# Patient Record
Sex: Female | Born: 1965
Health system: Southern US, Community
[De-identification: ages and names within clinical notes are randomized; demographics above are authoritative.]

## PROBLEM LIST (undated history)

## (undated) DIAGNOSIS — I83813 Varicose veins of bilateral lower extremities with pain: Secondary | ICD-10-CM

## (undated) DIAGNOSIS — L509 Urticaria, unspecified: Secondary | ICD-10-CM

## (undated) DIAGNOSIS — R0981 Nasal congestion: Secondary | ICD-10-CM

## (undated) DIAGNOSIS — T783XXA Angioneurotic edema, initial encounter: Secondary | ICD-10-CM

## (undated) DIAGNOSIS — E039 Hypothyroidism, unspecified: Secondary | ICD-10-CM

## (undated) DIAGNOSIS — K219 Gastro-esophageal reflux disease without esophagitis: Secondary | ICD-10-CM

## (undated) DIAGNOSIS — Z972 Presence of dental prosthetic device (complete) (partial): Secondary | ICD-10-CM

## (undated) DIAGNOSIS — K08109 Complete loss of teeth, unspecified cause, unspecified class: Secondary | ICD-10-CM

## (undated) DIAGNOSIS — Z8719 Personal history of other diseases of the digestive system: Secondary | ICD-10-CM

## (undated) DIAGNOSIS — I493 Ventricular premature depolarization: Secondary | ICD-10-CM

## (undated) DIAGNOSIS — N393 Stress incontinence (female) (male): Secondary | ICD-10-CM

## (undated) DIAGNOSIS — K859 Acute pancreatitis without necrosis or infection, unspecified: Secondary | ICD-10-CM

## (undated) DIAGNOSIS — J449 Chronic obstructive pulmonary disease, unspecified: Secondary | ICD-10-CM

## (undated) DIAGNOSIS — Z973 Presence of spectacles and contact lenses: Secondary | ICD-10-CM

## (undated) DIAGNOSIS — K8020 Calculus of gallbladder without cholecystitis without obstruction: Secondary | ICD-10-CM

## (undated) HISTORY — DX: Varicose veins of bilateral lower extremities with pain: I83.813

## (undated) HISTORY — PX: BREAST LUMPECTOMY: SHX2

## (undated) HISTORY — PX: BREAST EXCISIONAL BIOPSY: SUR124

## (undated) HISTORY — DX: Angioneurotic edema, initial encounter: T78.3XXA

## (undated) HISTORY — DX: Urticaria, unspecified: L50.9

## (undated) HISTORY — PX: TUBAL LIGATION: SHX77

## (undated) HISTORY — PX: BREAST BIOPSY: SHX20

## (undated) HISTORY — DX: Ventricular premature depolarization: I49.3

---

## 1997-11-27 ENCOUNTER — Other Ambulatory Visit: Admission: RE | Admit: 1997-11-27 | Discharge: 1997-11-27 | Payer: Self-pay | Admitting: *Deleted

## 1998-01-15 ENCOUNTER — Ambulatory Visit (HOSPITAL_BASED_OUTPATIENT_CLINIC_OR_DEPARTMENT_OTHER): Admission: RE | Admit: 1998-01-15 | Discharge: 1998-01-15 | Payer: Self-pay | Admitting: Surgery

## 1998-10-27 ENCOUNTER — Ambulatory Visit (HOSPITAL_COMMUNITY): Admission: RE | Admit: 1998-10-27 | Discharge: 1998-10-27 | Payer: Self-pay | Admitting: Internal Medicine

## 1998-10-28 ENCOUNTER — Encounter: Payer: Self-pay | Admitting: Internal Medicine

## 1999-01-01 ENCOUNTER — Other Ambulatory Visit: Admission: RE | Admit: 1999-01-01 | Discharge: 1999-01-01 | Payer: Self-pay | Admitting: Obstetrics and Gynecology

## 1999-02-10 ENCOUNTER — Ambulatory Visit (HOSPITAL_COMMUNITY): Admission: RE | Admit: 1999-02-10 | Discharge: 1999-02-10 | Payer: Self-pay | Admitting: *Deleted

## 1999-03-29 ENCOUNTER — Other Ambulatory Visit: Admission: RE | Admit: 1999-03-29 | Discharge: 1999-03-29 | Payer: Self-pay | Admitting: Urology

## 1999-05-01 ENCOUNTER — Emergency Department (HOSPITAL_COMMUNITY): Admission: EM | Admit: 1999-05-01 | Discharge: 1999-05-01 | Payer: Self-pay | Admitting: Emergency Medicine

## 1999-05-02 ENCOUNTER — Emergency Department (HOSPITAL_COMMUNITY): Admission: EM | Admit: 1999-05-02 | Discharge: 1999-05-02 | Payer: Self-pay | Admitting: *Deleted

## 1999-06-07 ENCOUNTER — Emergency Department (HOSPITAL_COMMUNITY): Admission: EM | Admit: 1999-06-07 | Discharge: 1999-06-07 | Payer: Self-pay

## 1999-06-29 ENCOUNTER — Ambulatory Visit (HOSPITAL_COMMUNITY): Admission: RE | Admit: 1999-06-29 | Discharge: 1999-06-29 | Payer: Self-pay | Admitting: *Deleted

## 1999-07-05 ENCOUNTER — Ambulatory Visit (HOSPITAL_COMMUNITY): Admission: RE | Admit: 1999-07-05 | Discharge: 1999-07-05 | Payer: Self-pay | Admitting: *Deleted

## 1999-08-14 ENCOUNTER — Emergency Department (HOSPITAL_COMMUNITY): Admission: EM | Admit: 1999-08-14 | Discharge: 1999-08-14 | Payer: Self-pay | Admitting: Emergency Medicine

## 1999-09-15 ENCOUNTER — Encounter: Payer: Self-pay | Admitting: Obstetrics and Gynecology

## 1999-09-15 ENCOUNTER — Encounter: Admission: RE | Admit: 1999-09-15 | Discharge: 1999-09-15 | Payer: Self-pay | Admitting: Obstetrics and Gynecology

## 2000-01-27 ENCOUNTER — Other Ambulatory Visit: Admission: RE | Admit: 2000-01-27 | Discharge: 2000-01-27 | Payer: Self-pay | Admitting: *Deleted

## 2000-05-13 ENCOUNTER — Ambulatory Visit (HOSPITAL_COMMUNITY): Admission: RE | Admit: 2000-05-13 | Discharge: 2000-05-13 | Payer: Self-pay

## 2000-09-22 ENCOUNTER — Encounter: Admission: RE | Admit: 2000-09-22 | Discharge: 2000-09-22 | Payer: Self-pay | Admitting: Obstetrics and Gynecology

## 2000-09-22 ENCOUNTER — Encounter: Payer: Self-pay | Admitting: Obstetrics and Gynecology

## 2001-04-12 ENCOUNTER — Other Ambulatory Visit: Admission: RE | Admit: 2001-04-12 | Discharge: 2001-04-12 | Payer: Self-pay | Admitting: Obstetrics and Gynecology

## 2001-11-13 ENCOUNTER — Encounter (INDEPENDENT_AMBULATORY_CARE_PROVIDER_SITE_OTHER): Payer: Self-pay | Admitting: Specialist

## 2001-11-13 ENCOUNTER — Observation Stay (HOSPITAL_COMMUNITY): Admission: RE | Admit: 2001-11-13 | Discharge: 2001-11-14 | Payer: Self-pay | Admitting: Obstetrics and Gynecology

## 2001-11-13 HISTORY — PX: VAGINAL HYSTERECTOMY: SHX2639

## 2003-04-22 ENCOUNTER — Encounter: Payer: Self-pay | Admitting: Internal Medicine

## 2003-04-22 ENCOUNTER — Encounter: Admission: RE | Admit: 2003-04-22 | Discharge: 2003-04-22 | Payer: Self-pay | Admitting: Internal Medicine

## 2003-04-28 ENCOUNTER — Encounter: Admission: RE | Admit: 2003-04-28 | Discharge: 2003-04-28 | Payer: Self-pay | Admitting: Internal Medicine

## 2003-04-28 ENCOUNTER — Encounter: Payer: Self-pay | Admitting: Internal Medicine

## 2003-05-27 ENCOUNTER — Encounter: Payer: Self-pay | Admitting: Internal Medicine

## 2003-05-27 ENCOUNTER — Encounter: Admission: RE | Admit: 2003-05-27 | Discharge: 2003-05-27 | Payer: Self-pay | Admitting: Internal Medicine

## 2004-07-26 ENCOUNTER — Encounter: Admission: RE | Admit: 2004-07-26 | Discharge: 2004-07-26 | Payer: Self-pay | Admitting: Internal Medicine

## 2006-11-28 ENCOUNTER — Encounter: Admission: RE | Admit: 2006-11-28 | Discharge: 2006-11-28 | Payer: Self-pay | Admitting: Internal Medicine

## 2006-12-05 ENCOUNTER — Encounter: Admission: RE | Admit: 2006-12-05 | Discharge: 2006-12-05 | Payer: Self-pay | Admitting: Internal Medicine

## 2006-12-08 ENCOUNTER — Encounter: Admission: RE | Admit: 2006-12-08 | Discharge: 2006-12-08 | Payer: Self-pay | Admitting: Internal Medicine

## 2006-12-08 ENCOUNTER — Encounter (INDEPENDENT_AMBULATORY_CARE_PROVIDER_SITE_OTHER): Payer: Self-pay | Admitting: Specialist

## 2007-11-29 ENCOUNTER — Encounter: Admission: RE | Admit: 2007-11-29 | Discharge: 2007-11-29 | Payer: Self-pay | Admitting: Internal Medicine

## 2008-05-02 ENCOUNTER — Ambulatory Visit (HOSPITAL_COMMUNITY): Admission: RE | Admit: 2008-05-02 | Discharge: 2008-05-02 | Payer: Self-pay | Admitting: Urology

## 2008-12-17 ENCOUNTER — Encounter: Admission: RE | Admit: 2008-12-17 | Discharge: 2008-12-17 | Payer: Self-pay | Admitting: Internal Medicine

## 2010-08-06 ENCOUNTER — Encounter
Admission: RE | Admit: 2010-08-06 | Discharge: 2010-08-06 | Payer: Self-pay | Source: Home / Self Care | Attending: Family Medicine | Admitting: Family Medicine

## 2010-08-30 ENCOUNTER — Encounter: Payer: Self-pay | Admitting: Internal Medicine

## 2010-12-24 NOTE — Op Note (Signed)
[facility]  Patient:    Dana Martin, Dana Martin Visit Number: [id number] MRN: [id number]          Service Type: DSU Location: [id number] Attending Physician:  Jenean Lindau Dictated by:   Laqueta Linden, M.D. Proc. Date: 11/13/01 Admit Date:  11/13/2001 Discharge Date: 11/14/2001                             Operative Report  PREOPERATIVE DIAGNOSIS:       Leiomyoma uteri, menorrhagia, rule out                               adenomyosis.  POSTOPERATIVE DIAGNOSIS:      Leiomyoma uteri, menorrhagia, rule out                               adenomyosis.  OPERATION:                    Transvaginal hysterectomy.  SURGEON:                      Laqueta Linden, M.D.  ASSISTANT:                    Andres Ege, M.D.  ANESTHESIA:                   General LMA.  ESTIMATED BLOOD LOSS:         Less than 50 cc.  URINE OUTPUT:                 65 cc of clear urine and in and out                               catheterization at the conclusion of the                               procedure.  COUNTS:                       Correct x2.  COMPLICATIONS:                None.  INDICATIONS:                  The patient is a 45 year old gravida 2, para 2, married white female with worsening menorrhagia.  She reports that her menses have been lasting eight to nine days which are profuse for three of those days, changing an overnight pad every two hours during the day and saturating these pads.  She wears two pads at bed time and soaks through to her bed clothes.  Evaluation has included a pelvic ultrasound revealed a 5 cm fundal fibroid with normal-appearing ovaries.  Sonohistogram does not reveal a submucosal component of the fibroid that might be amenable to conservative resection.  There is a small synechiae with no evidence of polyps.  She has tried the progesterone only pill with no response in her bleeding pattern. She also reports worsening pelvic pain  with her menses.  She is not a candidate for combination pills as she is a smoker.  Alternative management has  been discussed including uterine artery embolization, endometrial ablation by various methods versus vaginal hysterectomy.  She elected to proceed to hysterectomy as she is complaining of worsening pelvic pressure suprapubically from the myoma.  She has seen the informed consent film and voiced her understanding of all risks, benefits, alternatives, and complications, and agrees to proceed.  Full consent has been given.  She has received Ancef 1 g IV antibiotic prophylaxis preoperatively.  DESCRIPTION OF PROCEDURE:     The patient was taken to the operating room and after proper identification and consent were ascertained, she was placed on the operating table in the supine position.  After the induction of general LMA, she was placed in the candy cane stirrups, and the perineum and vagina were prepped and draped in the routine sterile fashion.  The bladder was emptied with a red rubber catheter.  A weighted speculum was placed in the posterior vagina.  The cervix was grasped with a single tooth tenaculum with a minimal amount of descent noted.  The portio was then injected with a 1:100,000 solution of epinephrine circumferentially.  The cervix was then circumscribed with the scalpel.  The vaginal mucosa was then bluntly advanced off of the cervix.  The posterior mucosa was then tinted down and the cul-de-sac entered sharply using curved Mayo scissors.  The uterosacral ligaments were clamped, cut, and Heaney ligated with 0 Vicryl suture and tagged.  Cardinal ligaments with included vessels were similarly clamped, cut, and suture ligated.  At this point, improved descent of the cervical uterine complex was noted. The anterior peritoneal reflection was identified and entered sharply without obvious injury or entry into the bladder.  A retractor was placed anteriorly to retract the  bladder out of the operative field.  Uterine vessels were then clamped bilaterally with pedicles incorporating both anterior and posterior peritoneum with pedicles cut and suture ligated.  At this point, an attempt was made to flip the uterus posteriorly.  This was hindered by the large fundal myoma.  As the uterine vasculature had been ligated, the cervix was then cored out of the uterus and transected which decreased the overall bulk of the uterine fundus.  At this point, the fundus with contained fibroid was delivered posteriorly. The proximal adnexal pedicles were then clamped and cut with removal of the remainder of the specimen which was sent in its entirety to pathology.  At this point, the adnexal pedicles were triply ligated with two free ties and a stitch of 0 Vicryl.  Both tubes and ovaries appeared completely normal.  The pedicles were noted to be hemostatic and were released in the peritoneal cavity.  At this point, the posterior vaginal cuff was reefed to the peritoneum, taking care to avoid the rectum which was in somewhat close proximity to this closure.  Care was taken to only take small superficial areas of peritoneum, so as not to enter the rectum.  This closure improved hemostasis.  At this point, the parietal peritoneum was closed in a pursestring fashion using 2-0 Vicryl suture.  All pedicles were exteriorized.  Hemostasis was noted to be excellent.  Counts were correct prior to closing the peritoneum. The uterosacral tags were then tied in the midline.  Upper vaginal support was noted to be excellent.  The vaginal cuff was then closed from side-to-side using interrupted figure-of-eight sutures of 0 Vicryl.  Hemostasis was noted to be excellent.  The bladder was then emptied of 65 cc of clear urine which had accumulated during the less  than one hour procedure.  This was clear urine.  The cuff was hemostatic.  No packing was left in place.  The patient was  returned to the supine position.  She was extubated and stable on transfer to the recovery room.  Estimated blood loss 50 cc or less.  Urine  output 65 cc.  Counts correct x2.  Complications none. Dictated by:   Laqueta Linden, M.D. Attending Physician:  Jenean Lindau DD:  11/13/01 TD:  11/13/01 Job: [id number]

## 2011-04-19 ENCOUNTER — Emergency Department (HOSPITAL_COMMUNITY): Payer: Self-pay

## 2011-04-19 ENCOUNTER — Emergency Department (HOSPITAL_COMMUNITY)
Admission: EM | Admit: 2011-04-19 | Discharge: 2011-04-19 | Disposition: A | Discharge status: Home / Self Care | Payer: Self-pay | Attending: Emergency Medicine | Admitting: Emergency Medicine

## 2011-04-19 DIAGNOSIS — R1013 Epigastric pain: Secondary | ICD-10-CM | POA: Insufficient documentation

## 2011-04-19 DIAGNOSIS — K219 Gastro-esophageal reflux disease without esophagitis: Secondary | ICD-10-CM | POA: Insufficient documentation

## 2011-04-19 DIAGNOSIS — R1011 Right upper quadrant pain: Secondary | ICD-10-CM | POA: Insufficient documentation

## 2011-04-19 DIAGNOSIS — K859 Acute pancreatitis without necrosis or infection, unspecified: Secondary | ICD-10-CM | POA: Insufficient documentation

## 2011-04-19 DIAGNOSIS — E0500 Thyrotoxicosis with diffuse goiter without thyrotoxic crisis or storm: Secondary | ICD-10-CM | POA: Insufficient documentation

## 2011-04-19 LAB — DIFFERENTIAL
Basophils Absolute: 0.1 K/uL (ref 0.0–0.1)
Basophils Relative: 0 % (ref 0–1)
Eosinophils Absolute: 0.2 K/uL (ref 0.0–0.7)
Eosinophils Relative: 2 % (ref 0–5)
Lymphocytes Relative: 19 % (ref 12–46)
Lymphs Abs: 2.3 10*3/uL (ref 0.7–4.0)
Monocytes Absolute: 1 10*3/uL (ref 0.1–1.0)
Monocytes Relative: 8 % (ref 3–12)
Neutro Abs: 8.7 10*3/uL — ABNORMAL HIGH (ref 1.7–7.7)
Neutrophils Relative %: 71 % (ref 43–77)

## 2011-04-19 LAB — URINALYSIS, ROUTINE W REFLEX MICROSCOPIC
Glucose, UA: NEGATIVE mg/dL
Ketones, ur: 40 mg/dL — AB
Leukocytes, UA: NEGATIVE
Nitrite: NEGATIVE
Urobilinogen, UA: 1 mg/dL (ref 0.0–1.0)
pH: 6.5 (ref 5.0–8.0)

## 2011-04-19 LAB — CBC
HCT: 35.2 % — ABNORMAL LOW (ref 36.0–46.0)
Hemoglobin: 11.6 g/dL — ABNORMAL LOW (ref 12.0–15.0)
MCH: 27.8 pg (ref 26.0–34.0)
MCHC: 33 g/dL (ref 30.0–36.0)
MCV: 84.2 fL (ref 78.0–100.0)
Platelets: 162 10*3/uL (ref 150–400)
RBC: 4.18 MIL/uL (ref 3.87–5.11)
RDW: 14.7 % (ref 11.5–15.5)
WBC: 12.3 10*3/uL — ABNORMAL HIGH (ref 4.0–10.5)

## 2011-04-19 LAB — COMPREHENSIVE METABOLIC PANEL WITH GFR
BUN: 5 mg/dL — ABNORMAL LOW (ref 6–23)
Chloride: 99 meq/L (ref 96–112)
Sodium: 136 meq/L (ref 135–145)
Total Bilirubin: 0.4 mg/dL (ref 0.3–1.2)
Total Protein: 7.6 g/dL (ref 6.0–8.3)

## 2011-04-19 LAB — COMPREHENSIVE METABOLIC PANEL
ALT: 11 U/L (ref 0–35)
AST: 14 U/L (ref 0–37)
Albumin: 3.3 g/dL — ABNORMAL LOW (ref 3.5–5.2)
Alkaline Phosphatase: 73 U/L (ref 39–117)
CO2: 28 mEq/L (ref 19–32)
Calcium: 9.3 mg/dL (ref 8.4–10.5)
Creatinine, Ser: <0.47 mg/dL — ABNORMAL LOW (ref 0.50–1.10)
Glucose, Bld: 71 mg/dL (ref 70–99)
Potassium: 3.7 mEq/L (ref 3.5–5.1)

## 2011-04-19 LAB — URINE MICROSCOPIC-ADD ON

## 2011-04-19 LAB — POCT PREGNANCY, URINE: Preg Test, Ur: NEGATIVE

## 2011-04-19 LAB — LIPASE, BLOOD: Lipase: 84 U/L — ABNORMAL HIGH (ref 11–59)

## 2012-03-15 ENCOUNTER — Emergency Department (HOSPITAL_COMMUNITY)
Admission: EM | Admit: 2012-03-15 | Discharge: 2012-03-15 | Disposition: A | Discharge status: Home / Self Care | Payer: 59 | Attending: Emergency Medicine | Admitting: Emergency Medicine

## 2012-03-15 ENCOUNTER — Encounter (HOSPITAL_COMMUNITY): Payer: Self-pay | Admitting: Emergency Medicine

## 2012-03-15 DIAGNOSIS — L259 Unspecified contact dermatitis, unspecified cause: Secondary | ICD-10-CM

## 2012-03-15 DIAGNOSIS — M542 Cervicalgia: Secondary | ICD-10-CM | POA: Insufficient documentation

## 2012-03-15 DIAGNOSIS — E0500 Thyrotoxicosis with diffuse goiter without thyrotoxic crisis or storm: Secondary | ICD-10-CM | POA: Insufficient documentation

## 2012-03-15 DIAGNOSIS — Z87891 Personal history of nicotine dependence: Secondary | ICD-10-CM | POA: Insufficient documentation

## 2012-03-15 DIAGNOSIS — Z79899 Other long term (current) drug therapy: Secondary | ICD-10-CM | POA: Insufficient documentation

## 2012-03-15 DIAGNOSIS — S161XXA Strain of muscle, fascia and tendon at neck level, initial encounter: Secondary | ICD-10-CM

## 2012-03-15 MED ORDER — CYCLOBENZAPRINE HCL 5 MG PO TABS: 5.0000 mg | ORAL_TABLET | Freq: Three times a day (TID) | ORAL | Status: AC | PRN

## 2012-03-15 MED ORDER — HYDROCODONE-ACETAMINOPHEN 5-325 MG PO TABS: ORAL_TABLET | ORAL | Status: AC

## 2012-03-15 MED ORDER — PREDNISONE 10 MG PO TABS: ORAL_TABLET | ORAL | Status: DC

## 2012-03-15 NOTE — ED Notes (Signed)
Neck felt like "pinched nerve" started Friday night. Went to Chiropractor Monday, Tuesday, Wednesday, had xray done. Rash started Tuesday on mid chest- blistery looking- denies being exposed to poison ivy. Rash doesn't itch- doesn't hurt.

## 2012-03-15 NOTE — ED Provider Notes (Signed)
History     CSN: [id number]  Arrival date & time 03/15/12  4098   First MD Initiated Contact with Patient 03/15/12 1004      Chief Complaint  Patient presents with  . Neck Pain  . rash on neck     HPI Pt was seen at 1010.  Per pt, c/o gradual onset and persistence of constant L>R sided neck "pain" for the past 1 week.  Pt states the pain began after she was "pulling a heavy couch." Pt was eval by her Chiropractor this past week for same, told she had a "pinched nerve."  States she came to the ED for a pain meds rx because she has been unable to get an appt with her PMD for it.  Pt also c/o localized "red rash" to her anterior chest that began 2 days ago "after the dog jumped up on me after being outside."  Pt denies any other areas of rash, no fevers, no CP/SOB.  Denies headache, no visual changes, no focal motor deficits, no tingling/numbness in extremities.     Past Medical History  Diagnosis Date  . Graves disease     Past Surgical History  Procedure Date  . Abdominal hysterectomy   . Breast lumpectomy     benign -- fibroid    History  Substance Use Topics  . Smoking status: Former Games developer  . Smokeless tobacco: Not on file  . Alcohol Use:      occasionally    Review of Systems ROS: Statement: All systems negative except as marked or noted in the HPI; Constitutional: Negative for fever and chills. ; ; Eyes: Negative for eye pain, redness and discharge. ; ; ENMT: Negative for ear pain, hoarseness, nasal congestion, sinus pressure and sore throat. ; ; Cardiovascular: Negative for chest pain, palpitations, diaphoresis, dyspnea and peripheral edema. ; ; Respiratory: Negative for cough, wheezing and stridor. ; ; Gastrointestinal: Negative for nausea, vomiting, diarrhea, abdominal pain, blood in stool, hematemesis, jaundice and rectal bleeding.; ; Genitourinary: Negative for dysuria, flank pain and hematuria. ; ; Musculoskeletal: +neck pain. Negative for back pain. Negative for  swelling and trauma.; ; Skin: +rash. Negative for pruritus, abrasions, blisters, bruising and skin lesion.; ; Neuro: Negative for headache, lightheadedness and neck stiffness. Negative for weakness, altered level of consciousness , altered mental status, extremity weakness, paresthesias, involuntary movement, seizure and syncope.     Allergies  Review of patient's allergies indicates no known allergies.  Home Medications   Current Outpatient Rx  Name Route Sig Dispense Refill  . CETIRIZINE HCL 10 MG PO TABS Oral Take 10 mg by mouth daily.    Marland Kitchen LANSOPRAZOLE 30 MG PO CPDR Oral Take 30 mg by mouth daily.    Marland Kitchen LEVOTHYROXINE SODIUM 175 MCG PO TABS Oral Take 175 mcg by mouth daily.      BP 150/103  Pulse 96  Temp 97 F (36.1 C)  Resp 18  SpO2 100%  Physical Exam 1015: Physical examination:  Nursing notes reviewed; Vital signs and O2 SAT reviewed;  Constitutional: Well developed, Well nourished, Well hydrated, In no acute distress; Head:  Normocephalic, atraumatic; Eyes: EOMI, PERRL, No scleral icterus; ENMT: Mouth and pharynx normal, Mucous membranes moist; Neck: Supple, Full range of motion, No lymphadenopathy; Cardiovascular: Regular rate and rhythm, No murmur, rub, or gallop; Respiratory: Breath sounds clear & equal bilaterally, No rales, rhonchi, wheezes.  Speaking full sentences with ease, Normal respiratory effort/excursion; Chest: Nontender, Movement normal; Spine:  No midline CS, TS, LS  tenderness.  +TTP left hypertonic trapezius muscle; Extremities: Pulses normal, No tenderness, No edema, No calf edema or asymmetry.; Neuro: AA&Ox3, Major CN grossly intact.  Speech clear. Grips equal.  Strength 5/5 bilat UE's.  DTR 2/4 equal bilat UE's. Gait steady. No gross focal motor or sensory deficits in extremities.; Skin: Color normal, Warm, Dry, +erythemetous maculopapular rash to upper chest wall, no drainage, no crusting, no vesicles.    ED Course  Procedures    MDM  MDM Reviewed:  nursing note, vitals and previous chart Reviewed previous: MRI     1020:  Previous MRI CS from 2001 with with mild disc bulge.  No gross focal motor or sensory weakness on neuro exam (esp bilat UE's), visual changes, or headache to suggest arterial injury from manipulation or acute herniated disk.  No indication for repeat imaging at this time, will tx symptomatically.  Rash began after pet came in from outside and jumped up on her, will tx for contact dermatitis.  Dx d/w pt.  Questions answered.  Verb understanding, agreeable to d/c home with outpt f/u.      No midlevel was involved in the care of this pt. Laylamarie Meuser Allison Quarry, DO 03/17/12 1524

## 2013-05-08 ENCOUNTER — Emergency Department (HOSPITAL_COMMUNITY): Payer: 59

## 2013-05-08 ENCOUNTER — Inpatient Hospital Stay (HOSPITAL_COMMUNITY)
Admission: EM | Admit: 2013-05-08 | Discharge: 2013-05-10 | DRG: 439 | Disposition: A | Discharge status: Home / Self Care | Payer: 59 | Attending: Internal Medicine | Admitting: Internal Medicine

## 2013-05-08 ENCOUNTER — Encounter (HOSPITAL_COMMUNITY): Payer: Self-pay | Admitting: Emergency Medicine

## 2013-05-08 DIAGNOSIS — E871 Hypo-osmolality and hyponatremia: Secondary | ICD-10-CM | POA: Diagnosis present

## 2013-05-08 DIAGNOSIS — K219 Gastro-esophageal reflux disease without esophagitis: Secondary | ICD-10-CM | POA: Diagnosis present

## 2013-05-08 DIAGNOSIS — E039 Hypothyroidism, unspecified: Secondary | ICD-10-CM | POA: Diagnosis present

## 2013-05-08 DIAGNOSIS — F17200 Nicotine dependence, unspecified, uncomplicated: Secondary | ICD-10-CM | POA: Diagnosis present

## 2013-05-08 DIAGNOSIS — D72829 Elevated white blood cell count, unspecified: Secondary | ICD-10-CM

## 2013-05-08 DIAGNOSIS — K859 Acute pancreatitis without necrosis or infection, unspecified: Principal | ICD-10-CM | POA: Diagnosis present

## 2013-05-08 DIAGNOSIS — D696 Thrombocytopenia, unspecified: Secondary | ICD-10-CM | POA: Diagnosis present

## 2013-05-08 HISTORY — DX: Acute pancreatitis without necrosis or infection, unspecified: K85.90

## 2013-05-08 LAB — CBC WITH DIFFERENTIAL/PLATELET
MCHC: 33.1 g/dL (ref 30.0–36.0)
MCV: 82.9 fL (ref 78.0–100.0)
RDW: 15.2 % (ref 11.5–15.5)

## 2013-05-08 LAB — POCT I-STAT, CHEM 8
BUN: 5 mg/dL — ABNORMAL LOW (ref 6–23)
Creatinine, Ser: 0.7 mg/dL (ref 0.50–1.10)
Glucose, Bld: 100 mg/dL — ABNORMAL HIGH (ref 70–99)
Sodium: 139 mEq/L (ref 135–145)
TCO2: 28 mmol/L (ref 0–100)

## 2013-05-08 LAB — URINALYSIS, ROUTINE W REFLEX MICROSCOPIC
Bilirubin Urine: NEGATIVE
Protein, ur: NEGATIVE mg/dL
Specific Gravity, Urine: 1.026 (ref 1.005–1.030)
Urobilinogen, UA: 1 mg/dL (ref 0.0–1.0)
pH: 6 (ref 5.0–8.0)

## 2013-05-08 LAB — URINE MICROSCOPIC-ADD ON

## 2013-05-08 LAB — HEPATIC FUNCTION PANEL
ALT: 10 U/L (ref 0–35)
Albumin: 3.9 g/dL (ref 3.5–5.2)
Total Protein: 8 g/dL (ref 6.0–8.3)

## 2013-05-08 LAB — LIPASE, BLOOD: Lipase: 24 U/L (ref 11–59)

## 2013-05-08 MED ORDER — POLYETHYLENE GLYCOL 3350 17 G PO PACK
17.0000 g | PACK | Freq: Every day | ORAL | Status: DC | PRN
  Filled 2013-05-08: qty 1

## 2013-05-08 MED ORDER — ONDANSETRON HCL 4 MG/2ML IJ SOLN: 4.0000 mg | Freq: Four times a day (QID) | INTRAMUSCULAR | Status: DC | PRN

## 2013-05-08 MED ORDER — ACETAMINOPHEN 650 MG RE SUPP: 650.0000 mg | Freq: Four times a day (QID) | RECTAL | Status: DC | PRN

## 2013-05-08 MED ORDER — PANTOPRAZOLE SODIUM 40 MG PO TBEC
40.0000 mg | DELAYED_RELEASE_TABLET | Freq: Every day | ORAL | Status: DC
  Administered 2013-05-09 – 2013-05-10 (×2): 40 mg via ORAL
  Filled 2013-05-08 (×2): qty 1

## 2013-05-08 MED ORDER — SODIUM CHLORIDE 0.9 % IV BOLUS (SEPSIS)
1000.0000 mL | Freq: Once | INTRAVENOUS | Status: AC
  Administered 2013-05-08: 1000 mL via INTRAVENOUS

## 2013-05-08 MED ORDER — HYDROMORPHONE HCL PF 1 MG/ML IJ SOLN: 1.0000 mg | INTRAMUSCULAR | Status: DC | PRN

## 2013-05-08 MED ORDER — DOCUSATE SODIUM 100 MG PO CAPS
100.0000 mg | ORAL_CAPSULE | Freq: Two times a day (BID) | ORAL | Status: DC
  Administered 2013-05-09 – 2013-05-10 (×3): 100 mg via ORAL
  Filled 2013-05-08 (×5): qty 1

## 2013-05-08 MED ORDER — ONDANSETRON HCL 4 MG/2ML IJ SOLN
4.0000 mg | Freq: Once | INTRAMUSCULAR | Status: DC
  Filled 2013-05-08: qty 2

## 2013-05-08 MED ORDER — SORBITOL 70 % SOLN
30.0000 mL | Freq: Every day | Status: DC | PRN
  Filled 2013-05-08: qty 30

## 2013-05-08 MED ORDER — SODIUM CHLORIDE 0.9 % IV SOLN
INTRAVENOUS | Status: DC
  Administered 2013-05-08: 22:00:00 via INTRAVENOUS
  Administered 2013-05-10: 1000 mL via INTRAVENOUS

## 2013-05-08 MED ORDER — ONDANSETRON HCL 4 MG/2ML IJ SOLN: 4.0000 mg | Freq: Three times a day (TID) | INTRAMUSCULAR | Status: DC | PRN

## 2013-05-08 MED ORDER — ONDANSETRON HCL 4 MG PO TABS: 4.0000 mg | ORAL_TABLET | Freq: Four times a day (QID) | ORAL | Status: DC | PRN

## 2013-05-08 MED ORDER — ACETAMINOPHEN 325 MG PO TABS: 650.0000 mg | ORAL_TABLET | Freq: Four times a day (QID) | ORAL | Status: DC | PRN

## 2013-05-08 MED ORDER — MORPHINE SULFATE 2 MG/ML IJ SOLN: 2.0000 mg | INTRAMUSCULAR | Status: DC | PRN

## 2013-05-08 MED ORDER — LEVOTHYROXINE SODIUM 100 MCG IV SOLR
80.0000 ug | Freq: Every day | INTRAVENOUS | Status: DC
  Administered 2013-05-09 – 2013-05-10 (×2): 80 ug via INTRAVENOUS
  Filled 2013-05-08 (×2): qty 5

## 2013-05-08 MED ORDER — HEPARIN SODIUM (PORCINE) 5000 UNIT/ML IJ SOLN
5000.0000 [IU] | Freq: Three times a day (TID) | INTRAMUSCULAR | Status: DC
  Administered 2013-05-09: 5000 [IU] via SUBCUTANEOUS
  Filled 2013-05-08 (×5): qty 1

## 2013-05-08 MED ORDER — LORATADINE 10 MG PO TABS
10.0000 mg | ORAL_TABLET | Freq: Every day | ORAL | Status: DC
  Administered 2013-05-09 – 2013-05-10 (×2): 10 mg via ORAL
  Filled 2013-05-08 (×2): qty 1

## 2013-05-08 NOTE — ED Provider Notes (Signed)
  This was a shared visit with a mid-level provided (NP or PA).  Throughout the patient's course I was available for consultation/collaboration.  I saw the ECG (if appropriate), relevant labs and studies - I agree with the interpretation.  On my exam the patient was in no distress.  She tolerated the ultrasound procedure without decompensation, but with concern for ongoing epigastric discomfort, required CT scan. With concern for pancreatitis, she was admitted for further E/M.      Gerhard Munch, MD 05/09/13 0000

## 2013-05-08 NOTE — ED Notes (Signed)
C/o severe abdominal pain to abdomen radiating to back, states has had this pain before and was diagnosed with pancreatitis, nausea, no vomiting, pain worse with eating, pain started last pm after dinner

## 2013-05-08 NOTE — ED Provider Notes (Signed)
CSN: [id number]     Arrival date & time 05/08/13  1421 History   First MD Initiated Contact with Patient 05/08/13 1540     Chief Complaint  Patient presents with  . Abdominal Pain   (Consider location/radiation/quality/duration/timing/severity/associated sxs/prior Treatment) HPI  47 year old female with history of Graves' disease and history of pancreatitis presents complaining of abdominal pain. Patient reports gradual onset of intermittent sharp achy pain to the right upper quadrant radiates to back, which started last night after she had dinner. States she ate a shepherd's pie last nite. The pain has been watching waning, improved with taking Advil and worsening with eating. She had some crackers today. The patient states she had similar pain last year when she was diagnosed with pancreatitis that it's not related to diabetes or alcohol use. Her pain is right now 5/10 after taking Advil. She denies fever, chills, headache, chest pain, shortness of breath, productive cough, hemoptysis, dysuria, hematuria, hematochezia. Her last bowel movement was yesterday. She endorsed nausea without vomiting or diarrhea. She denies any recent alcohol use. She denies any medication changes. When offer pain medication patient declined.  Past Medical History  Diagnosis Date  . Graves disease   . Pancreatitis    Past Surgical History  Procedure Laterality Date  . Abdominal hysterectomy    . Breast lumpectomy      benign -- fibroid   History reviewed. No pertinent family history. History  Substance Use Topics  . Smoking status: Current Every Day Smoker  . Smokeless tobacco: Not on file  . Alcohol Use: Yes     Comment: rarely   OB History   Grav Para Term Preterm Abortions TAB SAB Ect Mult Living                 Review of Systems  All other systems reviewed and are negative.    Allergies  Review of patient's allergies indicates no known allergies.  Home Medications   Current Outpatient Rx   Name  Route  Sig  Dispense  Refill  . cetirizine (ZYRTEC) 10 MG tablet   Oral   Take 10 mg by mouth daily.         . lansoprazole (PREVACID) 30 MG capsule   Oral   Take 30 mg by mouth daily.         Marland Kitchen levothyroxine (SYNTHROID, LEVOTHROID) 175 MCG tablet   Oral   Take 175 mcg by mouth daily.         . predniSONE (DELTASONE) 10 MG tablet      Take 5 tablets PO x2 days, then take 4 tabs PO x3 days, then 3 tabs PO x3 days, then 2 tabs PO x3 days, then 1 tab PO x3 days   40 tablet   0    BP 140/100  Pulse 106  Temp(Src) 98.1 F (36.7 C)  Resp 16  SpO2 97% Physical Exam  Nursing note and vitals reviewed. Constitutional: She is oriented to person, place, and time. She appears well-developed and well-nourished. No distress.  Awake, alert, nontoxic appearance  HENT:  Head: Atraumatic.  Eyes: Conjunctivae are normal. Right eye exhibits no discharge. Left eye exhibits no discharge.  Neck: Neck supple.  Cardiovascular: Normal rate and regular rhythm.   Pulmonary/Chest: Effort normal. No respiratory distress. She exhibits no tenderness.  Abdominal: Soft. There is tenderness (Epigastric and right upper quadrant tenderness on palpation without guarding or rebound tenderness. Negative Murphy's sign. No McBurney's point. No hernia noted.). There is no rebound.  Genitourinary:  No CVA tenderness.  Musculoskeletal: She exhibits no tenderness.  ROM appears intact, no obvious focal weakness  Neurological: She is alert and oriented to person, place, and time.  Skin: No rash noted.  Psychiatric: She has a normal mood and affect.    ED Course  Procedures (including critical care time)   Date: 05/08/2013  Rate: 83  Rhythm: normal sinus rhythm  QRS Axis: right, borderline  Intervals: normal  ST/T Wave abnormalities: normal  Conduction Disutrbances:none  Narrative Interpretation:   Old EKG Reviewed: unchanged    4:06 PM Patient with right upper quadrant abdominal pain  concerning for biliary disease. She also has epigastric tenderness and does admits to using NSAIDs. Patient does not think taking NSAIDs is worsening of symptoms. No chest pain shortness of breath concern for cardiopulmonary disease. I did offer for pain medication the patient declined. I also offered for antinausea medication the patient declined as well. Will obtain abdominal ultrasound for further evaluation along with lab work. We'll continue to watch patient.  5:33 PM Abdominal ultrasounds without any evidence of biliary disease. Patient does have a leukocytosis with white count of 14. Will obtain abdominal and pelvis CT scan for further evaluation.  7:40 PM CT scan shows evidence of acute pancreatitis although patient has normal lipase.  Patient also has blood in the urine, however she states this is normal for her. She is not currently on her menstruation. She does not know why she has blood in the urine. Due to the finding of elevated lipase and evidence of acute pancreatitis, will consult for  Labs Review Labs Reviewed  CBC WITH DIFFERENTIAL - Abnormal; Notable for the following:    WBC 14.0 (*)    All other components within normal limits  URINALYSIS, ROUTINE W REFLEX MICROSCOPIC - Abnormal; Notable for the following:    Color, Urine AMBER (*)    APPearance CLOUDY (*)    Hgb urine dipstick LARGE (*)    All other components within normal limits  POCT I-STAT, CHEM 8 - Abnormal; Notable for the following:    BUN 5 (*)    Glucose, Bld 100 (*)    All other components within normal limits  HEPATIC FUNCTION PANEL  LIPASE, BLOOD  URINE MICROSCOPIC-ADD ON   Imaging Review Ct Abdomen Pelvis Wo Contrast  05/08/2013   CLINICAL DATA:  Severe abdominal pain.  EXAM: CT ABDOMEN AND PELVIS WITHOUT CONTRAST  TECHNIQUE: Multidetector CT imaging of the abdomen and pelvis was performed following the standard protocol without intravenous contrast.  COMPARISON:  05/02/2008.  FINDINGS: The lung bases  are clear. No pleural effusion or pericardial effusion. A benign appearing smoothly marginated and well circumscribed right breast nodule with calcification is noted. No change since prior CT.  The liver is unremarkable without contrast. There is a stable simple appearing right hepatic lobe cyst near the gallbladder. No biliary dilatation. The gallbladder is normal. No common bile duct dilatation. The pancreas demonstrates inflammatory changes. This is mainly in the pancreatic head and body regions. Findings consistent with acute pancreatitis. The spleen is normal in size. No focal lesions. The adrenal glands are normal. A upper pole right renal calculus is noted. No obstructing ureteral calculi or bladder calculi. A left-sided parapelvic cyst is noted.  The stomach, duodenum, small bowel and colon are unremarkable except for advanced diverticulosis involving the upper sigmoid colon and distal descending colon. No findings for acute diverticulitis. The appendix is normal. No mesenteric or retroperitoneal mass or adenopathy. The aorta  is normal.  The uterus is surgically absent. The ovaries are still present and appear normal. There are small cyst associated with the left ovary. The bladder is normal. No pelvic mass, adenopathy or free pelvic fluid collections. No inguinal mass or adenopathy.  The bony structures are unremarkable.  IMPRESSION: CT findings suggest acute pancreatitis. Recommend correlation with amylase and lipase levels.  Upper pole right renal calculus but no obstructing ureteral calculi.  Stable right hepatic lobe cyst.  Small cysts associated with the left ovary.  Diverticulosis of the upper sigmoid colon but no findings for acute diverticulitis.   Electronically Signed   By: Loralie Champagne M.D.   On: 05/08/2013 18:42   US Abdomen Complete  05/08/2013   CLINICAL DATA:  Abdominal pain  EXAM: ULTRASOUND ABDOMEN COMPLETE  COMPARISON:  April 19, 2011  FINDINGS: Gallbladder  No gallstones or wall  thickening. There is no pericholecystic fluid. Negative sonographic Murphy's sign.  Common bile duct  Diameter: 4 mm. There is no intrahepatic, common hepatic, or common bile duct dilatation.  Liver  There is a cyst in the anterior segment of the right lobe of the liver measuring 3.0 x 2.5 x 2.6 cm. This cyst was noted previously and is not appear appreciably changed. No new liver lesions are identified. Liver echogenicity is within normal limits.  IVC  No abnormality visualized.  Pancreas  No pancreatic lesions identified.  Spleen  Size and appearance within normal limits.  Right Kidney  Length: 11.9 cm. Echogenicity within normal limits. No mass, pelvicaliectasis, or perinephric fluid visualized.  Left Kidney  Length: 10.4 cm. Echogenicity within normal limits. There is a 1.3 x 1.3 x 1.7 cm cyst in the lower pole region. No other renal mass. The no pelvicaliectasis or perinephric fluid.  Abdominal aorta  No aneurysm visualized.  IMPRESSION: Stable hepatic and left renal cysts. Study otherwise unremarkable and stable.   Electronically Signed   By: Bretta Bang   On: 05/08/2013 16:46    MDM   1. Pancreatitis    BP 140/100  Pulse 106  Temp(Src) 98.1 F (36.7 C)  Resp 16  SpO2 97%  I have reviewed nursing notes and vital signs. I personally reviewed the imaging tests through PACS system  I reviewed available ER/hospitalization records thought the EMR     Fayrene Helper, [place] 05/08/13 2004

## 2013-05-08 NOTE — H&P (Signed)
[facility] History and Physical  [name] JXB:[id number] DOB: Aug 27, 1965 DOA: 05/08/2013  Referring physician: Emergency Department PCP: Lajuana Carry, MD  Specialists:   Chief Complaint: Abdominal pain  HPI: Dana Martin is a 47 y.o. female  Who presents to the ED with complaints of abd pain with radiation to the back. Sx started about 1-2 days ago. Sx worse with eating and drinking and became progressively worse. In the Ed, the patient was found to have a normal lipase but a CT abd did demonstrate evidence of acute pancreatitis. Did not tolerate clear PO in the ED. The hospitalist service was consulted for admission.  Review of Systems:  Per above, remiander of 10pt ros reviewed and are neg  Past Medical History  Diagnosis Date  . Graves disease   . Pancreatitis    Past Surgical History  Procedure Laterality Date  . Abdominal hysterectomy    . Breast lumpectomy      benign -- fibroid   Social History:  reports that she has been smoking.  She does not have any smokeless tobacco history on file. She reports that  drinks alcohol. She reports that she does not use illicit drugs.  where does patient live--home, ALF, SNF? and with whom if at home?  Can patient participate in ADLs?  Allergies  Allergen Reactions  . Other Nausea And Vomiting    Any narcotics    History reviewed. No pertinent family history.  (be sure to complete)  Prior to Admission medications   Medication Sig Start Date End Date Taking? Authorizing Provider  cetirizine (ZYRTEC) 10 MG tablet Take 10 mg by mouth daily.   Yes Historical Provider, MD  ibuprofen (ADVIL,MOTRIN) 200 MG tablet Take 200-800 mg by mouth every 6 (six) hours as needed for pain.   Yes Historical Provider, MD  lansoprazole (PREVACID) 30 MG capsule Take 30 mg by mouth daily.   Yes Historical Provider, MD  levothyroxine (SYNTHROID, LEVOTHROID) 175 MCG tablet Take 175 mcg by mouth daily.   Yes Historical Provider, MD    Physical Exam: Filed Vitals:   05/08/13 1448  BP: 140/100  Pulse: 106  Temp: 98.1 F (36.7 C)  Resp: 16  SpO2: 97%     General:  Awake, in nad  Eyes: PERRL B  ENT: membranes moist, dentition fair  Neck: trachea midline, neck supple  Cardiovascular: regular, s1, s2  Respiratory: normal resp effort, no wheezing  Abdomen: soft, nondistended  Skin: normal skin turgor, no abnormal skin lesion seen  Musculoskeletal: perfused, no clubbing  Psychiatric: mood/affect normal // no auditory/visual hallucinations  Neurologic: cn2-12 grossly intact, strength/sensation intact  Labs on Admission:  Basic Metabolic Panel:  Recent Labs Lab 05/08/13 1704  NA 139  K 3.7  CL 100  GLUCOSE 100*  BUN 5*  CREATININE 0.70   Liver Function Tests:  Recent Labs Lab 05/08/13 1600  AST 13  ALT 10  ALKPHOS 84  BILITOT 0.3  PROT 8.0  ALBUMIN 3.9    Recent Labs Lab 05/08/13 1600  LIPASE 24   No results found for this basename: AMMONIA,  in the last 168 hours CBC:  Recent Labs Lab 05/08/13 1600 05/08/13 1704  WBC 14.0*  --   HGB 13.5 14.6  HCT 40.8 43.0  MCV 82.9  --   PLT 157  --    Cardiac Enzymes: No results found for this basename: CKTOTAL, CKMB, CKMBINDEX, TROPONINI,  in the last 168 hours  BNP (last 3 results) No results found  for this basename: PROBNP,  in the last 8760 hours CBG: No results found for this basename: GLUCAP,  in the last 168 hours  Radiological Exams on Admission: Ct Abdomen Pelvis Wo Contrast  05/08/2013   CLINICAL DATA:  Severe abdominal pain.  EXAM: CT ABDOMEN AND PELVIS WITHOUT CONTRAST  TECHNIQUE: Multidetector CT imaging of the abdomen and pelvis was performed following the standard protocol without intravenous contrast.  COMPARISON:  05/02/2008.  FINDINGS: The lung bases are clear. No pleural effusion or pericardial effusion. A benign appearing smoothly marginated and well circumscribed right breast nodule with calcification is  noted. No change since prior CT.  The liver is unremarkable without contrast. There is a stable simple appearing right hepatic lobe cyst near the gallbladder. No biliary dilatation. The gallbladder is normal. No common bile duct dilatation. The pancreas demonstrates inflammatory changes. This is mainly in the pancreatic head and body regions. Findings consistent with acute pancreatitis. The spleen is normal in size. No focal lesions. The adrenal glands are normal. A upper pole right renal calculus is noted. No obstructing ureteral calculi or bladder calculi. A left-sided parapelvic cyst is noted.  The stomach, duodenum, small bowel and colon are unremarkable except for advanced diverticulosis involving the upper sigmoid colon and distal descending colon. No findings for acute diverticulitis. The appendix is normal. No mesenteric or retroperitoneal mass or adenopathy. The aorta is normal.  The uterus is surgically absent. The ovaries are still present and appear normal. There are small cyst associated with the left ovary. The bladder is normal. No pelvic mass, adenopathy or free pelvic fluid collections. No inguinal mass or adenopathy.  The bony structures are unremarkable.  IMPRESSION: CT findings suggest acute pancreatitis. Recommend correlation with amylase and lipase levels.  Upper pole right renal calculus but no obstructing ureteral calculi.  Stable right hepatic lobe cyst.  Small cysts associated with the left ovary.  Diverticulosis of the upper sigmoid colon but no findings for acute diverticulitis.   Electronically Signed   By: Loralie Champagne M.D.   On: 05/08/2013 18:42   US Abdomen Complete  05/08/2013   CLINICAL DATA:  Abdominal pain  EXAM: ULTRASOUND ABDOMEN COMPLETE  COMPARISON:  April 19, 2011  FINDINGS: Gallbladder  No gallstones or wall thickening. There is no pericholecystic fluid. Negative sonographic Murphy's sign.  Common bile duct  Diameter: 4 mm. There is no intrahepatic, common hepatic,  or common bile duct dilatation.  Liver  There is a cyst in the anterior segment of the right lobe of the liver measuring 3.0 x 2.5 x 2.6 cm. This cyst was noted previously and is not appear appreciably changed. No new liver lesions are identified. Liver echogenicity is within normal limits.  IVC  No abnormality visualized.  Pancreas  No pancreatic lesions identified.  Spleen  Size and appearance within normal limits.  Right Kidney  Length: 11.9 cm. Echogenicity within normal limits. No mass, pelvicaliectasis, or perinephric fluid visualized.  Left Kidney  Length: 10.4 cm. Echogenicity within normal limits. There is a 1.3 x 1.3 x 1.7 cm cyst in the lower pole region. No other renal mass. The no pelvicaliectasis or perinephric fluid.  Abdominal aorta  No aneurysm visualized.  IMPRESSION: Stable hepatic and left renal cysts. Study otherwise unremarkable and stable.   Electronically Signed   By: Bretta Bang   On: 05/08/2013 16:46    Assessment/Plan Active Problems:   * No active hospital problems. *   1. Pancreatitis 1. Radiologic evidence of pancreatitis with  normal lipase 2. Will admit to med bed 3. Keep NPO with IVF 4. Analgesics as needed 5. Will follow lipase trends 6. No hx of recent ETOH 7. Will check fasting lipids in the AM 2. DVT prophylaxis  Code Status: Full (must indicate code status--if unknown or must be presumed, indicate so) Family Communication: Pt in room (indicate person spoken with, if applicable, with phone number if by telephone) Disposition Plan: Pending (indicate anticipated LOS)  Time spent:  CHIU, STEPHEN K [facility] Pager [phone]  If 7PM-7AM, please contact night-coverage [email or web address] Password [email or web address] 05/08/2013, 8:03 PM

## 2013-05-09 DIAGNOSIS — D72829 Elevated white blood cell count, unspecified: Secondary | ICD-10-CM

## 2013-05-09 DIAGNOSIS — D696 Thrombocytopenia, unspecified: Secondary | ICD-10-CM

## 2013-05-09 DIAGNOSIS — E871 Hypo-osmolality and hyponatremia: Secondary | ICD-10-CM | POA: Diagnosis present

## 2013-05-09 LAB — BASIC METABOLIC PANEL
BUN: 4 mg/dL — ABNORMAL LOW (ref 6–23)
CO2: 25 mEq/L (ref 19–32)
Chloride: 99 mEq/L (ref 96–112)
GFR calc non Af Amer: >90 mL/min (ref >90)
Glucose, Bld: 101 mg/dL — ABNORMAL HIGH (ref 70–99)
Potassium: 3.5 mEq/L (ref 3.5–5.1)
Sodium: 133 mEq/L — ABNORMAL LOW (ref 135–145)

## 2013-05-09 LAB — CBC
HCT: 38.4 % (ref 36.0–46.0)
Hemoglobin: 12.8 g/dL (ref 12.0–15.0)
MCH: 27.6 pg (ref 26.0–34.0)
MCHC: 33.3 g/dL (ref 30.0–36.0)
RBC: 4.64 MIL/uL (ref 3.87–5.11)
WBC: 11.7 10*3/uL — ABNORMAL HIGH (ref 4.0–10.5)

## 2013-05-09 LAB — LIPASE, BLOOD: Lipase: 24 U/L (ref 11–59)

## 2013-05-09 LAB — LIPID PANEL
Cholesterol: 208 mg/dL — ABNORMAL HIGH (ref 0–200)
HDL: 60 mg/dL (ref >39)
Total CHOL/HDL Ratio: 3.5 RATIO
Triglycerides: 71 mg/dL (ref <150)
VLDL: 14 mg/dL (ref 0–40)

## 2013-05-09 MED ORDER — IBUPROFEN 800 MG PO TABS
800.0000 mg | ORAL_TABLET | Freq: Three times a day (TID) | ORAL | Status: DC | PRN
  Administered 2013-05-09 (×2): 800 mg via ORAL
  Filled 2013-05-09 (×2): qty 1

## 2013-05-09 MED ORDER — IBUPROFEN 800 MG PO TABS
800.0000 mg | ORAL_TABLET | Freq: Once | ORAL | Status: AC
  Administered 2013-05-09: 800 mg via ORAL
  Filled 2013-05-09: qty 1

## 2013-05-09 NOTE — Care Management Note (Signed)
   CARE MANAGEMENT NOTE 05/09/2013  Patient:  Dana, Martin   Account Number:  [id number]  Date Initiated:  05/09/2013  Documentation initiated by:  Gurnoor Sloop  Subjective/Objective Assessment:   47 yo female admitted with pancreatitis.  PCP: Lajuana Carry, MD     Action/Plan:   Home when stable   Anticipated DC Date:     Anticipated DC Plan:  HOME/SELF CARE      DC Planning Services  CM consult      Choice offered to / List presented to:  NA   DME arranged  NA      DME agency  NA     HH arranged  NA      HH agency  NA   Status of service:  In process, will continue to follow Medicare Important Message given?   (If response is "NO", the following Medicare IM given date fields will be blank) Date Medicare IM given:   Date Additional Medicare IM given:    Discharge Disposition:    Per UR Regulation:  Reviewed for med. necessity/level of care/duration of stay  If discussed at Long Length of Stay Meetings, dates discussed:    Comments:  05/09/13 1631 Abigail Marsiglia,RN,MSN [phone] Chart reviewed for utilization of services. No needs identified. PTA pt independent.

## 2013-05-09 NOTE — Progress Notes (Signed)
[facility] PROGRESS NOTE  Dana Martin ZOX:[id number] DOB: September 25, 1965 DOA: 05/08/2013 PCP: Dana Carry, MD  Brief narrative: 47 y.o. female with past medical history history of hypothyroidism, GERD who presented to [facility] ED 05/08/2013 with complaints of mid abdominal pain radiating to the back started about few days prior to this admission. Pain was worse with eating and somewhat relieved with ibuprofen. In ED, vital signs are stable. Lipase level was within normal limits CT abdomen demonstrated possible acute pancreatitis. Abdominal ultrasound did not show acute intra-abdominal findings.  Assessment/Plan:  Principal Problem:   Acute pancreatitis - Patient with CT abdomen findings consistent with acute pancreatitis - Lipase level within normal limits - Pain rated 5/10 this morning so we will advance diet to clear liquid - Continue IV fluids Active Problems:   Leukocytosis, unspecified - Likely reactive, no fevers   Hyponatremia - Secondary to dehydration - Continue IV fluids   Thrombocytopenia, unspecified - Likely secondary to subcutaneous heparin - Discontinue heparin and start SCDs for DVT prophylaxis  Code Status: Full code Family Communication: Family at the bedside Disposition Plan: Home when stable  Dana Passey, MD  [facility] Pager [phone]  If 7PM-7AM, please contact night-coverage [email or web address] Password [email or web address] 05/09/2013, 7:12 AM   LOS: 1 day   Consultants:  None  Procedures:  None  Antibiotics:  None  HPI/Subjective: Dana Martin pain, 5/10 this morning.  Objective: Filed Vitals:   05/08/13 2011 05/08/13 2120 05/08/13 2214 05/09/13 0610  BP: 146/88 140/92 138/75 140/85  Pulse:  84  85  Temp: 98.3 F (36.8 C) 98 F (36.7 C)  98 F (36.7 C)  TempSrc: Oral Oral  Oral  Resp: 18 20  20   Height: 5\' 5"  (1.651 m)     Weight: 96.5 kg (212 lb 11.9 oz)     SpO2: 95% 96%  95%    Intake/Output Summary (Last 24 hours) at 05/09/13 8295 Last  data filed at 05/09/13 0703  Gross per 24 hour  Intake    870 ml  Output    800 ml  Net     70 ml    Exam:   General:  Pt is alert, follows commands appropriately, not in acute distress  Cardiovascular: Regular rate and rhythm, S1/S2, no murmurs, no rubs, no gallops  Respiratory: Clear to auscultation bilaterally, no wheezing, no crackles, no rhonchi  Abdomen: Soft, tender to deep palpation in mid abdomen, non distended, bowel sounds present, no guarding  Extremities: No edema, pulses DP and PT palpable bilaterally  Neuro: Grossly nonfocal  Data Reviewed: Basic Metabolic Panel:  Recent Labs Lab 05/08/13 1704 05/09/13 0405  NA 139 133*  K 3.7 3.5  CL 100 99  CO2  --  25  GLUCOSE 100* 101*  BUN 5* 4*  CREATININE 0.70 0.47*  CALCIUM  --  9.0   Liver Function Tests:  Recent Labs Lab 05/08/13 1600  AST 13  ALT 10  ALKPHOS 84  BILITOT 0.3  PROT 8.0  ALBUMIN 3.9    Recent Labs Lab 05/08/13 1600 05/09/13 0405  LIPASE 24 24   No results found for this basename: AMMONIA,  in the last 168 hours CBC:  Recent Labs Lab 05/08/13 1600 05/08/13 1704 05/09/13 0405  WBC 14.0*  --  11.7*  HGB 13.5 14.6 12.8  HCT 40.8 43.0 38.4  MCV 82.9  --  82.8  PLT 157  --  133*   Cardiac Enzymes: No results found for this basename: CKTOTAL, CKMB, CKMBINDEX, TROPONINI,  in the last 168 hours BNP: No components found with this basename: POCBNP,  CBG: No results found for this basename: GLUCAP,  in the last 168 hours  No results found for this or any previous visit (from the past 240 hour(s)).   Studies: Ct Abdomen Pelvis Wo Contrast 05/08/2013     IMPRESSION: CT findings suggest acute pancreatitis. Recommend correlation with amylase and lipase levels.  Upper pole right renal calculus but no obstructing ureteral calculi.  Stable right hepatic lobe cyst.  Small cysts associated with the left ovary.  Diverticulosis of the upper sigmoid colon but no findings for acute  diverticulitis.    US Abdomen Complete 05/08/2013 IMPRESSION: Stable hepatic and left renal cysts. Study otherwise unremarkable and stable.      Scheduled Meds: . docusate sodium  100 mg Oral BID  . levothyroxine  80 mcg Intravenous Daily  . loratadine  10 mg Oral Daily  . ondansetron (ZOFRAN)   4 mg Intravenous Once  . pantoprazole  40 mg Oral Daily   Continuous Infusions: . sodium chloride 100 mL/hr at 05/08/13 2221

## 2013-05-10 MED ORDER — ONDANSETRON HCL 4 MG PO TABS: 4.0000 mg | ORAL_TABLET | Freq: Four times a day (QID) | ORAL | Status: DC | PRN

## 2013-05-10 MED ORDER — IBUPROFEN 800 MG PO TABS: 800.0000 mg | ORAL_TABLET | Freq: Three times a day (TID) | ORAL | Status: DC | PRN

## 2013-05-10 MED ORDER — LANSOPRAZOLE 30 MG PO CPDR: 30.0000 mg | DELAYED_RELEASE_CAPSULE | Freq: Every day | ORAL | Status: DC

## 2013-05-10 NOTE — Progress Notes (Addendum)
Patient was stable at time of discharge. Reviewed discharge education with patient. She verbalized understanding. She left with discharge packet and prescriptions.

## 2013-05-10 NOTE — Discharge Summary (Signed)
Physician Discharge Summary  Dana Martin:[id number] DOB: January 20, 1966 DOA: 05/08/2013  PCP: Lajuana Carry, MD  Admit date: 05/08/2013 Discharge date: 05/10/2013  Recommendations for Outpatient Follow-up:  1. You were hospitalized for acute pancreatitis. Your lipase level is within normal limits.  2. Please advance diet slowly; now eating regular diet and recommendation is for small regular meals  3. Please use Advil with caution as it can cause stomach mucosa erosion and bleeding.   Discharge Diagnoses:  Principal Problem:   Pancreatitis Active Problems:   Leukocytosis, unspecified   Hyponatremia   Thrombocytopenia, unspecified    Discharge Condition: Medically stable for discharge home today  Diet recommendation: Diet regular, small regular meals  History of present illness:  47 y.o. female with past medical history history of hypothyroidism, GERD who presented to [facility] ED 05/08/2013 with complaints of mid abdominal pain radiating to the back started about few days prior to this admission. Pain was worse with eating and somewhat relieved with ibuprofen.  In ED, vital signs are stable. Lipase level was within normal limits CT abdomen demonstrated possible acute pancreatitis. Abdominal ultrasound did not show acute intra-abdominal findings.   Assessment/Plan:   Principal Problem:  Acute pancreatitis  - Patient with CT abdomen findings consistent with acute pancreatitis  - Lipase level within normal limits  - Pain rated 2-3/10 this morning so we will advance diet to regular  Active Problems:  Leukocytosis, unspecified  - Likely reactive, no fevers  Hyponatremia  - Secondary to dehydration  - sodium ranges 133- 139 Thrombocytopenia, unspecified  - Likely secondary to subcutaneous heparin  - Discontinued heparin and started SCDs for DVT prophylaxis in hospital  Code Status: Full code  Family Communication: Family at the bedside   Manson Passey, MD  [facility]   Pager [phone]   Consultants:  None Procedures:  None Antibiotics:  None   Signed:  Manson Passey, MD  [facility] 05/10/2013, 10:46 AM  Pager #: [phone]    Discharge Exam: Filed Vitals:   05/10/13 0600  BP: 129/83  Pulse: 88  Temp: 98.2 F (36.8 C)  Resp: 20   Filed Vitals:   05/09/13 1400 05/09/13 2155 05/09/13 2248 05/10/13 0600  BP: 133/75 142/93 135/70 129/83  Pulse: 80 89  88  Temp: 98.3 F (36.8 C) 98.5 F (36.9 C)  98.2 F (36.8 C)  TempSrc: Oral Oral  Oral  Resp: 20 20  20   Height:      Weight:      SpO2: 97% 98%  95%    General: Pt is alert, follows commands appropriately, not in acute distress Cardiovascular: Regular rate and rhythm, S1/S2 +, no murmurs, no rubs, no gallops Respiratory: Clear to auscultation bilaterally, no wheezing, no crackles, no rhonchi Abdominal: Soft, non tender, non distended, bowel sounds +, no guarding Extremities: no edema, no cyanosis, pulses palpable bilaterally DP and PT Neuro: Grossly nonfocal  Discharge Instructions  Discharge Orders   Future Orders Complete By Expires   Call MD for:  difficulty breathing, headache or visual disturbances  As directed    Call MD for:  persistant dizziness or light-headedness  As directed    Call MD for:  persistant nausea and vomiting  As directed    Call MD for:  severe uncontrolled pain  As directed    Diet - low sodium heart healthy  As directed    Discharge instructions  As directed    Comments:     1. You were hospitalized for acute  pancreatitis. Your lipase level is within normal limits. 2. Please advance diet slowly; now eating regular diet and recommendation is for small regular meals 3. Please use Advil with caution as it can cause stomach mucosa erosion and bleeding.   Increase activity slowly  As directed        Medication List         cetirizine 10 MG tablet  Commonly known as:  ZYRTEC  Take 10 mg by mouth daily.     ibuprofen 800 MG tablet   Commonly known as:  ADVIL,MOTRIN  Take 1 tablet (800 mg total) by mouth every 8 (eight) hours as needed for pain.     lansoprazole 30 MG capsule  Commonly known as:  PREVACID  Take 1 capsule (30 mg total) by mouth daily.     levothyroxine 175 MCG tablet  Commonly known as:  SYNTHROID, LEVOTHROID  Take 175 mcg by mouth daily.     ondansetron 4 MG tablet  Commonly known as:  ZOFRAN  Take 1 tablet (4 mg total) by mouth every 6 (six) hours as needed for nausea.           Follow-up Information   Follow up with [facility]    . Call in 2 weeks. [phone] sch appt with Dr. Elisabeth Pigeon)    Contact information:   [address] [place] [address]        The results of significant diagnostics from this hospitalization (including imaging, microbiology, ancillary and laboratory) are listed below for reference.    Significant Diagnostic Studies: Ct Abdomen Pelvis Wo Contrast  05/08/2013   CLINICAL DATA:  Severe abdominal pain.  EXAM: CT ABDOMEN AND PELVIS WITHOUT CONTRAST  TECHNIQUE: Multidetector CT imaging of the abdomen and pelvis was performed following the standard protocol without intravenous contrast.  COMPARISON:  05/02/2008.  FINDINGS: The lung bases are clear. No pleural effusion or pericardial effusion. A benign appearing smoothly marginated and well circumscribed right breast nodule with calcification is noted. No change since prior CT.  The liver is unremarkable without contrast. There is a stable simple appearing right hepatic lobe cyst near the gallbladder. No biliary dilatation. The gallbladder is normal. No common bile duct dilatation. The pancreas demonstrates inflammatory changes. This is mainly in the pancreatic head and body regions. Findings consistent with acute pancreatitis. The spleen is normal in size. No focal lesions. The adrenal glands are normal. A upper pole right renal calculus is noted. No obstructing ureteral calculi or  bladder calculi. A left-sided parapelvic cyst is noted.  The stomach, duodenum, small bowel and colon are unremarkable except for advanced diverticulosis involving the upper sigmoid colon and distal descending colon. No findings for acute diverticulitis. The appendix is normal. No mesenteric or retroperitoneal mass or adenopathy. The aorta is normal.  The uterus is surgically absent. The ovaries are still present and appear normal. There are small cyst associated with the left ovary. The bladder is normal. No pelvic mass, adenopathy or free pelvic fluid collections. No inguinal mass or adenopathy.  The bony structures are unremarkable.  IMPRESSION: CT findings suggest acute pancreatitis. Recommend correlation with amylase and lipase levels.  Upper pole right renal calculus but no obstructing ureteral calculi.  Stable right hepatic lobe cyst.  Small cysts associated with the left ovary.  Diverticulosis of the upper sigmoid colon but no findings for acute diverticulitis.   Electronically Signed   By: Loralie Champagne M.D.   On: 05/08/2013 18:42  US Abdomen Complete  05/08/2013   CLINICAL DATA:  Abdominal pain  EXAM: ULTRASOUND ABDOMEN COMPLETE  COMPARISON:  April 19, 2011  FINDINGS: Gallbladder  No gallstones or wall thickening. There is no pericholecystic fluid. Negative sonographic Murphy's sign.  Common bile duct  Diameter: 4 mm. There is no intrahepatic, common hepatic, or common bile duct dilatation.  Liver  There is a cyst in the anterior segment of the right lobe of the liver measuring 3.0 x 2.5 x 2.6 cm. This cyst was noted previously and is not appear appreciably changed. No new liver lesions are identified. Liver echogenicity is within normal limits.  IVC  No abnormality visualized.  Pancreas  No pancreatic lesions identified.  Spleen  Size and appearance within normal limits.  Right Kidney  Length: 11.9 cm. Echogenicity within normal limits. No mass, pelvicaliectasis, or perinephric fluid visualized.   Left Kidney  Length: 10.4 cm. Echogenicity within normal limits. There is a 1.3 x 1.3 x 1.7 cm cyst in the lower pole region. No other renal mass. The no pelvicaliectasis or perinephric fluid.  Abdominal aorta  No aneurysm visualized.  IMPRESSION: Stable hepatic and left renal cysts. Study otherwise unremarkable and stable.   Electronically Signed   By: Bretta Bang   On: 05/08/2013 16:46    Microbiology: No results found for this or any previous visit (from the past 240 hour(s)).   Labs: Basic Metabolic Panel:  Recent Labs Lab 05/08/13 1704 05/09/13 0405  NA 139 133*  K 3.7 3.5  CL 100 99  CO2  --  25  GLUCOSE 100* 101*  BUN 5* 4*  CREATININE 0.70 0.47*  CALCIUM  --  9.0   Liver Function Tests:  Recent Labs Lab 05/08/13 1600  AST 13  ALT 10  ALKPHOS 84  BILITOT 0.3  PROT 8.0  ALBUMIN 3.9    Recent Labs Lab 05/08/13 1600 05/09/13 0405  LIPASE 24 24   No results found for this basename: AMMONIA,  in the last 168 hours CBC:  Recent Labs Lab 05/08/13 1600 05/08/13 1704 05/09/13 0405  WBC 14.0*  --  11.7*  HGB 13.5 14.6 12.8  HCT 40.8 43.0 38.4  MCV 82.9  --  82.8  PLT 157  --  133*   Cardiac Enzymes: No results found for this basename: CKTOTAL, CKMB, CKMBINDEX, TROPONINI,  in the last 168 hours BNP: BNP (last 3 results) No results found for this basename: PROBNP,  in the last 8760 hours CBG: No results found for this basename: GLUCAP,  in the last 168 hours  Time coordinating discharge: Over 30 minutes

## 2013-05-29 ENCOUNTER — Ambulatory Visit: Payer: 59 | Attending: Internal Medicine | Admitting: Internal Medicine

## 2013-05-29 VITALS — BP 129/89 | HR 86 | Temp 98.4°F | Resp 16 | Ht 66.0 in | Wt 210.0 lb

## 2013-05-29 DIAGNOSIS — K859 Acute pancreatitis without necrosis or infection, unspecified: Secondary | ICD-10-CM

## 2013-05-29 DIAGNOSIS — Z0000 Encounter for general adult medical examination without abnormal findings: Secondary | ICD-10-CM

## 2013-05-29 DIAGNOSIS — E871 Hypo-osmolality and hyponatremia: Secondary | ICD-10-CM

## 2013-05-29 LAB — TSH: TSH: 2.733 u[IU]/mL (ref 0.350–4.500)

## 2013-05-29 MED ORDER — LEVOTHYROXINE SODIUM 175 MCG PO TABS: 175.0000 ug | ORAL_TABLET | Freq: Every day | ORAL | Status: DC

## 2013-05-29 MED ORDER — ONDANSETRON HCL 4 MG PO TABS: 4.0000 mg | ORAL_TABLET | Freq: Four times a day (QID) | ORAL | Status: DC | PRN

## 2013-05-29 MED ORDER — CETIRIZINE HCL 10 MG PO TABS: 10.0000 mg | ORAL_TABLET | Freq: Every day | ORAL | Status: DC

## 2013-05-29 MED ORDER — IBUPROFEN 800 MG PO TABS: 800.0000 mg | ORAL_TABLET | Freq: Three times a day (TID) | ORAL | Status: DC | PRN

## 2013-05-29 MED ORDER — LANSOPRAZOLE 30 MG PO CPDR: 30.0000 mg | DELAYED_RELEASE_CAPSULE | Freq: Every day | ORAL | Status: DC

## 2013-05-29 NOTE — Progress Notes (Signed)
CC: follow up  HPI: 47 year old female with history of pancreatitis, hypothyroidism who presented to clinic for follow up. Patient has no current complaints. No abdominal pain, nausea or vomiting. No diarrhea or constipation. No blood in the stool.  Allergies  Allergen Reactions  . Other Nausea And Vomiting    Any narcotics   Past Medical History  Diagnosis Date  . Graves disease   . Pancreatitis    No current outpatient prescriptions on file prior to visit.   No current facility-administered medications on file prior to visit.   Family medical history significant for HTN, HLD  History   Social History  . Marital Status: Married    Spouse Name: N/A    Number of Children: N/A  . Years of Education: N/A   Occupational History  . Not on file.   Social History Main Topics  . Smoking status: Current Every Day Smoker  . Smokeless tobacco: Not on file  . Alcohol Use: Yes     Comment: rarely  . Drug Use: No  . Sexual Activity: Not on file   Other Topics Concern  . Not on file   Social History Narrative  . No narrative on file    Review of Systems  Constitutional: Negative for fever, chills, diaphoresis, activity change, appetite change and fatigue.  HENT: Negative for ear pain, nosebleeds, congestion, facial swelling, rhinorrhea, neck pain, neck stiffness and ear discharge.   Eyes: Negative for pain, discharge, redness, itching and visual disturbance.  Respiratory: Negative for cough, choking, chest tightness, shortness of breath, wheezing and stridor.   Cardiovascular: Negative for chest pain, palpitations and leg swelling.  Gastrointestinal: Negative for abdominal distention.  Genitourinary: Negative for dysuria, urgency, frequency, hematuria, flank pain, decreased urine volume, difficulty urinating and dyspareunia.  Musculoskeletal: Negative for back pain, joint swelling, arthralgias and gait problem.  Neurological: Negative for dizziness, tremors, seizures,  syncope, facial asymmetry, speech difficulty, weakness, light-headedness, numbness and headaches.  Hematological: Negative for adenopathy. Does not bruise/bleed easily.  Psychiatric/Behavioral: Negative for hallucinations, behavioral problems, confusion, dysphoric mood, decreased concentration and agitation.    Objective:  There were no vitals filed for this visit.  Physical Exam  Constitutional: Appears well-developed and well-nourished. No distress.  HENT: Normocephalic. External right and left ear normal. Oropharynx is clear and moist.  Eyes: Conjunctivae and EOM are normal. PERRLA, no scleral icterus.  Neck: Normal ROM. Neck supple. No JVD. No tracheal deviation. No thyromegaly.  CVS: RRR, S1/S2 +, no murmurs, no gallops, no carotid bruit.  Pulmonary: Effort and breath sounds normal, no stridor, rhonchi, wheezes, rales.  Abdominal: Soft. BS +,  no distension, tenderness, rebound or guarding.  Musculoskeletal: Normal range of motion. No edema and no tenderness.  Lymphadenopathy: No lymphadenopathy noted, cervical, inguinal. Neuro: Alert. Normal reflexes, muscle tone coordination. No cranial nerve deficit. Skin: Skin is warm and dry. No rash noted. Not diaphoretic. No erythema. No pallor.  Psychiatric: Normal mood and affect. Behavior, judgment, thought content normal.   Lab Results  Component Value Date   WBC 11.7* 05/09/2013   HGB 12.8 05/09/2013   HCT 38.4 05/09/2013   MCV 82.8 05/09/2013   PLT 133* 05/09/2013   Lab Results  Component Value Date   CREATININE 0.47* 05/09/2013   BUN 4* 05/09/2013   NA 133* 05/09/2013   K 3.5 05/09/2013   CL 99 05/09/2013   CO2 25 05/09/2013    No results found for this basename: HGBA1C   Lipid Panel  Component Value Date/Time   CHOL 208* 05/09/2013 0405   TRIG 71 05/09/2013 0405   HDL 60 05/09/2013 0405   CHOLHDL 3.5 05/09/2013 0405   VLDL 14 05/09/2013 0405   LDLCALC 134* 05/09/2013 0405       Assessment and plan:   Patient Active  Problem List   Diagnosis Date Noted  . Pancreatitis 05/08/2013    Priority: High - stable, lipase WNL  . Preventative health care 05/29/2013    Priority: Medium - declined flu vaccine - has had hysterectomy so she does not have PAP - obtain A1c, TSH - recent lipid level acceptable numbers  . Hyponatremia - stable 05/09/2013    Priority: Medium

## 2013-05-29 NOTE — Progress Notes (Signed)
Pt is here today following up after her hospital visit with pancreatitis. Pt reports that she is doing well and has no C.C.

## 2013-05-29 NOTE — Patient Instructions (Signed)

## 2013-08-29 ENCOUNTER — Ambulatory Visit: Payer: 59 | Admitting: Internal Medicine

## 2013-09-13 ENCOUNTER — Ambulatory Visit: Payer: 59

## 2013-12-05 ENCOUNTER — Encounter: Payer: Self-pay | Admitting: Family Medicine

## 2013-12-05 ENCOUNTER — Ambulatory Visit: Payer: 59 | Attending: Family Medicine | Admitting: Family Medicine

## 2013-12-05 VITALS — BP 113/84 | HR 100 | Temp 98.7°F | Resp 16 | Wt 206.4 lb

## 2013-12-05 DIAGNOSIS — K859 Acute pancreatitis without necrosis or infection, unspecified: Secondary | ICD-10-CM

## 2013-12-05 DIAGNOSIS — R109 Unspecified abdominal pain: Secondary | ICD-10-CM | POA: Insufficient documentation

## 2013-12-05 DIAGNOSIS — R1013 Epigastric pain: Secondary | ICD-10-CM

## 2013-12-05 LAB — CBC WITH DIFFERENTIAL/PLATELET
Basophils Absolute: 0 10*3/uL (ref 0.0–0.1)
Basophils Relative: 0 % (ref 0–1)
EOS ABS: 0.1 10*3/uL (ref 0.0–0.7)
Eosinophils Relative: 1 % (ref 0–5)
HCT: 38.7 % (ref 36.0–46.0)
HEMOGLOBIN: 13 g/dL (ref 12.0–15.0)
LYMPHS ABS: 1.9 10*3/uL (ref 0.7–4.0)
LYMPHS PCT: 14 % (ref 12–46)
MCH: 26.5 pg (ref 26.0–34.0)
MCHC: 33.6 g/dL (ref 30.0–36.0)
MCV: 79 fL (ref 78.0–100.0)
Monocytes Absolute: 1 10*3/uL (ref 0.1–1.0)
Monocytes Relative: 7 % (ref 3–12)
NEUTROS ABS: 10.7 10*3/uL — AB (ref 1.7–7.7)
NEUTROS PCT: 78 % — AB (ref 43–77)
Platelets: 203 10*3/uL (ref 150–400)
RBC: 4.9 MIL/uL (ref 3.87–5.11)
RDW: 15.4 % (ref 11.5–15.5)
WBC: 13.7 10*3/uL — AB (ref 4.0–10.5)

## 2013-12-05 LAB — LIPASE: Lipase: 88 U/L — ABNORMAL HIGH (ref 0–75)

## 2013-12-05 MED ORDER — OXYCODONE-ACETAMINOPHEN 5-325 MG PO TABS: 1.0000 | ORAL_TABLET | Freq: Three times a day (TID) | ORAL | Status: DC | PRN

## 2013-12-05 MED ORDER — CETIRIZINE HCL 10 MG PO TABS
10.0000 mg | ORAL_TABLET | Freq: Every day | ORAL | Status: DC
Start: 2013-12-05 — End: 2014-05-08

## 2013-12-05 NOTE — Progress Notes (Signed)
   Subjective:    Patient ID: Dana Martin, female    DOB: Oct 03, 1965, 48 y.o.   MRN: [id number]  HPI  Abdomen Pain - Pancreattis Patient has a history of pancreatitis this past October Was also hospitalized for the same thing a year ago in August She started having similar symptoms four days ago She is having abd pain that started Monday She has not eaten since her symptoms started She tried to eat some crackers yesterday and she started having pain instantly She does get some relief with ibuprofen When she eats anything by mouth it makes the pain worse She is not quite sure what is causing her discomfort Some nausea  No vomiting.  Last urinated this AM Pain scale 5   Review of Systems     Objective:   Physical Exam Alert mild distress conversational No CVA tenderness  Epigastric and right upper quadrant moderate pain.  No guarding rebound Bowel sounds are normal MM moist Extremities:  No cyanosis, edema, or deformity noted with good range of motion of all major joints.          Assessment & Plan:

## 2013-12-05 NOTE — Patient Instructions (Signed)
No solid foods for the next 24 hours Drink plenty of fluids to keep your body hydrated If the pain starts to subside you can introduce solid foods Any vomiting or the pain gets worse, go right to the ED        Acute Pancreatitis Acute pancreatitis is a disease in which the pancreas becomes suddenly inflamed. The pancreas is a large gland located behind your stomach. The pancreas produces enzymes that help digest food. The pancreas also releases the hormones glucagon and insulin that help regulate blood sugar. Damage to the pancreas occurs when the digestive enzymes from the pancreas are activated and begin attacking the pancreas before being released into the intestine. Most acute attacks last a couple of days and can cause serious complications. Some people become dehydrated and develop low blood pressure. In severe cases, bleeding into the pancreas can lead to shock and can be life-threatening. The lungs, heart, and kidneys may fail. CAUSES  Pancreatitis can happen to anyone. In some cases, the cause is unknown. Most cases are caused by:  Alcohol abuse.  Gallstones. Other less common causes are:  Certain medicines.  Exposure to certain chemicals.  Infection.  Damage caused by an accident (trauma).  Abdominal surgery. SYMPTOMS   Pain in the upper abdomen that may radiate to the back.  Tenderness and swelling of the abdomen.  Nausea and vomiting. DIAGNOSIS  Your caregiver will perform a physical exam. Blood and stool tests may be done to confirm the diagnosis. Imaging tests may also be done, such as X-rays, CT scans, or an ultrasound of the abdomen. TREATMENT  Treatment usually requires a stay in the hospital. Treatment may include:  Pain medicine.  Fluid replacement through an intravenous line (IV).  Placing a tube in the stomach to remove stomach contents and control vomiting.  Not eating for 3 or 4 days. This gives your pancreas a rest, because enzymes are not being  produced that can cause further damage.  Antibiotic medicines if your condition is caused by an infection.  Surgery of the pancreas or gallbladder. HOME CARE INSTRUCTIONS   Follow the diet advised by your caregiver. This may involve avoiding alcohol and decreasing the amount of fat in your diet.  Eat smaller, more frequent meals. This reduces the amount of digestive juices the pancreas produces.  Drink enough fluids to keep your urine clear or pale yellow.  Only take over-the-counter or prescription medicines as directed by your caregiver.  Avoid drinking alcohol if it caused your condition.  Do not smoke.  Get plenty of rest.  Check your blood sugar at home as directed by your caregiver.  Keep all follow-up appointments as directed by your caregiver. SEEK MEDICAL CARE IF:   You do not recover as quickly as expected.  You develop new or worsening symptoms.  You have persistent pain, weakness, or nausea.  You recover and then have another episode of pain. SEEK IMMEDIATE MEDICAL CARE IF:   You are unable to eat or keep fluids down.  Your pain becomes severe.  You have a fever or persistent symptoms for more than 2 to 3 days.  You have a fever and your symptoms suddenly get worse.  Your skin or the white part of your eyes turn yellow (jaundice).  You develop vomiting.  You feel dizzy, or you faint.  Your blood sugar is high (over 300 mg/dL). MAKE SURE YOU:   Understand these instructions.  Will watch your condition.  Will get help right away if  you are not doing well or get worse. Document Released: 07/25/2005 Document Revised: 01/24/2012 Document Reviewed: 11/03/2011 [facility] Patient Information 2014 [place], [place].

## 2013-12-05 NOTE — Assessment & Plan Note (Signed)
Very likely recurrent.  Will check labs.  Since not vomtiing and not dehydrated will try outpatient trial of clear liquid and analgesics.  Cautioned to go to ER if any worsening.  She agrees

## 2013-12-06 ENCOUNTER — Inpatient Hospital Stay (HOSPITAL_COMMUNITY)
Admission: EM | Admit: 2013-12-06 | Discharge: 2013-12-10 | DRG: 440 | Disposition: A | Discharge status: Home / Self Care | Payer: 59 | Attending: Family Medicine | Admitting: Family Medicine

## 2013-12-06 ENCOUNTER — Encounter (HOSPITAL_COMMUNITY): Payer: Self-pay | Admitting: Emergency Medicine

## 2013-12-06 ENCOUNTER — Emergency Department (HOSPITAL_COMMUNITY): Payer: 59

## 2013-12-06 ENCOUNTER — Telehealth: Payer: Self-pay

## 2013-12-06 DIAGNOSIS — K859 Acute pancreatitis without necrosis or infection, unspecified: Secondary | ICD-10-CM

## 2013-12-06 DIAGNOSIS — E871 Hypo-osmolality and hyponatremia: Secondary | ICD-10-CM

## 2013-12-06 DIAGNOSIS — E039 Hypothyroidism, unspecified: Secondary | ICD-10-CM | POA: Diagnosis present

## 2013-12-06 DIAGNOSIS — E860 Dehydration: Secondary | ICD-10-CM | POA: Diagnosis present

## 2013-12-06 DIAGNOSIS — Z87891 Personal history of nicotine dependence: Secondary | ICD-10-CM

## 2013-12-06 DIAGNOSIS — Z79899 Other long term (current) drug therapy: Secondary | ICD-10-CM

## 2013-12-06 DIAGNOSIS — Z0000 Encounter for general adult medical examination without abnormal findings: Secondary | ICD-10-CM

## 2013-12-06 DIAGNOSIS — J309 Allergic rhinitis, unspecified: Secondary | ICD-10-CM | POA: Diagnosis present

## 2013-12-06 HISTORY — DX: Acute pancreatitis without necrosis or infection, unspecified: K85.90

## 2013-12-06 LAB — CBC WITH DIFFERENTIAL/PLATELET
BASOS ABS: 0 10*3/uL (ref 0.0–0.1)
Basophils Relative: 0 % (ref 0–1)
EOS ABS: 0 10*3/uL (ref 0.0–0.7)
Eosinophils Relative: 0 % (ref 0–5)
HCT: 39.8 % (ref 36.0–46.0)
HEMOGLOBIN: 13.4 g/dL (ref 12.0–15.0)
Lymphocytes Relative: 10 % — ABNORMAL LOW (ref 12–46)
Lymphs Abs: 1.4 10*3/uL (ref 0.7–4.0)
MCH: 27.5 pg (ref 26.0–34.0)
MCHC: 33.7 g/dL (ref 30.0–36.0)
MCV: 81.7 fL (ref 78.0–100.0)
MONOS PCT: 6 % (ref 3–12)
Monocytes Absolute: 0.8 10*3/uL (ref 0.1–1.0)
NEUTROS PCT: 84 % — AB (ref 43–77)
Neutro Abs: 11.9 10*3/uL — ABNORMAL HIGH (ref 1.7–7.7)
Platelets: 196 10*3/uL (ref 150–400)
RBC: 4.87 MIL/uL (ref 3.87–5.11)
RDW: 14.8 % (ref 11.5–15.5)
WBC: 14.1 10*3/uL — AB (ref 4.0–10.5)

## 2013-12-06 LAB — COMPREHENSIVE METABOLIC PANEL
ALT: 9 U/L (ref 0–35)
AST: 12 U/L (ref 0–37)
Albumin: 3.4 g/dL — ABNORMAL LOW (ref 3.5–5.2)
Alkaline Phosphatase: 82 U/L (ref 39–117)
BUN: 12 mg/dL (ref 6–23)
CALCIUM: 9.4 mg/dL (ref 8.4–10.5)
CO2: 22 meq/L (ref 19–32)
Chloride: 96 mEq/L (ref 96–112)
Creatinine, Ser: 0.56 mg/dL (ref 0.50–1.10)
GFR calc Af Amer: >90 mL/min (ref >90)
GFR calc non Af Amer: >90 mL/min (ref >90)
Glucose, Bld: 83 mg/dL (ref 70–99)
Potassium: 4.1 mEq/L (ref 3.7–5.3)
SODIUM: 134 meq/L — AB (ref 137–147)
TOTAL PROTEIN: 8.3 g/dL (ref 6.0–8.3)
Total Bilirubin: 0.4 mg/dL (ref 0.3–1.2)

## 2013-12-06 LAB — URINALYSIS, ROUTINE W REFLEX MICROSCOPIC
GLUCOSE, UA: NEGATIVE mg/dL
Ketones, ur: 40 mg/dL — AB
Nitrite: NEGATIVE
Protein, ur: 30 mg/dL — AB
SPECIFIC GRAVITY, URINE: 1.028 (ref 1.005–1.030)
Urobilinogen, UA: 1 mg/dL (ref 0.0–1.0)
pH: 6 (ref 5.0–8.0)

## 2013-12-06 LAB — AMYLASE: Amylase: 43 U/L (ref 0–105)

## 2013-12-06 LAB — MAGNESIUM: Magnesium: 1.8 mg/dL (ref 1.5–2.5)

## 2013-12-06 LAB — CBC
HCT: 36.8 % (ref 36.0–46.0)
Hemoglobin: 12.2 g/dL (ref 12.0–15.0)
MCH: 26.9 pg (ref 26.0–34.0)
MCHC: 33.2 g/dL (ref 30.0–36.0)
MCV: 81.1 fL (ref 78.0–100.0)
PLATELETS: 196 10*3/uL (ref 150–400)
RBC: 4.54 MIL/uL (ref 3.87–5.11)
RDW: 14.6 % (ref 11.5–15.5)
WBC: 14.8 10*3/uL — ABNORMAL HIGH (ref 4.0–10.5)

## 2013-12-06 LAB — URINE MICROSCOPIC-ADD ON

## 2013-12-06 LAB — PREGNANCY, URINE: Preg Test, Ur: NEGATIVE

## 2013-12-06 LAB — LIPASE, BLOOD: Lipase: 68 U/L — ABNORMAL HIGH (ref 11–59)

## 2013-12-06 LAB — CREATININE, SERUM
Creatinine, Ser: 0.46 mg/dL — ABNORMAL LOW (ref 0.50–1.10)
GFR calc Af Amer: >90 mL/min (ref >90)
GFR calc non Af Amer: >90 mL/min (ref >90)

## 2013-12-06 LAB — PHOSPHORUS: PHOSPHORUS: 2.8 mg/dL (ref 2.3–4.6)

## 2013-12-06 MED ORDER — HEPARIN SODIUM (PORCINE) 5000 UNIT/ML IJ SOLN
5000.0000 [IU] | Freq: Three times a day (TID) | INTRAMUSCULAR | Status: DC
  Filled 2013-12-06 (×14): qty 1

## 2013-12-06 MED ORDER — ACETAMINOPHEN 325 MG PO TABS
650.0000 mg | ORAL_TABLET | Freq: Four times a day (QID) | ORAL | Status: DC | PRN
  Administered 2013-12-07 – 2013-12-08 (×2): 650 mg via ORAL
  Filled 2013-12-06 (×2): qty 2

## 2013-12-06 MED ORDER — SODIUM CHLORIDE 0.9 % IV BOLUS (SEPSIS)
1000.0000 mL | Freq: Once | INTRAVENOUS | Status: AC
  Administered 2013-12-06: 1000 mL via INTRAVENOUS

## 2013-12-06 MED ORDER — ACETAMINOPHEN 650 MG RE SUPP
650.0000 mg | Freq: Four times a day (QID) | RECTAL | Status: DC | PRN
Start: 2013-12-06 — End: 2013-12-08

## 2013-12-06 MED ORDER — ONDANSETRON HCL 4 MG PO TABS: 4.0000 mg | ORAL_TABLET | Freq: Four times a day (QID) | ORAL | Status: DC | PRN

## 2013-12-06 MED ORDER — KETOROLAC TROMETHAMINE 15 MG/ML IJ SOLN: 15.0000 mg | Freq: Four times a day (QID) | INTRAMUSCULAR | Status: DC | PRN

## 2013-12-06 MED ORDER — ONDANSETRON HCL 4 MG/2ML IJ SOLN
4.0000 mg | Freq: Four times a day (QID) | INTRAMUSCULAR | Status: DC | PRN
  Administered 2013-12-08: 4 mg via INTRAVENOUS
  Filled 2013-12-06: qty 2

## 2013-12-06 MED ORDER — IOHEXOL 300 MG/ML  SOLN: 50.0000 mL | Freq: Once | INTRAMUSCULAR | Status: DC | PRN

## 2013-12-06 MED ORDER — SODIUM CHLORIDE 0.9 % IV BOLUS (SEPSIS)
1000.0000 mL | Freq: Once | INTRAVENOUS | Status: AC
Start: 2013-12-06 — End: 2013-12-06
  Administered 2013-12-06: 1000 mL via INTRAVENOUS

## 2013-12-06 MED ORDER — PANTOPRAZOLE SODIUM 40 MG PO TBEC
40.0000 mg | DELAYED_RELEASE_TABLET | Freq: Every day | ORAL | Status: DC
  Administered 2013-12-07 – 2013-12-10 (×4): 40 mg via ORAL
  Filled 2013-12-06 (×4): qty 1

## 2013-12-06 MED ORDER — IBUPROFEN 800 MG PO TABS
800.0000 mg | ORAL_TABLET | Freq: Once | ORAL | Status: AC
  Administered 2013-12-06: 800 mg via ORAL
  Filled 2013-12-06: qty 1

## 2013-12-06 MED ORDER — LEVOTHYROXINE SODIUM 175 MCG PO TABS
175.0000 ug | ORAL_TABLET | Freq: Every day | ORAL | Status: DC
  Administered 2013-12-07 – 2013-12-10 (×4): 175 ug via ORAL
  Filled 2013-12-06 (×5): qty 1

## 2013-12-06 MED ORDER — DEXTROSE-NACL 5-0.9 % IV SOLN
INTRAVENOUS | Status: DC
  Administered 2013-12-06 – 2013-12-09 (×4): via INTRAVENOUS

## 2013-12-06 MED ORDER — IBUPROFEN 800 MG PO TABS
800.0000 mg | ORAL_TABLET | Freq: Three times a day (TID) | ORAL | Status: DC | PRN
  Administered 2013-12-06 – 2013-12-10 (×11): 800 mg via ORAL
  Filled 2013-12-06 (×12): qty 1

## 2013-12-06 NOTE — ED Notes (Addendum)
Pt reports hx of pancreatitis and reports this pain is similar. Pt reports decrease in appetite since Monday.  Pt reports pain 6/10 but denies pain medication at present time.

## 2013-12-06 NOTE — ED Provider Notes (Signed)
CSN: [id number]     Arrival date & time 12/06/13  0940 History   First MD Initiated Contact with Patient 12/06/13 [phone]     Chief Complaint  Patient presents with  . Abdominal Pain     (Consider location/radiation/quality/duration/timing/severity/associated sxs/prior Treatment) The history is provided by the patient. No language interpreter was used.  Dana Martin is a 48 y/o female with past medical history of Graves' disease, pancreatitis presenting to the ED with believes that she is having a pancreatitis flareup. Stated that she's had a flareup of 4-last episode was in October 2014-reported that the pain is very similar to her flareup. Stated that on Sunday she started not to feel well, reported Monday was when the pain progressively got worse. Stated that the discomfort is localized to the upper abdomen, epigastric region described as a constant burning, muscle tightening sensation that radiates towards her back. Reported that she was seen and assessed by her PCP yesterday, [facility], who recommended patient to come to the ED yesterday but patient was unable to. Reported that she's been having ongoing nausea with one episode of emesis yesterday after taking Percocet. Stated that she's been using Advil with minimal leaf. Reported that she has not had a bowel movement since Monday-stated that she's been having decreased appetite. Denied melena, hematochezia, fever, chills, chest pain, shortness of breath, difficulty breathing, urinary issues. PCP Dr. Hyman Hopes  Past Medical History  Diagnosis Date  . Graves disease   . Pancreatitis    Past Surgical History  Procedure Laterality Date  . Abdominal hysterectomy    . Breast lumpectomy      benign -- fibroid   No family history on file. History  Substance Use Topics  . Smoking status: Former Smoker    Quit date: 05/08/2013  . Smokeless tobacco: Not on file  . Alcohol Use: Yes     Comment:  rarely   OB History   Grav Para Term Preterm Abortions TAB SAB Ect Mult Living                 Review of Systems  Constitutional: Negative for fever and chills.  Respiratory: Negative for chest tightness and shortness of breath.   Cardiovascular: Negative for chest pain.  Gastrointestinal: Positive for nausea, vomiting, abdominal pain and constipation. Negative for diarrhea, blood in stool and anal bleeding.  Genitourinary: Negative for dysuria and decreased urine volume.  Musculoskeletal: Positive for back pain. Negative for neck pain.  Neurological: Negative for dizziness, weakness and numbness.  All other systems reviewed and are negative.     Allergies  Omnipaque and Other  Home Medications   Prior to Admission medications   Medication Sig Start Date End Date Taking? Authorizing Provider  cetirizine (ZYRTEC) 10 MG tablet Take 1 tablet (10 mg total) by mouth daily. 12/05/13  Yes Carney Living, MD  diphenoxylate-atropine (LOMOTIL) 2.5-0.025 MG per tablet Take 1 tablet by mouth 4 (four) times daily as needed for diarrhea or loose stools.   Yes Historical Provider, MD  ibuprofen (ADVIL,MOTRIN) 200 MG tablet Take 400 mg by mouth every 6 (six) hours as needed.   Yes Historical Provider, MD  lansoprazole (PREVACID) 30 MG capsule Take 1 capsule (30 mg total) by mouth daily. 05/29/13  Yes Alison Murray, MD  levothyroxine (SYNTHROID, LEVOTHROID) 175 MCG tablet Take 1 tablet (175 mcg total) by mouth daily. 05/29/13  Yes Alison Murray, MD  oxyCODONE-acetaminophen (ROXICET) 5-325 MG per tablet  Take 1 tablet by mouth every 8 (eight) hours as needed for severe pain. 12/05/13  Yes Carney LivingMarshall L Chambliss, MD   BP 129/81  Pulse 95  Temp(Src) 97.9 F (36.6 C) (Oral)  Resp 16  SpO2 97% Physical Exam  Nursing note and vitals reviewed. Constitutional: She is oriented to person, place, and time. She appears well-developed and well-nourished. No distress.  HENT:  Head: Normocephalic and  atraumatic.  Mouth/Throat: Oropharynx is clear and moist. No oropharyngeal exudate.  Eyes: Conjunctivae and EOM are normal. Pupils are equal, round, and reactive to light. Right eye exhibits no discharge. Left eye exhibits no discharge.  Neck: Normal range of motion. Neck supple. No tracheal deviation present.  Cardiovascular: Normal rate, regular rhythm and normal heart sounds.  Exam reveals no friction rub.   No murmur heard. Pulses:      Radial pulses are 2+ on the right side, and 2+ on the left side.       Dorsalis pedis pulses are 2+ on the right side, and 2+ on the left side.  Pulmonary/Chest: Effort normal and breath sounds normal. No respiratory distress. She has no wheezes. She has no rales.  Abdominal: Soft. Bowel sounds are normal. She exhibits no distension. There is tenderness in the epigastric area. There is no rebound and no guarding.  Obese Discomfort upon palpation to the epigastric region and right upper quadrant Negative guarding  Musculoskeletal: Normal range of motion.  Lymphadenopathy:    She has no cervical adenopathy.  Neurological: She is alert and oriented to person, place, and time. No cranial nerve deficit. She exhibits normal muscle tone. Coordination normal.  Skin: Skin is warm and dry. No rash noted. She is not diaphoretic. No erythema.  Psychiatric: She has a normal mood and affect. Her behavior is normal. Thought content normal.    ED Course  Procedures (including critical care time)  12:26 PM This provider was made aware by CT tech that when she brought the contrast PO to the patient reported that she breaks out into hives and that she cannot take the contrast. CT abdomen and pelvis without PO or IV contrast ordered. This provider spoke with CT tech who verbally understood and agreed.   3:49 PM This provider spoke with Dr. Raymond Gurney. [place] - discussed case,history, presentation, labs, imaging in great detail. Patient to be admitted as observation MedSurg for acute  pancreatitis.   Results for orders placed during the hospital encounter of 12/06/13  CBC WITH DIFFERENTIAL      Result Value Ref Range   WBC 14.1 (*) 4.0 - 10.5 K/uL   RBC 4.87  3.87 - 5.11 MIL/uL   Hemoglobin 13.4  12.0 - 15.0 g/dL   HCT 16.139.8  09.636.0 - 04.546.0 %   MCV 81.7  78.0 - 100.0 fL   MCH 27.5  26.0 - 34.0 pg   MCHC 33.7  30.0 - 36.0 g/dL   RDW 40.914.8  81.111.5 - 91.415.5 %   Platelets 196  150 - 400 K/uL   Neutrophils Relative % 84 (*) 43 - 77 %   Lymphocytes Relative 10 (*) 12 - 46 %   Monocytes Relative 6  3 - 12 %   Eosinophils Relative 0  0 - 5 %   Basophils Relative 0  0 - 1 %   Neutro Abs 11.9 (*) 1.7 - 7.7 K/uL   Lymphs Abs 1.4  0.7 - 4.0 K/uL   Monocytes Absolute 0.8  0.1 - 1.0 K/uL   Eosinophils  Absolute 0.0  0.0 - 0.7 K/uL   Basophils Absolute 0.0  0.0 - 0.1 K/uL   WBC Morphology VACUOLATED NEUTROPHILS     Smear Review LARGE PLATELETS PRESENT    COMPREHENSIVE METABOLIC PANEL      Result Value Ref Range   Sodium 134 (*) 137 - 147 mEq/L   Potassium 4.1  3.7 - 5.3 mEq/L   Chloride 96  96 - 112 mEq/L   CO2 22  19 - 32 mEq/L   Glucose, Bld 83  70 - 99 mg/dL   BUN 12  6 - 23 mg/dL   Creatinine, Ser 7.82  0.50 - 1.10 mg/dL   Calcium 9.4  8.4 - 95.6 mg/dL   Total Protein 8.3  6.0 - 8.3 g/dL   Albumin 3.4 (*) 3.5 - 5.2 g/dL   AST 12  0 - 37 U/L   ALT 9  0 - 35 U/L   Alkaline Phosphatase 82  39 - 117 U/L   Total Bilirubin 0.4  0.3 - 1.2 mg/dL   GFR calc non Af Amer >90  >90 mL/min   GFR calc Af Amer >90  >90 mL/min  LIPASE, BLOOD      Result Value Ref Range   Lipase 68 (*) 11 - 59 U/L  URINALYSIS, ROUTINE W REFLEX MICROSCOPIC      Result Value Ref Range   Color, Urine AMBER (*) YELLOW   APPearance CLOUDY (*) CLEAR   Specific Gravity, Urine 1.028  1.005 - 1.030   pH 6.0  5.0 - 8.0   Glucose, UA NEGATIVE  NEGATIVE mg/dL   Hgb urine dipstick LARGE (*) NEGATIVE   Bilirubin Urine MODERATE (*) NEGATIVE   Ketones, ur 40 (*) NEGATIVE mg/dL   Protein, ur 30 (*) NEGATIVE  mg/dL   Urobilinogen, UA 1.0  0.0 - 1.0 mg/dL   Nitrite NEGATIVE  NEGATIVE   Leukocytes, UA TRACE (*) NEGATIVE  URINE MICROSCOPIC-ADD ON      Result Value Ref Range   Squamous Epithelial / LPF FEW (*) RARE   WBC, UA 3-6  <3 WBC/hpf   RBC / HPF 11-20  <3 RBC/hpf   Bacteria, UA FEW (*) RARE   Casts GRANULAR CAST (*) NEGATIVE   Urine-Other MUCOUS PRESENT    AMYLASE      Result Value Ref Range   Amylase 43  0 - 105 U/L  PREGNANCY, URINE      Result Value Ref Range   Preg Test, Ur NEGATIVE  NEGATIVE    Labs Review Labs Reviewed  CBC WITH DIFFERENTIAL - Abnormal; Notable for the following:    WBC 14.1 (*)    Neutrophils Relative % 84 (*)    Lymphocytes Relative 10 (*)    Neutro Abs 11.9 (*)    All other components within normal limits  COMPREHENSIVE METABOLIC PANEL - Abnormal; Notable for the following:    Sodium 134 (*)    Albumin 3.4 (*)    All other components within normal limits  LIPASE, BLOOD - Abnormal; Notable for the following:    Lipase 68 (*)    All other components within normal limits  URINALYSIS, ROUTINE W REFLEX MICROSCOPIC - Abnormal; Notable for the following:    Color, Urine AMBER (*)    APPearance CLOUDY (*)    Hgb urine dipstick LARGE (*)    Bilirubin Urine MODERATE (*)    Ketones, ur 40 (*)    Protein, ur 30 (*)    Leukocytes, UA TRACE (*)  All other components within normal limits  URINE MICROSCOPIC-ADD ON - Abnormal; Notable for the following:    Squamous Epithelial / LPF FEW (*)    Bacteria, UA FEW (*)    Casts GRANULAR CAST (*)    All other components within normal limits  AMYLASE  PREGNANCY, URINE    Imaging Review Ct Abdomen Pelvis Wo Contrast  12/06/2013   CLINICAL DATA:  ABDOMINAL PAIN  EXAM: CT ABDOMEN AND PELVIS WITHOUT CONTRAST  TECHNIQUE: Multidetector CT imaging of the abdomen and pelvis was performed following the standard protocol without intravenous contrast.  COMPARISON:  US ABDOMEN COMPLETE dated 12/06/2013; CT ABD/PELV WO CM  dated 05/08/2013  FINDINGS: Stable benign liver cyst.  Nonobstructing calculus right kidney  Diverticulosis within the colon Stable small right breast nodule likely a benign fibroadenoma.  A stable benign cyst adjacent to the gallbladder fossa is appreciated within the liver which is otherwise unremarkable. The spleen, and adrenals are unremarkable.  A stable 3 mm calculus upper pole right kidney, and a stable left peripelvic cyst. The kidneys otherwise unremarkable.  Diverticulosis appreciated within the descending and sigmoid portions of the colon. A moderate amount of stool is appreciated within the ascending colon. There is no evidence of bowel obstruction. The the bowel is otherwise negative.  There is loss of the normal undulation of the pancreatic border and diffuse edematous enlargement. Inflammatory changes are appreciated within the peripancreatic fat. Trace areas of peripancreatic fluid appreciated. No associated loculated fluid collections.  There is no evidence of abdominal aortic aneurysm. Mural atherosclerotic calcifications are appreciated.  No abdominal wall or inguinal hernia.  In the right adnexal region, a 5 x 4.2 cm low attenuating mass with Hounsfield units of 16 is appreciated. This may represent an ovarian cyst. Further evaluation with pelvic ultrasound is recommended.  There are no aggressive appearing osseous lesions. Mild multilevel spondylosis is appreciated.  IMPRESSION: Findings consistent with pancreatitis. There is no evidence of a pancreatic pseudocyst.  Stable renal and hepatic cysts  Nonobstructing medullary calculus right kidney  Diverticulosis within the colon  Indeterminate right adnexal mass differential considerations include an ovarian cyst. Further evaluation with pelvic ultrasound recommended. This finding has developed in the interim when compared to the previous study.  Likely benign fibroadenoma right breast   Electronically Signed   By: Salome Holmes M.D.   On:  12/06/2013 13:21   US Abdomen Complete  12/06/2013   CLINICAL DATA:  abdominal pain, ruq  EXAM: ULTRASOUND ABDOMEN COMPLETE  COMPARISON:  None.  FINDINGS: Gallbladder:  Sludge within the gallbladder with very small punctate echogenic foci possibly representing very small calculi. No wall thickening visualized, measuring is 2 mm. No sonographic Murphy sign noted.  Common bile duct:  Diameter: 4 mm  Liver:  A stable benign liver cyst within the gallbladder fossa region. Within normal limits in parenchymal echogenicity.  IVC:  No abnormality visualized.  Pancreas:  Visualized portion unremarkable.  Spleen:  Size and appearance within normal limits.  Right Kidney:  Length: 12.4 cm. Echogenicity within normal limits. No mass or hydronephrosis visualized.  Left Kidney:  Length: 10.7 cm. Echogenicity within normal limits. Benign 1.7 cm lower pole cyst. Otherwise no further evidence of mass or hydronephrosis visualized.  Abdominal aorta:  No aneurysm visualized.  Other findings:  None.  IMPRESSION: Sludge within the gallbladder with possibly small gallstones. No evidence of cholecystitis.  Benign cysts within the liver and left kidney   Electronically Signed   By: Elgie Congo.D.  On: 12/06/2013 12:34   [place] Transvaginal Non-ob  12/06/2013   CLINICAL DATA:  Right adnexal mass.  EXAM: TRANSABDOMINAL AND TRANSVAGINAL ULTRASOUND OF PELVIS  TECHNIQUE: Both transabdominal and transvaginal ultrasound examinations of the pelvis were performed. Transabdominal technique was performed for global imaging of the pelvis including uterus, ovaries, adnexal regions, and pelvic cul-de-sac. It was necessary to proceed with endovaginal exam following the transabdominal exam to visualize the uterus and ovaries.  COMPARISON:  CT 12/06/2013.  CT 05/08/2013.  FINDINGS: Uterus  Hysterectomy.  Right ovary  Measurements: 4.1 x 4.1 x 3.3 cm. 3.1 x 2.9 x 3.0 cm cyst. Adjacent 3.5 x 2.6 x 2.0 cm cyst.  Left ovary  Measurements: 1.4 x 1.2 x 1.8  cm. Ovary appeared normal. 4.5 cm long tubular fluid-filled structure on the left is present consistent hydrosalpinx.  Other findings  No free fluid.  IMPRESSION: 1. Simple cysts  right ovary.  2.  Left hydrosalpinx.   Electronically Signed   By: Maisie Fushomas  Register   On: 12/06/2013 15:33   [place] Pelvis Complete  12/06/2013   CLINICAL DATA:  Right adnexal mass.  EXAM: TRANSABDOMINAL AND TRANSVAGINAL ULTRASOUND OF PELVIS  TECHNIQUE: Both transabdominal and transvaginal ultrasound examinations of the pelvis were performed. Transabdominal technique was performed for global imaging of the pelvis including uterus, ovaries, adnexal regions, and pelvic cul-de-sac. It was necessary to proceed with endovaginal exam following the transabdominal exam to visualize the uterus and ovaries.  COMPARISON:  CT 12/06/2013.  CT 05/08/2013.  FINDINGS: Uterus  Hysterectomy.  Right ovary  Measurements: 4.1 x 4.1 x 3.3 cm. 3.1 x 2.9 x 3.0 cm cyst. Adjacent 3.5 x 2.6 x 2.0 cm cyst.  Left ovary  Measurements: 1.4 x 1.2 x 1.8 cm. Ovary appeared normal. 4.5 cm long tubular fluid-filled structure on the left is present consistent hydrosalpinx.  Other findings  No free fluid.  IMPRESSION: 1. Simple cysts  right ovary.  2.  Left hydrosalpinx.   Electronically Signed   By: Maisie Fushomas  Register   On: 12/06/2013 15:33     EKG Interpretation None      MDM   Final diagnoses:  Acute pancreatitis  Dehydration   Medications  sodium chloride 0.9 % bolus 1,000 mL (not administered)  sodium chloride 0.9 % bolus 1,000 mL (0 mLs Intravenous Stopped 12/06/13 1324)  ibuprofen (ADVIL,MOTRIN) tablet 800 mg (800 mg Oral Given 12/06/13 1322)   Filed Vitals:   12/06/13 0948 12/06/13 1134 12/06/13 1426  BP: 128/95 143/90 129/81  Pulse: 107 90 95  Temp: 97.9 F (36.6 C)    TempSrc: Oral    Resp: 17 18 16   SpO2: 100% 99% 97%    CBC noted elevation white blood cell count of 14.1 with elevated neutrophils at 84. CMP noted mildly low sodium of 134.  Kidney and liver functioning well. Lipase mildly elevated at 68 when compared to one day ago patient was 88. Amylase negative elevation. Urinalysis noted large hemoglobin, moderate bilirubin, trace of leukocytes and granular casts. Abdominal US noted sludge within the gallbladder with small gallstones, no evidence of cholecystitis. Benign cysts within the liver and left kidney. CT abdomen and pelvis without contrast identified findings consistent with pancreatitis-there is no evidence of pancreatic pseudocyst. Ultrasound pelvic identified cysts of the right ovary and left hydrosalpinx. Patient given IV fluids while in the ED setting. Patient refused IV pain medications and anti-emetics. Patient preferred Ibuprofen. Patient diagnosed with acute pancreatitis, leukocytosis, and left hydrosalpinx. Patient dehydrated. Patient would like to be  admitted - unable to eat. Admitted to MedSurg floor under the care of Dr Raymond Gurney. Patient stable, afebrile. Patient stable for transfer.    Raymon Mutton, PA-C 12/06/13 1728

## 2013-12-06 NOTE — ED Notes (Signed)
Called to give report to 3rd floor. Nurse will call back

## 2013-12-06 NOTE — Telephone Encounter (Signed)
Called patient to check on how they were feeling Patient not available Unable to leave message-no voice mail

## 2013-12-06 NOTE — ED Provider Notes (Signed)
Medical screening examination/treatment/procedure(s) were conducted as a shared visit with non-physician practitioner(s) and myself.  I personally evaluated the patient during the encounter.  Epigastric pain and nausea similar to previous episodes of pancreatitis. Soft, mild TTP epigastrium. No RUQ tenderness.   EKG Interpretation None       Glynn OctaveStephen Alfrieda Tarry, MD 12/06/13 [phone]

## 2013-12-06 NOTE — ED Notes (Signed)
Pt presents to ed with c/o abdominal pain and nausea, was seen by her primary for same yesterday and was told to come to ED if it worsens. Pt has hx of pancreatitis.

## 2013-12-06 NOTE — H&P (Signed)
[facility] History and Physical  Dana Martin ZOX:[id number] DOB: 05/20/1966 DOA: 12/06/2013  Referring physician/PA: Raymon MuttonMarissa Sciacca PA PCP: Dana Martin, Dana Martin, Dana Martin   Chief Complaint: Abdominal discomfort  HPI: Dana Martin is a 48 y.o. female  With history of prior pancreatitis etiology unknown per patient, hypothyroidism, and seasonal allergies. Presents to the ED complaining of approximately 5 days of abdominal discomfort. The abdominal discomfort has been persistent and is progressively getting worse. Patient reports poor oral intake as a result. Denies any sick contacts or fevers. Denies any bright red blood per rectum or melena. Eating makes it worse. Patient has been taking Advil for relief.  In the ED patient was found to have elevated lipase and CT findings consistent with pancreatitis as such we were consulted for admission evaluation and recommendations related to acute pancreatitis flare.   Review of Systems:  Constitutional:  No weight loss, night sweats, Fevers, chills, fatigue.  HEENT:  No headaches, Difficulty swallowing,Tooth/dental problems,Sore throat,  No sneezing, itching, ear ache, nasal congestion, post nasal drip,  Cardio-vascular:  No chest pain, Orthopnea, PND, swelling in lower extremities, anasarca, dizziness, palpitations  GI:  No heartburn, indigestion, + abdominal pain, + nausea, + vomiting, diarrhea, change in bowel habits, loss of appetite  Resp:  No shortness of breath with exertion or at rest. No excess mucus, no productive cough, No non-productive cough, No coughing up of blood.No change in color of mucus.No wheezing.No chest wall deformity  Skin:  no rash or lesions.  GU:  no dysuria, change in color of urine, no urgency or frequency. No flank pain.  Musculoskeletal:  No joint pain or swelling. No decreased range of motion. No back pain.  Psych:  No change in mood or affect. No depression or anxiety. No memory loss.   Past Medical  History  Diagnosis Date  . Graves disease   . Pancreatitis    Past Surgical History  Procedure Laterality Date  . Abdominal hysterectomy    . Breast lumpectomy      benign -- fibroid   Social History:  reports that she quit smoking about 6 months ago. She has never used smokeless tobacco. She reports that she drinks alcohol. She reports that she does not use illicit drugs.  Allergies  Allergen Reactions  . Omnipaque [Iohexol] Hives    Pt is ok with premedication (13 hour prep)  . Other Nausea And Vomiting    Any narcotics    History reviewed. No pertinent family history.   Prior to Admission medications   Medication Sig Start Date End Date Taking? Authorizing Provider  cetirizine (ZYRTEC) 10 MG tablet Take 1 tablet (10 mg total) by mouth daily. 12/05/13  Yes Dana Martin, Dana Martin  diphenoxylate-atropine (LOMOTIL) 2.5-0.025 MG per tablet Take 1 tablet by mouth 4 (four) times daily as needed for diarrhea or loose stools.   Yes Historical Provider, Dana Martin  ibuprofen (ADVIL,MOTRIN) 200 MG tablet Take 400 mg by mouth every 6 (six) hours as needed.   Yes Historical Provider, Dana Martin  lansoprazole (PREVACID) 30 MG capsule Take 1 capsule (30 mg total) by mouth daily. 05/29/13  Yes Dana Martin, Dana Martin  levothyroxine (SYNTHROID, LEVOTHROID) 175 MCG tablet Take 1 tablet (175 mcg total) by mouth daily. 05/29/13  Yes Dana Martin, Dana Martin  oxyCODONE-acetaminophen (ROXICET) 5-325 MG per tablet Take 1 tablet by mouth every 8 (eight) hours as needed for severe pain. 12/05/13  Yes Dana Martin, Dana Martin   Physical Exam: Filed Vitals:   12/06/13  1646  BP: 140/87  Pulse: 92  Temp: 98.2 F (36.8 C)  Resp: 14    BP 140/87  Pulse 92  Temp(Src) 98.2 F (36.8 C) (Oral)  Resp 14  SpO2 94%  General:  Appears calm and comfortable Eyes: PERRL, normal lids, irises & conjunctiva ENT: grossly normal hearing, lips & tongue Neck: no LAD, masses or thyromegaly Cardiovascular: RRR, no m/r/g. No LE  edema. Telemetry: SR, no arrhythmias  Respiratory: CTA bilaterally, no w/r/r. Normal respiratory effort. Abdomen: soft, hypoactive bowel sounds, epigastric tenderness on palpation, nondistended Skin: no rash or induration seen on limited exam Musculoskeletal: grossly normal tone BUE/BLE Psychiatric: grossly normal mood and affect, speech fluent and appropriate Neurologic: Answers questions appropriately, moves extremities equally, no facial asymmetry           Labs on Admission:  Basic Metabolic Panel:  Recent Labs Lab 12/06/13 1007  NA 134*  K 4.1  CL 96  CO2 22  GLUCOSE 83  BUN 12  CREATININE 0.56  CALCIUM 9.4   Liver Function Tests:  Recent Labs Lab 12/06/13 1007  AST 12  ALT 9  ALKPHOS 82  BILITOT 0.4  PROT 8.3  ALBUMIN 3.4*    Recent Labs Lab 12/05/13 1029 12/06/13 1007  LIPASE 88* 68*  AMYLASE  --  43   No results found for this basename: AMMONIA,  in the last 168 hours CBC:  Recent Labs Lab 12/05/13 1029 12/06/13 1007  WBC 13.7* 14.1*  NEUTROABS 10.7* 11.9*  HGB 13.0 13.4  HCT 38.7 39.8  MCV 79.0 81.7  PLT 203 196   Cardiac Enzymes: No results found for this basename: CKTOTAL, CKMB, CKMBINDEX, TROPONINI,  in the last 168 hours  BNP (last 3 results) No results found for this basename: PROBNP,  in the last 8760 hours CBG: No results found for this basename: GLUCAP,  in the last 168 hours  Radiological Exams on Admission: Ct Abdomen Pelvis Wo Contrast  12/06/2013   CLINICAL DATA:  ABDOMINAL PAIN  EXAM: CT ABDOMEN AND PELVIS WITHOUT CONTRAST  TECHNIQUE: Multidetector CT imaging of the abdomen and pelvis was performed following the standard protocol without intravenous contrast.  COMPARISON:  US ABDOMEN COMPLETE dated 12/06/2013; CT ABD/PELV WO CM dated 05/08/2013  FINDINGS: Stable benign liver cyst.  Nonobstructing calculus right kidney  Diverticulosis within the colon Stable small right breast nodule likely a benign fibroadenoma.  A stable benign  cyst adjacent to the gallbladder fossa is appreciated within the liver which is otherwise unremarkable. The spleen, and adrenals are unremarkable.  A stable 3 mm calculus upper pole right kidney, and a stable left peripelvic cyst. The kidneys otherwise unremarkable.  Diverticulosis appreciated within the descending and sigmoid portions of the colon. A moderate amount of stool is appreciated within the ascending colon. There is no evidence of bowel obstruction. The the bowel is otherwise negative.  There is loss of the normal undulation of the pancreatic border and diffuse edematous enlargement. Inflammatory changes are appreciated within the peripancreatic fat. Trace areas of peripancreatic fluid appreciated. No associated loculated fluid collections.  There is no evidence of abdominal aortic aneurysm. Mural atherosclerotic calcifications are appreciated.  No abdominal wall or inguinal hernia.  In the right adnexal region, a 5 x 4.2 cm low attenuating mass with Hounsfield units of 16 is appreciated. This may represent an ovarian cyst. Further evaluation with pelvic ultrasound is recommended.  There are no aggressive appearing osseous lesions. Mild multilevel spondylosis is appreciated.  IMPRESSION: Findings consistent with  pancreatitis. There is no evidence of a pancreatic pseudocyst.  Stable renal and hepatic cysts  Nonobstructing medullary calculus right kidney  Diverticulosis within the colon  Indeterminate right adnexal mass differential considerations include an ovarian cyst. Further evaluation with pelvic ultrasound recommended. This finding has developed in the interim when compared to the previous study.  Likely benign fibroadenoma right breast   Electronically Signed   By: Salome HolmesHector  Cooper M.D.   On: 12/06/2013 13:21   [place] Abdomen Complete  12/06/2013   CLINICAL DATA:  abdominal pain, ruq  EXAM: ULTRASOUND ABDOMEN COMPLETE  COMPARISON:  None.  FINDINGS: Gallbladder:  Sludge within the gallbladder with very  small punctate echogenic foci possibly representing very small calculi. No wall thickening visualized, measuring is 2 mm. No sonographic Murphy sign noted.  Common bile duct:  Diameter: 4 mm  Liver:  A stable benign liver cyst within the gallbladder fossa region. Within normal limits in parenchymal echogenicity.  IVC:  No abnormality visualized.  Pancreas:  Visualized portion unremarkable.  Spleen:  Size and appearance within normal limits.  Right Kidney:  Length: 12.4 cm. Echogenicity within normal limits. No mass or hydronephrosis visualized.  Left Kidney:  Length: 10.7 cm. Echogenicity within normal limits. Benign 1.7 cm lower pole cyst. Otherwise no further evidence of mass or hydronephrosis visualized.  Abdominal aorta:  No aneurysm visualized.  Other findings:  None.  IMPRESSION: Sludge within the gallbladder with possibly small gallstones. No evidence of cholecystitis.  Benign cysts within the liver and left kidney   Electronically Signed   By: Salome HolmesHector  Cooper M.D.   On: 12/06/2013 12:34   [place] Transvaginal Non-ob  12/06/2013   CLINICAL DATA:  Right adnexal mass.  EXAM: TRANSABDOMINAL AND TRANSVAGINAL ULTRASOUND OF PELVIS  TECHNIQUE: Both transabdominal and transvaginal ultrasound examinations of the pelvis were performed. Transabdominal technique was performed for global imaging of the pelvis including uterus, ovaries, adnexal regions, and pelvic cul-de-sac. It was necessary to proceed with endovaginal exam following the transabdominal exam to visualize the uterus and ovaries.  COMPARISON:  CT 12/06/2013.  CT 05/08/2013.  FINDINGS: Uterus  Hysterectomy.  Right ovary  Measurements: 4.1 x 4.1 x 3.3 cm. 3.1 x 2.9 x 3.0 cm cyst. Adjacent 3.5 x 2.6 x 2.0 cm cyst.  Left ovary  Measurements: 1.4 x 1.2 x 1.8 cm. Ovary appeared normal. 4.5 cm long tubular fluid-filled structure on the left is present consistent hydrosalpinx.  Other findings  No free fluid.  IMPRESSION: 1. Simple cysts  right ovary.  2.  Left  hydrosalpinx.   Electronically Signed   By: Maisie Fushomas  Register   On: 12/06/2013 15:33   [place] Pelvis Complete  12/06/2013   CLINICAL DATA:  Right adnexal mass.  EXAM: TRANSABDOMINAL AND TRANSVAGINAL ULTRASOUND OF PELVIS  TECHNIQUE: Both transabdominal and transvaginal ultrasound examinations of the pelvis were performed. Transabdominal technique was performed for global imaging of the pelvis including uterus, ovaries, adnexal regions, and pelvic cul-de-sac. It was necessary to proceed with endovaginal exam following the transabdominal exam to visualize the uterus and ovaries.  COMPARISON:  CT 12/06/2013.  CT 05/08/2013.  FINDINGS: Uterus  Hysterectomy.  Right ovary  Measurements: 4.1 x 4.1 x 3.3 cm. 3.1 x 2.9 x 3.0 cm cyst. Adjacent 3.5 x 2.6 x 2.0 cm cyst.  Left ovary  Measurements: 1.4 x 1.2 x 1.8 cm. Ovary appeared normal. 4.5 cm long tubular fluid-filled structure on the left is present consistent hydrosalpinx.  Other findings  No free fluid.  IMPRESSION: 1. Simple cysts  right ovary.  2.  Left hydrosalpinx.   Electronically Signed   By: Maisie Fus  Register   On: 12/06/2013 15:33    EKG: Independently reviewed. Sinus rhythm with no ST elevations or depressions  Assessment/Plan Active Problems:   Acute pancreatitis - N.p.o. except sips with meds - Monitor intake and output - Supportive therapy with antiemetics and pain medication - Monitor lipase levels  Allergic rhinitis -Stable we'll hold Zyrtec  Hypothyroidism - Stable continue Synthroid  DVT prophylaxis -Heparin  Code Status: full Family Communication: None at bedside Disposition Plan: With resolution of abdominal discomfort  Time spent: > 55 minutes  Penny Pia [facility] Pager [phone]

## 2013-12-07 LAB — LIPID PANEL
CHOL/HDL RATIO: 4.7 ratio
CHOLESTEROL: 197 mg/dL (ref 0–200)
HDL: 42 mg/dL (ref >39)
LDL Cholesterol: 137 mg/dL — ABNORMAL HIGH (ref 0–99)
TRIGLYCERIDES: 88 mg/dL (ref <150)
VLDL: 18 mg/dL (ref 0–40)

## 2013-12-07 LAB — LIPASE, BLOOD: Lipase: 196 U/L — ABNORMAL HIGH (ref 11–59)

## 2013-12-07 NOTE — Progress Notes (Signed)
[facility] PROGRESS NOTE  Doristine ChurchMerri L Bowditch ZOX:[id number] DOB: September 04, 1965 DOA: 12/06/2013 PCP: Jeanann LewandowskyJEGEDE, OLUGBEMIGA, MD  Assessment/Plan:  Principal Problem:   Acute pancreatitis - Patient's lipase up from prior check. Will continue to check daily - Continue bowel rest today and MIVF - Motrin for pain control. Opiods cause side effect of nausea in patient as such will avoid. - Etiology uncertain. Triglycerides WNL, no offending medications on board.  Active Problems:   Hypothyroidism - stable, continue home regimen.    Allergic rhinitis - stable   Code Status: full Family Communication: discussed with patient and spouse  Disposition Plan: npo and supportive therapy for now. Once lipase trends down and pain ameliorated will advance diet.  Consultants:  None  Procedures:  None  Antibiotics:  None  HPI/Subjective: Patient has no new complaints. Still having abdominal discomfort. Minimal improvement from initial presentation.  Objective: Filed Vitals:   12/07/13 0521  BP: 144/93  Pulse: 88  Temp: 97.9 F (36.6 C)  Resp: 16    Intake/Output Summary (Last 24 hours) at 12/07/13 1109 Last data filed at 12/07/13 0801  Gross per 24 hour  Intake      0 ml  Output      0 ml  Net      0 ml   Filed Weights   12/06/13 2017 12/07/13 0521  Weight: 96.6 kg (212 lb 15.4 oz) 98.4 kg (216 lb 14.9 oz)    Exam:   General:  Pt in NAD, alert and awake  Cardiovascular: RRR, no MRG  Respiratory: CTA BL, no wheezes  Abdomen: soft, ND, pain with epigastric palpation  Musculoskeletal: no cyanosis or clubbing   Data Reviewed: Basic Metabolic Panel:  Recent Labs Lab 12/06/13 1007 12/06/13 1940  NA 134*  --   K 4.1  --   CL 96  --   CO2 22  --   GLUCOSE 83  --   BUN 12  --   CREATININE 0.56 0.46*  CALCIUM 9.4  --   MG  --  1.8  PHOS  --  2.8   Liver Function Tests:  Recent Labs Lab 12/06/13 1007  AST 12  ALT 9  ALKPHOS 82  BILITOT 0.4  PROT 8.3   ALBUMIN 3.4*    Recent Labs Lab 12/05/13 1029 12/06/13 1007 12/07/13 0454  LIPASE 88* 68* 196*  AMYLASE  --  43  --    No results found for this basename: AMMONIA,  in the last 168 hours CBC:  Recent Labs Lab 12/05/13 1029 12/06/13 1007 12/06/13 1940  WBC 13.7* 14.1* 14.8*  NEUTROABS 10.7* 11.9*  --   HGB 13.0 13.4 12.2  HCT 38.7 39.8 36.8  MCV 79.0 81.7 81.1  PLT 203 196 196   Cardiac Enzymes: No results found for this basename: CKTOTAL, CKMB, CKMBINDEX, TROPONINI,  in the last 168 hours BNP (last 3 results) No results found for this basename: PROBNP,  in the last 8760 hours CBG: No results found for this basename: GLUCAP,  in the last 168 hours  No results found for this or any previous visit (from the past 240 hour(s)).   Studies: Ct Abdomen Pelvis Wo Contrast  12/06/2013   CLINICAL DATA:  ABDOMINAL PAIN  EXAM: CT ABDOMEN AND PELVIS WITHOUT CONTRAST  TECHNIQUE: Multidetector CT imaging of the abdomen and pelvis was performed following the standard protocol without intravenous contrast.  COMPARISON:  US ABDOMEN COMPLETE dated 12/06/2013; CT ABD/PELV WO CM dated 05/08/2013  FINDINGS: Stable benign liver  cyst.  Nonobstructing calculus right kidney  Diverticulosis within the colon Stable small right breast nodule likely a benign fibroadenoma.  A stable benign cyst adjacent to the gallbladder fossa is appreciated within the liver which is otherwise unremarkable. The spleen, and adrenals are unremarkable.  A stable 3 mm calculus upper pole right kidney, and a stable left peripelvic cyst. The kidneys otherwise unremarkable.  Diverticulosis appreciated within the descending and sigmoid portions of the colon. A moderate amount of stool is appreciated within the ascending colon. There is no evidence of bowel obstruction. The the bowel is otherwise negative.  There is loss of the normal undulation of the pancreatic border and diffuse edematous enlargement. Inflammatory changes are  appreciated within the peripancreatic fat. Trace areas of peripancreatic fluid appreciated. No associated loculated fluid collections.  There is no evidence of abdominal aortic aneurysm. Mural atherosclerotic calcifications are appreciated.  No abdominal wall or inguinal hernia.  In the right adnexal region, a 5 x 4.2 cm low attenuating mass with Hounsfield units of 16 is appreciated. This may represent an ovarian cyst. Further evaluation with pelvic ultrasound is recommended.  There are no aggressive appearing osseous lesions. Mild multilevel spondylosis is appreciated.  IMPRESSION: Findings consistent with pancreatitis. There is no evidence of a pancreatic pseudocyst.  Stable renal and hepatic cysts  Nonobstructing medullary calculus right kidney  Diverticulosis within the colon  Indeterminate right adnexal mass differential considerations include an ovarian cyst. Further evaluation with pelvic ultrasound recommended. This finding has developed in the interim when compared to the previous study.  Likely benign fibroadenoma right breast   Electronically Signed   By: Salome HolmesHector  Cooper M.D.   On: 12/06/2013 13:21   [place] Abdomen Complete  12/06/2013   CLINICAL DATA:  abdominal pain, ruq  EXAM: ULTRASOUND ABDOMEN COMPLETE  COMPARISON:  None.  FINDINGS: Gallbladder:  Sludge within the gallbladder with very small punctate echogenic foci possibly representing very small calculi. No wall thickening visualized, measuring is 2 mm. No sonographic Murphy sign noted.  Common bile duct:  Diameter: 4 mm  Liver:  A stable benign liver cyst within the gallbladder fossa region. Within normal limits in parenchymal echogenicity.  IVC:  No abnormality visualized.  Pancreas:  Visualized portion unremarkable.  Spleen:  Size and appearance within normal limits.  Right Kidney:  Length: 12.4 cm. Echogenicity within normal limits. No mass or hydronephrosis visualized.  Left Kidney:  Length: 10.7 cm. Echogenicity within normal limits. Benign 1.7  cm lower pole cyst. Otherwise no further evidence of mass or hydronephrosis visualized.  Abdominal aorta:  No aneurysm visualized.  Other findings:  None.  IMPRESSION: Sludge within the gallbladder with possibly small gallstones. No evidence of cholecystitis.  Benign cysts within the liver and left kidney   Electronically Signed   By: Salome HolmesHector  Cooper M.D.   On: 12/06/2013 12:34   [place] Transvaginal Non-ob  12/06/2013   CLINICAL DATA:  Right adnexal mass.  EXAM: TRANSABDOMINAL AND TRANSVAGINAL ULTRASOUND OF PELVIS  TECHNIQUE: Both transabdominal and transvaginal ultrasound examinations of the pelvis were performed. Transabdominal technique was performed for global imaging of the pelvis including uterus, ovaries, adnexal regions, and pelvic cul-de-sac. It was necessary to proceed with endovaginal exam following the transabdominal exam to visualize the uterus and ovaries.  COMPARISON:  CT 12/06/2013.  CT 05/08/2013.  FINDINGS: Uterus  Hysterectomy.  Right ovary  Measurements: 4.1 x 4.1 x 3.3 cm. 3.1 x 2.9 x 3.0 cm cyst. Adjacent 3.5 x 2.6 x 2.0 cm cyst.  Left ovary  Measurements: 1.4 x 1.2 x 1.8 cm. Ovary appeared normal. 4.5 cm long tubular fluid-filled structure on the left is present consistent hydrosalpinx.  Other findings  No free fluid.  IMPRESSION: 1. Simple cysts  right ovary.  2.  Left hydrosalpinx.   Electronically Signed   By: Maisie Fus  Register   On: 12/06/2013 15:33   US Pelvis Complete  12/06/2013   CLINICAL DATA:  Right adnexal mass.  EXAM: TRANSABDOMINAL AND TRANSVAGINAL ULTRASOUND OF PELVIS  TECHNIQUE: Both transabdominal and transvaginal ultrasound examinations of the pelvis were performed. Transabdominal technique was performed for global imaging of the pelvis including uterus, ovaries, adnexal regions, and pelvic cul-de-sac. It was necessary to proceed with endovaginal exam following the transabdominal exam to visualize the uterus and ovaries.  COMPARISON:  CT 12/06/2013.  CT 05/08/2013.  FINDINGS:  Uterus  Hysterectomy.  Right ovary  Measurements: 4.1 x 4.1 x 3.3 cm. 3.1 x 2.9 x 3.0 cm cyst. Adjacent 3.5 x 2.6 x 2.0 cm cyst.  Left ovary  Measurements: 1.4 x 1.2 x 1.8 cm. Ovary appeared normal. 4.5 cm long tubular fluid-filled structure on the left is present consistent hydrosalpinx.  Other findings  No free fluid.  IMPRESSION: 1. Simple cysts  right ovary.  2.  Left hydrosalpinx.   Electronically Signed   By: Maisie Fus  Register   On: 12/06/2013 15:33    Scheduled Meds: . heparin  5,000 Units Subcutaneous 3 times per day  . levothyroxine  175 mcg Oral QAC breakfast  . pantoprazole  40 mg Oral Daily   Continuous Infusions: . dextrose 5 % and 0.9% NaCl 100 mL/hr at 12/06/13 2041     Time spent: > 35 minutes    Penny Pia  [facility] Pager [phone] If 7PM-7AM, please contact night-coverage at [email or web address], password [facility] 12/07/2013, 11:09 AM  LOS: 1 day

## 2013-12-08 LAB — LIPASE, BLOOD: Lipase: 67 U/L — ABNORMAL HIGH (ref 11–59)

## 2013-12-08 MED ORDER — ACETAMINOPHEN 500 MG PO TABS: 1000.0000 mg | ORAL_TABLET | Freq: Four times a day (QID) | ORAL | Status: DC | PRN

## 2013-12-08 MED ORDER — ACETAMINOPHEN 650 MG RE SUPP: 650.0000 mg | Freq: Four times a day (QID) | RECTAL | Status: DC | PRN

## 2013-12-08 NOTE — Progress Notes (Signed)
[facility] PROGRESS NOTE  Dana LEITAO ION:[id number] DOB: [birth year] DOA: 01-04-14 PCP: Jeanann Lewandowsky, MD  Assessment/Plan:  Principal Problem:   Acute pancreatitis - Patient's lipase trending down. Will continue to check daily - Continue bowel rest today and MIVF - Motrin for pain control. Opiods cause side effect of nausea in patient as such will avoid.  May attempt toradol for pain control - Etiology uncertain. Triglycerides WNL, no offending medications on board, no reports of gallstones.  Active Problems:   Hypothyroidism - stable, continue home regimen.    Allergic rhinitis - stable   Code Status: full Family Communication: discussed with patient and spouse  Disposition Plan: npo and supportive therapy for now. Still having significant abdominal discomfort.  Consultants:  None  Procedures:  None  Antibiotics:  None  HPI/Subjective: Patient has no new complaints. No acute issues reported overnight. Still having abdominal discomfort and nausea  Objective: Filed Vitals:   12/08/13 0550  BP: 131/77  Pulse: 81  Temp: 98 F (36.7 C)  Resp: 20    Intake/Output Summary (Last 24 hours) at 12/08/13 1340 Last data filed at 12/08/13 1256  Gross per 24 hour  Intake 3331.67 ml  Output      0 ml  Net 3331.67 ml   Filed Weights   Jan 04, 2014 2017 12/07/13 0521  Weight: 96.6 kg (212 lb 15.4 oz) 98.4 kg (216 lb 14.9 oz)    Exam:   General:  Pt in NAD, alert and awake  Cardiovascular: RRR, no MRG  Respiratory: CTA BL, no wheezes  Abdomen: soft, ND, pain with epigastric palpation  Musculoskeletal: no cyanosis or clubbing   Data Reviewed: Basic Metabolic Panel:  Recent Labs Lab Jan 04, 2014 1007 01/04/2014 1940  NA 134*  --   K 4.1  --   CL 96  --   CO2 22  --   GLUCOSE 83  --   BUN 12  --   CREATININE 0.56 0.46*  CALCIUM 9.4  --   MG  --  1.8  PHOS  --  2.8   Liver Function Tests:  Recent Labs Lab 01-04-2014 1007  AST 12  ALT  9  ALKPHOS 82  BILITOT 0.4  PROT 8.3  ALBUMIN 3.4*    Recent Labs Lab 12/05/13 1029 01-04-2014 1007 12/07/13 0454 12/08/13 0515  LIPASE 88* 68* 196* 67*  AMYLASE  --  43  --   --    No results found for this basename: AMMONIA,  in the last 168 hours CBC:  Recent Labs Lab 12/05/13 1029 2014-01-04 1007 01/04/14 1940  WBC 13.7* 14.1* 14.8*  NEUTROABS 10.7* 11.9*  --   HGB 13.0 13.4 12.2  HCT 38.7 39.8 36.8  MCV 79.0 81.7 81.1  PLT 203 196 196   Cardiac Enzymes: No results found for this basename: CKTOTAL, CKMB, CKMBINDEX, TROPONINI,  in the last 168 hours BNP (last 3 results) No results found for this basename: PROBNP,  in the last 8760 hours CBG: No results found for this basename: GLUCAP,  in the last 168 hours  No results found for this or any previous visit (from the past 240 hour(s)).   Studies: US Transvaginal Non-ob  Jan 04, 2014   CLINICAL DATA:  Right adnexal mass.  EXAM: TRANSABDOMINAL AND TRANSVAGINAL ULTRASOUND OF PELVIS  TECHNIQUE: Both transabdominal and transvaginal ultrasound examinations of the pelvis were performed. Transabdominal technique was performed for global imaging of the pelvis including uterus, ovaries, adnexal regions, and pelvic cul-de-sac. It was necessary to proceed with  endovaginal exam following the transabdominal exam to visualize the uterus and ovaries.  COMPARISON:  CT 12/06/2013.  CT 05/08/2013.  FINDINGS: Uterus  Hysterectomy.  Right ovary  Measurements: 4.1 x 4.1 x 3.3 cm. 3.1 x 2.9 x 3.0 cm cyst. Adjacent 3.5 x 2.6 x 2.0 cm cyst.  Left ovary  Measurements: 1.4 x 1.2 x 1.8 cm. Ovary appeared normal. 4.5 cm long tubular fluid-filled structure on the left is present consistent hydrosalpinx.  Other findings  No free fluid.  IMPRESSION: 1. Simple cysts  right ovary.  2.  Left hydrosalpinx.   Electronically Signed   By: Maisie Fus  Register   On: 12/06/2013 15:33   US Pelvis Complete  12/06/2013   CLINICAL DATA:  Right adnexal mass.  EXAM:  TRANSABDOMINAL AND TRANSVAGINAL ULTRASOUND OF PELVIS  TECHNIQUE: Both transabdominal and transvaginal ultrasound examinations of the pelvis were performed. Transabdominal technique was performed for global imaging of the pelvis including uterus, ovaries, adnexal regions, and pelvic cul-de-sac. It was necessary to proceed with endovaginal exam following the transabdominal exam to visualize the uterus and ovaries.  COMPARISON:  CT 12/06/2013.  CT 05/08/2013.  FINDINGS: Uterus  Hysterectomy.  Right ovary  Measurements: 4.1 x 4.1 x 3.3 cm. 3.1 x 2.9 x 3.0 cm cyst. Adjacent 3.5 x 2.6 x 2.0 cm cyst.  Left ovary  Measurements: 1.4 x 1.2 x 1.8 cm. Ovary appeared normal. 4.5 cm long tubular fluid-filled structure on the left is present consistent hydrosalpinx.  Other findings  No free fluid.  IMPRESSION: 1. Simple cysts  right ovary.  2.  Left hydrosalpinx.   Electronically Signed   By: Maisie Fus  Register   On: 12/06/2013 15:33    Scheduled Meds: . heparin  5,000 Units Subcutaneous 3 times per day  . levothyroxine  175 mcg Oral QAC breakfast  . pantoprazole  40 mg Oral Daily   Continuous Infusions: . dextrose 5 % and 0.9% NaCl 100 mL/hr at 12/08/13 1126     Time spent: > 35 minutes    Penny Pia  [facility] Pager [phone] If 7PM-7AM, please contact night-coverage at [email or web address], password [facility] 12/08/2013, 1:40 PM  LOS: 2 days

## 2013-12-09 LAB — CBC
HCT: 33.6 % — ABNORMAL LOW (ref 36.0–46.0)
HEMOGLOBIN: 11 g/dL — AB (ref 12.0–15.0)
MCH: 26.7 pg (ref 26.0–34.0)
MCHC: 32.7 g/dL (ref 30.0–36.0)
MCV: 81.6 fL (ref 78.0–100.0)
PLATELETS: 214 10*3/uL (ref 150–400)
RBC: 4.12 MIL/uL (ref 3.87–5.11)
RDW: 14.6 % (ref 11.5–15.5)
WBC: 12.4 10*3/uL — AB (ref 4.0–10.5)

## 2013-12-09 LAB — LIPASE, BLOOD: Lipase: 43 U/L (ref 11–59)

## 2013-12-09 MED ORDER — PANCRELIPASE (LIP-PROT-AMYL) 12000-38000 UNITS PO CPEP
1.0000 | ORAL_CAPSULE | Freq: Three times a day (TID) | ORAL | Status: DC
  Administered 2013-12-09 – 2013-12-10 (×3): 1 via ORAL
  Filled 2013-12-09 (×5): qty 1

## 2013-12-09 NOTE — Progress Notes (Signed)
[facility] PROGRESS NOTE  Dana Martin:[id number] DOB: [birth year] DOA: 12/06/2013 PCP: Jeanann Lewandowsky, MD  Assessment/Plan:  Principal Problem:   Acute pancreatitis - Patient's lipase trending down. Will continue to check daily - Etiology uncertain. Triglycerides WNL, no offending medications on board, no reports of gallstones. - Will continue supportive therapy. Cut down MIVF rate - Dietitian to teach patient about low fat diet - Creon with meals  Active Problems:   Hypothyroidism - stable, continue home regimen.    Allergic rhinitis - stable   Code Status: full Family Communication: discussed with patient and spouse  Disposition Plan: once tolerating low fat diet will d/c  Consultants:  dietitian  Procedures:  None  Antibiotics:  None  HPI/Subjective: Patient has no new complaints. No acute issues reported overnight. Abdominal discomfort better now.  Objective: Filed Vitals:   12/09/13 0551  BP: 115/80  Pulse: 78  Temp: 98 F (36.7 C)  Resp: 18    Intake/Output Summary (Last 24 hours) at 12/09/13 1237 Last data filed at 12/09/13 1000  Gross per 24 hour  Intake   2800 ml  Output      0 ml  Net   2800 ml   Filed Weights   12/06/13 2017 12/07/13 0521  Weight: 96.6 kg (212 lb 15.4 oz) 98.4 kg (216 lb 14.9 oz)    Exam:   General:  Pt in NAD, alert and awake  Cardiovascular: RRR, no MRG  Respiratory: CTA BL, no wheezes  Abdomen: soft, ND, pain with epigastric palpation  Musculoskeletal: no cyanosis or clubbing   Data Reviewed: Basic Metabolic Panel:  Recent Labs Lab 12/06/13 1007 12/06/13 1940  NA 134*  --   K 4.1  --   CL 96  --   CO2 22  --   GLUCOSE 83  --   BUN 12  --   CREATININE 0.56 0.46*  CALCIUM 9.4  --   MG  --  1.8  PHOS  --  2.8   Liver Function Tests:  Recent Labs Lab 12/06/13 1007  AST 12  ALT 9  ALKPHOS 82  BILITOT 0.4  PROT 8.3  ALBUMIN 3.4*    Recent Labs Lab 12/05/13 1029  12/06/13 1007 12/07/13 0454 12/08/13 0515 12/09/13 0420  LIPASE 88* 68* 196* 67* 43  AMYLASE  --  43  --   --   --    No results found for this basename: AMMONIA,  in the last 168 hours CBC:  Recent Labs Lab 12/05/13 1029 12/06/13 1007 12/06/13 1940 12/09/13 0420  WBC 13.7* 14.1* 14.8* 12.4*  NEUTROABS 10.7* 11.9*  --   --   HGB 13.0 13.4 12.2 11.0*  HCT 38.7 39.8 36.8 33.6*  MCV 79.0 81.7 81.1 81.6  PLT 203 196 196 214   Cardiac Enzymes: No results found for this basename: CKTOTAL, CKMB, CKMBINDEX, TROPONINI,  in the last 168 hours BNP (last 3 results) No results found for this basename: PROBNP,  in the last 8760 hours CBG: No results found for this basename: GLUCAP,  in the last 168 hours  No results found for this or any previous visit (from the past 240 hour(s)).   Studies: No results found.  Scheduled Meds: . heparin  5,000 Units Subcutaneous 3 times per day  . levothyroxine  175 mcg Oral QAC breakfast  . lipase/protease/amylase  1 capsule Oral TID WC  . pantoprazole  40 mg Oral Daily   Continuous Infusions: . dextrose 5 % and 0.9% NaCl  100 mL/hr at 12/09/13 0747     Time spent: > 35 minutes    Penny Pia  [facility] Pager [phone] If 7PM-7AM, please contact night-coverage at [email or web address], password [facility] 12/09/2013, 12:37 PM  LOS: 3 days

## 2013-12-09 NOTE — Plan of Care (Signed)
Problem: Food- and Nutrition-Related Knowledge Deficit (NB-1.1) Goal: Nutrition education Formal process to instruct or train a patient/client in a skill or to impart knowledge to help patients/clients voluntarily manage or modify food choices and eating behavior to maintain or improve health. Outcome: Completed/Met Date Met:  12/09/13 RD Consult for Low-Fat diet/Pancreatitis Nutrition Therapy  Provided pt with "Pancreatitis Nutrition Therapy" and "Pancreatitis Label Reading Tips". Educated pt on importance of low fat diet for recovery process and overall symptom management.   Diet recall indicated pt consuming high fat baked good/breakfast foods occasionally, but overall attempting to make healthy changes. Has been purchasing low fat sour cream, half and half, and salad dressings. Was willing to limit intake of high fat meat/protein foods  Encouraged pt to read food labels to assist in low fat diet compliance. Pt was able to perform teach back r/t appropriate label reading. Verbalized understanding in importance of portion control.  Expected good compliance. Pt appeared interested and eager to learn appropriate diet changes to make upon d/c.   Please re-consult RD as needed.  Dana Abide MS RD LDN Clinical Dietitian [phone]

## 2013-12-10 LAB — LIPASE, BLOOD: LIPASE: 27 U/L (ref 11–59)

## 2013-12-10 MED ORDER — PANCRELIPASE (LIP-PROT-AMYL) 12000-38000 UNITS PO CPEP: 1.0000 | ORAL_CAPSULE | Freq: Three times a day (TID) | ORAL | Status: DC

## 2013-12-10 NOTE — Progress Notes (Signed)
Patient discharge home with husband, alert and oriented, discharge instructions given, patient verbalize understanding of discharge instructions given, My Chart access declined at this time, Patient in stable condition at this time

## 2013-12-10 NOTE — Discharge Summary (Signed)
Physician Discharge Summary  Dana Martin XBJ:[id number] DOB: 1966-02-02 DOA: 12/06/2013  PCP: Jeanann Lewandowsky, MD  Admit date: 12/06/2013 Discharge date: 12/10/2013  Time spent: > 35 minutes  Recommendations for Outpatient Follow-up:  1. Please be sure to follow up with GI. Recommend further work up as etiology has not been determined for recurrent pancreatitis.  Discharge Diagnoses:  Principal Problem:   Acute pancreatitis Active Problems:   Hypothyroidism   Allergic rhinitis   Discharge Condition: stable  Diet recommendation: Low fat diet.  Filed Weights   12/06/13 2017 12/07/13 0521  Weight: 96.6 kg (212 lb 15.4 oz) 98.4 kg (216 lb 14.9 oz)    History of present illness:  Pt is a 48 y/o who presented complaining of abdominal discomfort and nausea.  Was found to have pancreatitis on CT scan imaging and elevated lipase  Hospital Course:  Principal Problem:  Acute pancreatitis  - Patient's lipase trending down.  - Etiology uncertain. Triglycerides WNL, no offending medications on board, no reports of gallstones.  - Will continue supportive therapy. Cut down MIVF rate  - Dietitian has taught patient about low fat diet  - Creon with meals  - Have secretary set up appointment with Dr. Matthias Hughs after discharge.  Active Problems:  Hypothyroidism  - stable, continue home regimen.   Allergic rhinitis  - stable, pt to continue home regimen.   Procedures:  As listed below  Consultations:  none  Discharge Exam: Filed Vitals:   12/10/13 0514  BP: 154/100  Pulse: 78  Temp: 97.5 F (36.4 C)  Resp: 18    General: Pt in NAD, alert and awake Cardiovascular: no cyanotic extremities, extremities warm,  Respiratory: CTA BL, no wheezes  Discharge Instructions You were cared for by a hospitalist during your hospital stay. If you have any questions about your discharge medications or the care you received while you were in the hospital after you are discharged, you  can call the unit and asked to speak with the hospitalist on call if the hospitalist that took care of you is not available. Once you are discharged, your primary care physician will handle any further medical issues. Please note that NO REFILLS for any discharge medications will be authorized once you are discharged, as it is imperative that you return to your primary care physician (or establish a relationship with a primary care physician if you do not have one) for your aftercare needs so that they can reassess your need for medications and monitor your lab values.  Discharge Orders   Future Orders Complete By Expires   Call MD for:  persistant nausea and vomiting  As directed    Call MD for:  severe uncontrolled pain  As directed    Call MD for:  temperature >100.4  As directed    Diet - low sodium heart healthy  As directed    Discharge instructions  As directed    Increase activity slowly  As directed        Medication List    STOP taking these medications       diphenoxylate-atropine 2.5-0.025 MG per tablet  Commonly known as:  LOMOTIL     oxyCODONE-acetaminophen 5-325 MG per tablet  Commonly known as:  ROXICET      TAKE these medications       cetirizine 10 MG tablet  Commonly known as:  ZYRTEC  Take 1 tablet (10 mg total) by mouth daily.     ibuprofen 200 MG tablet  Commonly  known as:  ADVIL,MOTRIN  Take 400 mg by mouth every 6 (six) hours as needed.     lansoprazole 30 MG capsule  Commonly known as:  PREVACID  Take 1 capsule (30 mg total) by mouth daily.     levothyroxine 175 MCG tablet  Commonly known as:  SYNTHROID, LEVOTHROID  Take 1 tablet (175 mcg total) by mouth daily.     lipase/protease/amylase 78295 UNITS Cpep capsule  Commonly known as:  CREON-12/PANCREASE  Take 1 capsule by mouth 3 (three) times daily with meals.       Allergies  Allergen Reactions  . Omnipaque [Iohexol] Hives    Pt is ok with premedication (13 hour prep)  . Other Nausea And  Vomiting    Any narcotics      The results of significant diagnostics from this hospitalization (including imaging, microbiology, ancillary and laboratory) are listed below for reference.    Significant Diagnostic Studies: Ct Abdomen Pelvis Wo Contrast  12/06/2013   CLINICAL DATA:  ABDOMINAL PAIN  EXAM: CT ABDOMEN AND PELVIS WITHOUT CONTRAST  TECHNIQUE: Multidetector CT imaging of the abdomen and pelvis was performed following the standard protocol without intravenous contrast.  COMPARISON:  US ABDOMEN COMPLETE dated 12/06/2013; CT ABD/PELV WO CM dated 05/08/2013  FINDINGS: Stable benign liver cyst.  Nonobstructing calculus right kidney  Diverticulosis within the colon Stable small right breast nodule likely a benign fibroadenoma.  A stable benign cyst adjacent to the gallbladder fossa is appreciated within the liver which is otherwise unremarkable. The spleen, and adrenals are unremarkable.  A stable 3 mm calculus upper pole right kidney, and a stable left peripelvic cyst. The kidneys otherwise unremarkable.  Diverticulosis appreciated within the descending and sigmoid portions of the colon. A moderate amount of stool is appreciated within the ascending colon. There is no evidence of bowel obstruction. The the bowel is otherwise negative.  There is loss of the normal undulation of the pancreatic border and diffuse edematous enlargement. Inflammatory changes are appreciated within the peripancreatic fat. Trace areas of peripancreatic fluid appreciated. No associated loculated fluid collections.  There is no evidence of abdominal aortic aneurysm. Mural atherosclerotic calcifications are appreciated.  No abdominal wall or inguinal hernia.  In the right adnexal region, a 5 x 4.2 cm low attenuating mass with Hounsfield units of 16 is appreciated. This may represent an ovarian cyst. Further evaluation with pelvic ultrasound is recommended.  There are no aggressive appearing osseous lesions. Mild multilevel  spondylosis is appreciated.  IMPRESSION: Findings consistent with pancreatitis. There is no evidence of a pancreatic pseudocyst.  Stable renal and hepatic cysts  Nonobstructing medullary calculus right kidney  Diverticulosis within the colon  Indeterminate right adnexal mass differential considerations include an ovarian cyst. Further evaluation with pelvic ultrasound recommended. This finding has developed in the interim when compared to the previous study.  Likely benign fibroadenoma right breast   Electronically Signed   By: Salome Holmes M.D.   On: 12/06/2013 13:21   US Abdomen Complete  12/06/2013   CLINICAL DATA:  abdominal pain, ruq  EXAM: ULTRASOUND ABDOMEN COMPLETE  COMPARISON:  None.  FINDINGS: Gallbladder:  Sludge within the gallbladder with very small punctate echogenic foci possibly representing very small calculi. No wall thickening visualized, measuring is 2 mm. No sonographic Murphy sign noted.  Common bile duct:  Diameter: 4 mm  Liver:  A stable benign liver cyst within the gallbladder fossa region. Within normal limits in parenchymal echogenicity.  IVC:  No abnormality visualized.  Pancreas:  Visualized portion unremarkable.  Spleen:  Size and appearance within normal limits.  Right Kidney:  Length: 12.4 cm. Echogenicity within normal limits. No mass or hydronephrosis visualized.  Left Kidney:  Length: 10.7 cm. Echogenicity within normal limits. Benign 1.7 cm lower pole cyst. Otherwise no further evidence of mass or hydronephrosis visualized.  Abdominal aorta:  No aneurysm visualized.  Other findings:  None.  IMPRESSION: Sludge within the gallbladder with possibly small gallstones. No evidence of cholecystitis.  Benign cysts within the liver and left kidney   Electronically Signed   By: Salome Holmes M.D.   On: 12/06/2013 12:34   US Transvaginal Non-ob  12/06/2013   CLINICAL DATA:  Right adnexal mass.  EXAM: TRANSABDOMINAL AND TRANSVAGINAL ULTRASOUND OF PELVIS  TECHNIQUE: Both transabdominal  and transvaginal ultrasound examinations of the pelvis were performed. Transabdominal technique was performed for global imaging of the pelvis including uterus, ovaries, adnexal regions, and pelvic cul-de-sac. It was necessary to proceed with endovaginal exam following the transabdominal exam to visualize the uterus and ovaries.  COMPARISON:  CT 12/06/2013.  CT 05/08/2013.  FINDINGS: Uterus  Hysterectomy.  Right ovary  Measurements: 4.1 x 4.1 x 3.3 cm. 3.1 x 2.9 x 3.0 cm cyst. Adjacent 3.5 x 2.6 x 2.0 cm cyst.  Left ovary  Measurements: 1.4 x 1.2 x 1.8 cm. Ovary appeared normal. 4.5 cm long tubular fluid-filled structure on the left is present consistent hydrosalpinx.  Other findings  No free fluid.  IMPRESSION: 1. Simple cysts  right ovary.  2.  Left hydrosalpinx.   Electronically Signed   By: Maisie Fus  Register   On: 12/06/2013 15:33   US Pelvis Complete  12/06/2013   CLINICAL DATA:  Right adnexal mass.  EXAM: TRANSABDOMINAL AND TRANSVAGINAL ULTRASOUND OF PELVIS  TECHNIQUE: Both transabdominal and transvaginal ultrasound examinations of the pelvis were performed. Transabdominal technique was performed for global imaging of the pelvis including uterus, ovaries, adnexal regions, and pelvic cul-de-sac. It was necessary to proceed with endovaginal exam following the transabdominal exam to visualize the uterus and ovaries.  COMPARISON:  CT 12/06/2013.  CT 05/08/2013.  FINDINGS: Uterus  Hysterectomy.  Right ovary  Measurements: 4.1 x 4.1 x 3.3 cm. 3.1 x 2.9 x 3.0 cm cyst. Adjacent 3.5 x 2.6 x 2.0 cm cyst.  Left ovary  Measurements: 1.4 x 1.2 x 1.8 cm. Ovary appeared normal. 4.5 cm long tubular fluid-filled structure on the left is present consistent hydrosalpinx.  Other findings  No free fluid.  IMPRESSION: 1. Simple cysts  right ovary.  2.  Left hydrosalpinx.   Electronically Signed   By: Maisie Fus  Register   On: 12/06/2013 15:33    Microbiology: No results found for this or any previous visit (from the past 240  hour(s)).   Labs: Basic Metabolic Panel:  Recent Labs Lab 12/06/13 1007 12/06/13 1940  NA 134*  --   K 4.1  --   CL 96  --   CO2 22  --   GLUCOSE 83  --   BUN 12  --   CREATININE 0.56 0.46*  CALCIUM 9.4  --   MG  --  1.8  PHOS  --  2.8   Liver Function Tests:  Recent Labs Lab 12/06/13 1007  AST 12  ALT 9  ALKPHOS 82  BILITOT 0.4  PROT 8.3  ALBUMIN 3.4*    Recent Labs Lab 12/06/13 1007 12/07/13 0454 12/08/13 0515 12/09/13 0420 12/10/13 0438  LIPASE 68* 196* 67* 43 27  AMYLASE 43  --   --   --   --  No results found for this basename: AMMONIA,  in the last 168 hours CBC:  Recent Labs Lab 12/05/13 1029 12/06/13 1007 12/06/13 1940 12/09/13 0420  WBC 13.7* 14.1* 14.8* 12.4*  NEUTROABS 10.7* 11.9*  --   --   HGB 13.0 13.4 12.2 11.0*  HCT 38.7 39.8 36.8 33.6*  MCV 79.0 81.7 81.1 81.6  PLT 203 196 196 214   Cardiac Enzymes: No results found for this basename: CKTOTAL, CKMB, CKMBINDEX, TROPONINI,  in the last 168 hours BNP: BNP (last 3 results) No results found for this basename: PROBNP,  in the last 8760 hours CBG: No results found for this basename: GLUCAP,  in the last 168 hours     Signed:  Penny Pia  [facility] 12/10/2013, 1:05 PM

## 2014-02-11 ENCOUNTER — Encounter (HOSPITAL_COMMUNITY): Payer: Self-pay | Admitting: *Deleted

## 2014-02-14 ENCOUNTER — Encounter (HOSPITAL_COMMUNITY): Payer: Self-pay | Admitting: Pharmacy Technician

## 2014-02-18 ENCOUNTER — Other Ambulatory Visit: Payer: Self-pay | Admitting: Gastroenterology

## 2014-02-18 NOTE — Addendum Note (Signed)
Addended by: Sameeha Rockefeller on: 02/18/2014 12:55 PM   Modules accepted: Orders  

## 2014-02-19 ENCOUNTER — Ambulatory Visit (HOSPITAL_COMMUNITY): Payer: 59 | Admitting: Registered Nurse

## 2014-02-19 ENCOUNTER — Ambulatory Visit (HOSPITAL_COMMUNITY)
Admission: RE | Admit: 2014-02-19 | Discharge: 2014-02-19 | Disposition: A | Discharge status: Home / Self Care | Payer: 59 | Source: Ambulatory Visit | Attending: Gastroenterology | Admitting: Gastroenterology

## 2014-02-19 ENCOUNTER — Encounter (HOSPITAL_COMMUNITY): Payer: 59 | Admitting: Registered Nurse

## 2014-02-19 ENCOUNTER — Encounter (HOSPITAL_COMMUNITY): Payer: Self-pay | Admitting: *Deleted

## 2014-02-19 ENCOUNTER — Encounter (HOSPITAL_COMMUNITY)
Admission: RE | Disposition: A | Discharge status: Home / Self Care | Payer: Self-pay | Source: Ambulatory Visit | Attending: Gastroenterology

## 2014-02-19 DIAGNOSIS — E039 Hypothyroidism, unspecified: Secondary | ICD-10-CM | POA: Insufficient documentation

## 2014-02-19 DIAGNOSIS — Z87891 Personal history of nicotine dependence: Secondary | ICD-10-CM | POA: Insufficient documentation

## 2014-02-19 DIAGNOSIS — K7689 Other specified diseases of liver: Secondary | ICD-10-CM | POA: Insufficient documentation

## 2014-02-19 DIAGNOSIS — K838 Other specified diseases of biliary tract: Secondary | ICD-10-CM | POA: Insufficient documentation

## 2014-02-19 DIAGNOSIS — K219 Gastro-esophageal reflux disease without esophagitis: Secondary | ICD-10-CM | POA: Insufficient documentation

## 2014-02-19 DIAGNOSIS — R109 Unspecified abdominal pain: Secondary | ICD-10-CM | POA: Insufficient documentation

## 2014-02-19 DIAGNOSIS — J449 Chronic obstructive pulmonary disease, unspecified: Secondary | ICD-10-CM | POA: Insufficient documentation

## 2014-02-19 DIAGNOSIS — K859 Acute pancreatitis without necrosis or infection, unspecified: Secondary | ICD-10-CM | POA: Insufficient documentation

## 2014-02-19 DIAGNOSIS — J4489 Other specified chronic obstructive pulmonary disease: Secondary | ICD-10-CM | POA: Insufficient documentation

## 2014-02-19 HISTORY — DX: Chronic obstructive pulmonary disease, unspecified: J44.9

## 2014-02-19 HISTORY — PX: EUS: SHX5427

## 2014-02-19 HISTORY — DX: Gastro-esophageal reflux disease without esophagitis: K21.9

## 2014-02-19 SURGERY — ESOPHAGEAL ENDOSCOPIC ULTRASOUND (EUS) RADIAL
Anesthesia: Monitor Anesthesia Care

## 2014-02-19 MED ORDER — LACTATED RINGERS IV SOLN
INTRAVENOUS | Status: DC
  Administered 2014-02-19: 1000 mL via INTRAVENOUS

## 2014-02-19 MED ORDER — BUTAMBEN-TETRACAINE-BENZOCAINE 2-2-14 % EX AERO
INHALATION_SPRAY | CUTANEOUS | Status: DC | PRN
  Administered 2014-02-19: 2 via TOPICAL

## 2014-02-19 MED ORDER — PROPOFOL 10 MG/ML IV BOLUS
INTRAVENOUS | Status: AC
  Filled 2014-02-19: qty 20

## 2014-02-19 MED ORDER — SODIUM CHLORIDE 0.9 % IV SOLN: INTRAVENOUS | Status: DC

## 2014-02-19 MED ORDER — LIDOCAINE HCL (CARDIAC) 20 MG/ML IV SOLN
INTRAVENOUS | Status: DC | PRN
  Administered 2014-02-19: 100 mg via INTRAVENOUS

## 2014-02-19 MED ORDER — PROPOFOL INFUSION 10 MG/ML OPTIME
INTRAVENOUS | Status: DC | PRN
  Administered 2014-02-19: 100 ug/kg/min via INTRAVENOUS

## 2014-02-19 MED ORDER — KETAMINE HCL 10 MG/ML IJ SOLN
INTRAMUSCULAR | Status: AC
  Filled 2014-02-19: qty 1

## 2014-02-19 MED ORDER — KETAMINE HCL 10 MG/ML IJ SOLN
INTRAMUSCULAR | Status: DC | PRN
  Administered 2014-02-19 (×3): 10 mg via INTRAVENOUS

## 2014-02-19 MED ORDER — MIDAZOLAM HCL 2 MG/2ML IJ SOLN
INTRAMUSCULAR | Status: AC
  Filled 2014-02-19: qty 2

## 2014-02-19 MED ORDER — MIDAZOLAM HCL 5 MG/5ML IJ SOLN
INTRAMUSCULAR | Status: DC | PRN
  Administered 2014-02-19: 2 mg via INTRAVENOUS

## 2014-02-19 MED ORDER — LIDOCAINE HCL (CARDIAC) 20 MG/ML IV SOLN
INTRAVENOUS | Status: AC
  Filled 2014-02-19: qty 5

## 2014-02-19 NOTE — Op Note (Signed)
[facility] [address] [place], [id number]   ENDOSCOPIC ULTRASOUND PROCEDURE REPORT  PATIENT: Dana Martin, Dana L.  MR#: [id number] BIRTHDATE: 28-Jun-1966  GENDER: Female ENDOSCOPIST: Willis ModenaWilliam Allen Basista, MD REFERRED BY:  Manson PasseyAlma Devine, MD PROCEDURE DATE:  02/19/2014 PROCEDURE:   Upper EUS ASA CLASS:      Class II INDICATIONS:   1.  pancreatitis, intermittent EG/RUQ abdominal pain.  MEDICATIONS: MAC sedation, administered by CRNA  DESCRIPTION OF PROCEDURE:   After the risks benefits and alternatives of the procedure were  explained, informed consent was obtained. The patient was then placed in the left, lateral, decubitus postion and IV sedation was administered. Throughout the procedure, the patients blood pressure, pulse and oxygen saturations were monitored continuously.  Under direct visualization, the radial forward-viewing echoendoscope was introduced through the mouth  and advanced to the second portion of the duodenum      .  Water was used as necessary to provide an acoustic interface.  Upon completion of the imaging, water was removed and the patient was sent to the recovery room in satisfactory condition.    FINDINGS:      Somewhat hyperechoic (consistent with fatty pancreas, commonly observed benign finding), but otherwise normal-appearing pancreas.  Specifically, no pancreatic mass, cyst or peripancreatic adenopathy were identified.  No findings suggestive of acute or chronic pancreatitis.  Ampulla normal-appearing by EUS.  Bile duct diminutive and with slightly diffusely thickened wall, likely reactive appearing.  No choledocholithiasis was identified. Gallbladder without wall thickening but did have some gravity-dependent sludge.  3 x 3 cm benign-appearing cyst seen in the left lobe of the liver.  IMPRESSION:     As above.  Suspect patient's intermittent EG/RUQ pain is biliary tract in nature, and there is clinical concern that her pancreatitis  might be biliary tract in nature (despite normal LFTs).  RECOMMENDATIONS:     1.  Watch for potential complications of procedure. 2.  Outpatient surgical consultation for consideration of cholecystectomy. 3.  Follow-up with [facility] on as-needed basis, pending surgical evaluation.   _______________________________ Rosalie DoctoreSignedWillis Modena:  Aleigh Grunden, MD 02/19/2014 12:38 PM   CC:

## 2014-02-19 NOTE — Anesthesia Preprocedure Evaluation (Addendum)
Anesthesia Evaluation  Patient identified by MRN, date of birth, ID band Patient awake    Reviewed: Allergy & Precautions, H&P , NPO status , Patient's Chart, lab work & pertinent test results  Airway Mallampati: II TM Distance: >3 FB     Dental  (+) Dental Advisory Given   Pulmonary COPDformer smoker,  breath sounds clear to auscultation  + decreased breath sounds      Cardiovascular negative cardio ROS  Rhythm:Regular Rate:Normal     Neuro/Psych negative neurological ROS  negative psych ROS   GI/Hepatic negative GI ROS, Neg liver ROS, GERD-  Medicated,  Endo/Other  Hypothyroidism   Renal/GU negative Renal ROS     Musculoskeletal negative musculoskeletal ROS (+)   Abdominal   Peds  Hematology negative hematology ROS (+)   Anesthesia Other Findings   Reproductive/Obstetrics negative OB ROS                          Anesthesia Physical Anesthesia Plan  ASA: II  Anesthesia Plan: MAC   Post-op Pain Management:    Induction: Intravenous  Airway Management Planned: Natural Airway and Nasal Cannula  Additional Equipment:   Intra-op Plan:   Post-operative Plan:   Informed Consent: I have reviewed the patients History and Physical, chart, labs and discussed the procedure including the risks, benefits and alternatives for the proposed anesthesia with the patient or authorized representative who has indicated his/her understanding and acceptance.   Dental advisory given  Plan Discussed with: CRNA  Anesthesia Plan Comments:        Anesthesia Quick Evaluation

## 2014-02-19 NOTE — Anesthesia Postprocedure Evaluation (Signed)
Anesthesia Post Note  Patient: Dana Martin  Procedure(s) Performed: Procedure(s) (LRB): ESOPHAGEAL ENDOSCOPIC ULTRASOUND (EUS) RADIAL (N/A)  Anesthesia type: MAC  Patient location: PACU  Post pain: Pain level controlled  Post assessment: Post-op Vital signs reviewed  Last Vitals: BP 141/84  Pulse 79  Temp(Src) 36.3 C (Oral)  Resp 20  Ht 5\' 5"  (1.651 m)  Wt 195 lb (88.451 kg)  BMI 32.45 kg/m2  SpO2 100%  Post vital signs: Reviewed  Level of consciousness: awake  Complications: No apparent anesthesia complications

## 2014-02-19 NOTE — Discharge Instructions (Addendum)
Esophagogastroduodenoscopy Care After Refer to this sheet in the next few weeks. These instructions provide you with information on caring for yourself after your procedure. Your caregiver may also give you more specific instructions. Your treatment has been planned according to current medical practices, but problems sometimes occur. Call your caregiver if you have any problems or questions after your procedure.  HOME CARE INSTRUCTIONS  Do not eat or drink anything until the numbing medicine (local anesthetic) has worn off and your gag reflex has returned. You will know that the local anesthetic has worn off when you can swallow comfortably.  Do not drive for 12 hours after the procedure or as directed by your caregiver.  Only take medicines as directed by your caregiver. SEEK MEDICAL CARE IF:   You cannot stop coughing.  You are not urinating at all or less than usual. SEEK IMMEDIATE MEDICAL CARE IF:  You have difficulty swallowing.  You cannot eat or drink.  You have worsening throat or chest pain.  You have dizziness, lightheadedness, or you faint.  You have nausea or vomiting.  You have chills.  You have a fever.  You have severe abdominal pain.  You have black, tarry, or bloody stools. Document Released: 07/11/2012 Document Reviewed: 07/11/2012 [facility] Patient Information 2015 [employer], [place]. This information is not intended to replace advice given to you by your health care provider. Make sure you discuss any questions you have with your health care provider.   Patient's husband, Dana Martin, has served as Dana Martin transporter and is to provide home care for the remainder of the day.

## 2014-02-19 NOTE — Transfer of Care (Signed)
Immediate Anesthesia Transfer of Care Note  Patient: Dana Martin  Procedure(s) Performed: Procedure(s): ESOPHAGEAL ENDOSCOPIC ULTRASOUND (EUS) RADIAL (N/A)  Patient Location: PACU and Endoscopy Unit  Anesthesia Type:MAC  Level of Consciousness: awake, alert , oriented and patient cooperative  Airway & Oxygen Therapy: Patient Spontanous Breathing and Patient connected to face mask oxygen  Post-op Assessment: Report given to PACU RN, Post -op Vital signs reviewed and stable and Patient moving all extremities  Post vital signs: Reviewed and stable  Complications: No apparent anesthesia complications

## 2014-02-19 NOTE — H&P (Signed)
Patient interval history reviewed.  Patient examined again.  There has been no change from documented H/P dated 02/06/14 (scanned into chart from our office) except as documented above.  Assessment:  1.  Recurrent acute pancreatitis.  Clinically quiescent at present.  Plan:  1.  Endoscopic ultrasound with possible fine needle aspiration (FNA) biopsies. 2.  Risks (bleeding, infection, bowel perforation that could require surgery, sedation-related changes in cardiopulmonary systems), benefits (identification and possible treatment of source of symptoms, exclusion of certain causes of symptoms), and alternatives (watchful waiting, radiographic imaging studies, empiric medical treatment) of upper endoscopy with ultrasound and possible biopsies (EUS +/- FNA) were explained to patient/family in detail and patient wishes to proceed.

## 2014-02-20 ENCOUNTER — Encounter (HOSPITAL_COMMUNITY): Payer: Self-pay | Admitting: Gastroenterology

## 2014-03-03 ENCOUNTER — Encounter (INDEPENDENT_AMBULATORY_CARE_PROVIDER_SITE_OTHER): Payer: Self-pay | Admitting: Surgery

## 2014-03-24 ENCOUNTER — Ambulatory Visit (INDEPENDENT_AMBULATORY_CARE_PROVIDER_SITE_OTHER): Payer: 59 | Admitting: Surgery

## 2014-03-24 ENCOUNTER — Encounter (INDEPENDENT_AMBULATORY_CARE_PROVIDER_SITE_OTHER): Payer: Self-pay | Admitting: Surgery

## 2014-03-24 VITALS — BP 118/68 | HR 85 | Temp 98.3°F | Ht 65.0 in | Wt 194.0 lb

## 2014-03-24 DIAGNOSIS — K8020 Calculus of gallbladder without cholecystitis without obstruction: Secondary | ICD-10-CM

## 2014-03-24 NOTE — Progress Notes (Signed)
Patient ID: Dana Martin, female   DOB: 08/16/1965, 48 y.o.   MRN: [id number]  Chief Complaint  Patient presents with  . Gallbladder consult    HPI Dana Martin is a 48 y.o. female.   HPI This is a very pleasant female referred by Dr. Dulce Sellar for evaluation of abdominal pain with recurrent bouts of pancreatitis. She has had recurrent bouts of pancreatitis for last 3 years. In the past she has had negative ultrasounds with only CAT scans and laboratory data demonstrated pancreatitis. Her most recent ultrasound however showed gallbladder sludge and perhaps tiny gallstones. She has no history of alcohol use.Her last attack was a month and a half ago. She has had a thorough workup including upper endoscopy as well. Pain can be epigastric into the right upper quadrant. The pain is sharp and moderate in intensity. She has had occasional nausea and vomiting. Past Medical History  Diagnosis Date  . Graves disease   . Pancreatitis   . H/O seasonal allergies   . COPD (chronic obstructive pulmonary disease)     mild-no meds  . GERD (gastroesophageal reflux disease)     Past Surgical History  Procedure Laterality Date  . Breast lumpectomy      benign -- fibroid  . Abdominal hysterectomy      "fibroid tumor"  . Tubal ligation    . Eus N/A 02/19/2014    Procedure: ESOPHAGEAL ENDOSCOPIC ULTRASOUND (EUS) RADIAL;  Surgeon: Willis Modena, MD;  Location: [facility];  Service: Endoscopy;  Laterality: N/A;    History reviewed. No pertinent family history.  Social History History  Substance Use Topics  . Smoking status: Former Smoker    Quit date: 05/08/2013  . Smokeless tobacco: Never Used  . Alcohol Use: Yes     Comment: rarely    Allergies  Allergen Reactions  . Omnipaque [Iohexol] Hives    Pt is ok with premedication (13 hour prep)  . Other Nausea And Vomiting    Any narcotics  . Fish Allergy Hives  . Peanut-Containing Drug Products Hives  . Shellfish Allergy Hives     Current Outpatient Prescriptions  Medication Sig Dispense Refill  . cetirizine (ZYRTEC) 10 MG tablet Take 1 tablet (10 mg total) by mouth daily.  30 tablet  3  . hydroxypropyl methylcellulose (ISOPTO TEARS) 2.5 % ophthalmic solution Place 1 drop into both eyes 3 (three) times daily as needed for dry eyes.      Marland Kitchen ibuprofen (ADVIL,MOTRIN) 200 MG tablet Take 800 mg by mouth every 6 (six) hours as needed for moderate pain.       Marland Kitchen lansoprazole (PREVACID) 30 MG capsule Take 1 capsule (30 mg total) by mouth daily.  30 capsule  3  . levothyroxine (SYNTHROID, LEVOTHROID) 175 MCG tablet Take 175 mcg by mouth daily before breakfast.       No current facility-administered medications for this visit.    Review of Systems Review of Systems  Constitutional: Negative for fever, chills and unexpected weight change.  HENT: Negative for congestion, hearing loss, sore throat, trouble swallowing and voice change.   Eyes: Negative for visual disturbance.  Respiratory: Negative for cough and wheezing.   Cardiovascular: Negative for chest pain, palpitations and leg swelling.  Gastrointestinal: Positive for abdominal pain. Negative for nausea, vomiting, diarrhea, constipation, blood in stool, abdominal distention and anal bleeding.  Genitourinary: Negative for hematuria, vaginal bleeding and difficulty urinating.  Musculoskeletal: Negative for arthralgias.  Skin: Negative for rash and wound.  Neurological: Negative for  seizures, syncope and headaches.  Hematological: Negative for adenopathy. Does not bruise/bleed easily.  Psychiatric/Behavioral: Negative for confusion.    Blood pressure 118/68, pulse 85, temperature 98.3 F (36.8 C), temperature source Oral, height 5\' 5"  (1.651 m), weight 194 lb (87.998 kg).  Physical Exam Physical Exam  Constitutional: She is oriented to person, place, and time. She appears well-developed and well-nourished. No distress.  HENT:  Head: Normocephalic and atraumatic.   Right Ear: External ear normal.  Left Ear: External ear normal.  Nose: Nose normal.  Mouth/Throat: Oropharynx is clear and moist.  Eyes: Conjunctivae are normal. Pupils are equal, round, and reactive to light.  Neck: Normal range of motion. Neck supple. No tracheal deviation present.  Cardiovascular: Normal rate, regular rhythm, normal heart sounds and intact distal pulses.   No murmur heard. Pulmonary/Chest: Effort normal and breath sounds normal. No respiratory distress.  Abdominal: Soft. Bowel sounds are normal. She exhibits no distension. There is no tenderness. There is no rebound.  Musculoskeletal: Normal range of motion. She exhibits no edema and no tenderness.  Lymphadenopathy:    She has no cervical adenopathy.  Neurological: She is alert and oriented to person, place, and time.  Skin: Skin is warm and dry. No rash noted. She is not diaphoretic. No erythema.  Psychiatric: Her behavior is normal. Judgment normal.    Data Reviewed I have reviewed her CAT scan, ultrasound, and laboratory data  Assessment    Symptomatic cholelithiasis with a history of pancreatitis     Plan    I do suspect that the gallbladder sludge and possible small stones are a cause of her pancreatitis and I am recommending a laparoscopic cholecystectomy with cholangiogram.  I discussed the procedure in detail.  The patient was given Agricultural engineer.  We discussed the risks and benefits of a laparoscopic cholecystectomy and cholangiogram including, but not limited to bleeding, infection, injury to surrounding structures such as the intestine or liver, bile leak, retained gallstones, need to convert to an open procedure, prolonged diarrhea, blood clots such as  DVT, common bile duct injury, anesthesia risks, and possible need for additional procedures.  The likelihood of improvement in symptoms and return to the patient's normal status is good. We discussed the typical post-operative recovery course.         Suzan Manon A 03/24/2014, 2:15 PM

## 2014-03-24 NOTE — Patient Instructions (Signed)
Our schedules will call you to schedule surgery

## 2014-05-01 ENCOUNTER — Encounter: Payer: Self-pay | Admitting: Internal Medicine

## 2014-05-01 ENCOUNTER — Ambulatory Visit: Payer: 59 | Attending: Internal Medicine | Admitting: Internal Medicine

## 2014-05-01 VITALS — BP 124/87 | HR 76 | Temp 98.4°F | Resp 15 | Wt 193.0 lb

## 2014-05-01 DIAGNOSIS — J4489 Other specified chronic obstructive pulmonary disease: Secondary | ICD-10-CM | POA: Insufficient documentation

## 2014-05-01 DIAGNOSIS — E0500 Thyrotoxicosis with diffuse goiter without thyrotoxic crisis or storm: Secondary | ICD-10-CM | POA: Insufficient documentation

## 2014-05-01 DIAGNOSIS — R05 Cough: Secondary | ICD-10-CM | POA: Diagnosis not present

## 2014-05-01 DIAGNOSIS — Z9851 Tubal ligation status: Secondary | ICD-10-CM | POA: Insufficient documentation

## 2014-05-01 DIAGNOSIS — Z79899 Other long term (current) drug therapy: Secondary | ICD-10-CM | POA: Diagnosis not present

## 2014-05-01 DIAGNOSIS — Z87891 Personal history of nicotine dependence: Secondary | ICD-10-CM | POA: Insufficient documentation

## 2014-05-01 DIAGNOSIS — J449 Chronic obstructive pulmonary disease, unspecified: Secondary | ICD-10-CM | POA: Diagnosis not present

## 2014-05-01 DIAGNOSIS — Z9109 Other allergy status, other than to drugs and biological substances: Secondary | ICD-10-CM

## 2014-05-01 DIAGNOSIS — K219 Gastro-esophageal reflux disease without esophagitis: Secondary | ICD-10-CM | POA: Insufficient documentation

## 2014-05-01 DIAGNOSIS — Z90710 Acquired absence of both cervix and uterus: Secondary | ICD-10-CM | POA: Diagnosis not present

## 2014-05-01 DIAGNOSIS — J309 Allergic rhinitis, unspecified: Secondary | ICD-10-CM | POA: Diagnosis not present

## 2014-05-01 DIAGNOSIS — R059 Cough, unspecified: Secondary | ICD-10-CM | POA: Insufficient documentation

## 2014-05-01 MED ORDER — LEVOCETIRIZINE DIHYDROCHLORIDE 5 MG PO TABS: 5.0000 mg | ORAL_TABLET | Freq: Every evening | ORAL | Status: DC

## 2014-05-01 MED ORDER — BENZONATATE 100 MG PO CAPS: 100.0000 mg | ORAL_CAPSULE | Freq: Three times a day (TID) | ORAL | Status: DC | PRN

## 2014-05-01 MED ORDER — FLUTICASONE PROPIONATE 50 MCG/ACT NA SUSP: 2.0000 | Freq: Every day | NASAL | Status: DC

## 2014-05-01 NOTE — Progress Notes (Signed)
Patient complains of cough and congestion for the past week Patient is concerned about her cough because she is due to have Surgery in a week or so

## 2014-05-01 NOTE — Progress Notes (Signed)
MRN: [id number] Name: Dana Martin  Sex: female Age: 48 y.o. DOB: 11/23/1965  Allergies: Omnipaque; Other; Fish allergy; Peanut-containing drug products; and Shellfish allergy  Chief Complaint  Patient presents with  . URI    HPI: Patient is 48 y.o. female who complains of nasal congestion postnasal drip coughing for the last  one week, patient has tried over-the-counter cough medication, also she has been taking Zyrtec as per patient she xyzal  in the past which helped her with the symptoms, currently denies any chest and shortness of breath fever chills, denies smoking cigarettes. Patient has history of GERD and is taking lansoprazole.  Past Medical History  Diagnosis Date  . Graves disease   . Pancreatitis   . H/O seasonal allergies   . COPD (chronic obstructive pulmonary disease)     mild-no meds  . GERD (gastroesophageal reflux disease)     Past Surgical History  Procedure Laterality Date  . Breast lumpectomy      benign -- fibroid  . Abdominal hysterectomy      "fibroid tumor"  . Tubal ligation    . Eus N/A 02/19/2014    Procedure: ESOPHAGEAL ENDOSCOPIC ULTRASOUND (EUS) RADIAL;  Surgeon: Willis Modena, MD;  Location: [facility];  Service: Endoscopy;  Laterality: N/A;      Medication List       This list is accurate as of: 05/01/14 10:30 AM.  Always use your most recent med list.               benzonatate 100 MG capsule  Commonly known as:  TESSALON  Take 1 capsule (100 mg total) by mouth 3 (three) times daily as needed for cough.     cetirizine 10 MG tablet  Commonly known as:  ZYRTEC  Take 1 tablet (10 mg total) by mouth daily.     fluticasone 50 MCG/ACT nasal spray  Commonly known as:  FLONASE  Place 2 sprays into both nostrils daily.     hydroxypropyl methylcellulose / hypromellose 2.5 % ophthalmic solution  Commonly known as:  ISOPTO TEARS / GONIOVISC  Place 1 drop into both eyes 3 (three) times daily as needed for dry eyes.     ibuprofen 200 MG tablet  Commonly known as:  ADVIL,MOTRIN  Take 800 mg by mouth every 6 (six) hours as needed for moderate pain.     lansoprazole 30 MG capsule  Commonly known as:  PREVACID  Take 1 capsule (30 mg total) by mouth daily.     levocetirizine 5 MG tablet  Commonly known as:  XYZAL  Take 1 tablet (5 mg total) by mouth every evening.     levothyroxine 175 MCG tablet  Commonly known as:  SYNTHROID, LEVOTHROID  Take 175 mcg by mouth daily before breakfast.        Meds ordered this encounter  Medications  . levocetirizine (XYZAL) 5 MG tablet    Sig: Take 1 tablet (5 mg total) by mouth every evening.    Dispense:  30 tablet    Refill:  3  . fluticasone (FLONASE) 50 MCG/ACT nasal spray    Sig: Place 2 sprays into both nostrils daily.    Dispense:  16 g    Refill:  3  . benzonatate (TESSALON) 100 MG capsule    Sig: Take 1 capsule (100 mg total) by mouth 3 (three) times daily as needed for cough.    Dispense:  30 capsule    Refill:  1  There is no immunization history on file for this patient.  History reviewed. No pertinent family history.  History  Substance Use Topics  . Smoking status: Former Smoker    Quit date: 05/08/2013  . Smokeless tobacco: Never Used  . Alcohol Use: Yes     Comment: rarely    Review of Systems   As noted in HPI  Filed Vitals:   05/01/14 1014  BP: 124/87  Pulse: 76  Temp: 98.4 F (36.9 C)  Resp: 15    Physical Exam  Physical Exam  Constitutional: No distress.  HENT:  Nasal congestion no sinus tenderness   Eyes: EOM are normal. Pupils are equal, round, and reactive to light.  Cardiovascular: Normal rate and regular rhythm.   Pulmonary/Chest: Breath sounds normal. No respiratory distress. She has no wheezes. She has no rales.  Musculoskeletal: She exhibits no edema.  Lymphadenopathy:    She has no cervical adenopathy.    CBC    Component Value Date/Time   WBC 12.4* 12/09/2013 0420   RBC 4.12 12/09/2013 0420    HGB 11.0* 12/09/2013 0420   HCT 33.6* 12/09/2013 0420   PLT 214 12/09/2013 0420   MCV 81.6 12/09/2013 0420   LYMPHSABS 1.4 12/06/2013 1007   MONOABS 0.8 12/06/2013 1007   EOSABS 0.0 12/06/2013 1007   BASOSABS 0.0 12/06/2013 1007    CMP     Component Value Date/Time   NA 134* 12/06/2013 1007   K 4.1 12/06/2013 1007   CL 96 12/06/2013 1007   CO2 22 12/06/2013 1007   GLUCOSE 83 12/06/2013 1007   BUN 12 12/06/2013 1007   CREATININE 0.46* 12/06/2013 1940   CALCIUM 9.4 12/06/2013 1007   PROT 8.3 12/06/2013 1007   ALBUMIN 3.4* 12/06/2013 1007   AST 12 12/06/2013 1007   ALT 9 12/06/2013 1007   ALKPHOS 82 12/06/2013 1007   BILITOT 0.4 12/06/2013 1007   GFRNONAA >90 12/06/2013 1940   GFRAA >90 12/06/2013 1940    Lab Results  Component Value Date/Time   CHOL 197 12/07/2013  4:54 AM    No components found with this basename: hga1c    Lab Results  Component Value Date/Time   AST 12 12/06/2013 10:07 AM    Assessment and Plan  Environmental allergies - Plan: levocetirizine (XYZAL) 5 MG tablet  Allergic rhinitis, unspecified allergic rhinitis type - Plan: fluticasone (FLONASE) 50 MCG/ACT nasal spray  Cough - Plan: benzonatate (TESSALON) 100 MG capsule when necessary for cough Also advise for saltwater gargles.   Return if symptoms worsen or fail to improve.  Doris Cheadle, MD

## 2014-05-08 ENCOUNTER — Encounter (HOSPITAL_BASED_OUTPATIENT_CLINIC_OR_DEPARTMENT_OTHER): Payer: Self-pay | Admitting: *Deleted

## 2014-05-08 DIAGNOSIS — R0981 Nasal congestion: Secondary | ICD-10-CM

## 2014-05-08 DIAGNOSIS — K8020 Calculus of gallbladder without cholecystitis without obstruction: Secondary | ICD-10-CM

## 2014-05-08 HISTORY — DX: Calculus of gallbladder without cholecystitis without obstruction: K80.20

## 2014-05-08 HISTORY — DX: Nasal congestion: R09.81

## 2014-05-12 NOTE — H&P (Signed)
Chief Complaint   Patient presents with   .  Gallbladder consult   HPI  Dana Martin is a 48 y.o. female.  HPI  This is a very pleasant female referred by Dr. Dulce Sellar for evaluation of abdominal pain with recurrent bouts of pancreatitis. She has had recurrent bouts of pancreatitis for last 3 years. In the past she has had negative ultrasounds with only CAT scans and laboratory data demonstrated pancreatitis. Her most recent ultrasound however showed gallbladder sludge and perhaps tiny gallstones. She has no history of alcohol use.Her last attack was a month and a half ago. She has had a thorough workup including upper endoscopy as well. Pain can be epigastric into the right upper quadrant. The pain is sharp and moderate in intensity. She has had occasional nausea and vomiting.  Past Medical History   Diagnosis  Date   .  Graves disease    .  Pancreatitis    .  H/O seasonal allergies    .  COPD (chronic obstructive pulmonary disease)      mild-no meds   .  GERD (gastroesophageal reflux disease)     Past Surgical History   Procedure  Laterality  Date   .  Breast lumpectomy       benign -- fibroid   .  Abdominal hysterectomy       "fibroid tumor"   .  Tubal ligation     .  Eus  N/A  02/19/2014     Procedure: ESOPHAGEAL ENDOSCOPIC ULTRASOUND (EUS) RADIAL; Surgeon: Willis Modena, MD; Location: [facility]; Service: Endoscopy; Laterality: N/A;   History reviewed. No pertinent family history.  Social History  History   Substance Use Topics   .  Smoking status:  Former Smoker     Quit date:  05/08/2013   .  Smokeless tobacco:  Never Used   .  Alcohol Use:  Yes      Comment: rarely    Allergies   Allergen  Reactions   .  Omnipaque [Iohexol]  Hives     Pt is ok with premedication (13 hour prep)   .  Other  Nausea And Vomiting     Any narcotics   .  Fish Allergy  Hives   .  Peanut-Containing Drug Products  Hives   .  Shellfish Allergy  Hives    Current Outpatient Prescriptions    Medication  Sig  Dispense  Refill   .  cetirizine (ZYRTEC) 10 MG tablet  Take 1 tablet (10 mg total) by mouth daily.  30 tablet  3   .  hydroxypropyl methylcellulose (ISOPTO TEARS) 2.5 % ophthalmic solution  Place 1 drop into both eyes 3 (three) times daily as needed for dry eyes.     Marland Kitchen  ibuprofen (ADVIL,MOTRIN) 200 MG tablet  Take 800 mg by mouth every 6 (six) hours as needed for moderate pain.     Marland Kitchen  lansoprazole (PREVACID) 30 MG capsule  Take 1 capsule (30 mg total) by mouth daily.  30 capsule  3   .  levothyroxine (SYNTHROID, LEVOTHROID) 175 MCG tablet  Take 175 mcg by mouth daily before breakfast.      No current facility-administered medications for this visit.   Review of Systems  Review of Systems  Constitutional: Negative for fever, chills and unexpected weight change.  HENT: Negative for congestion, hearing loss, sore throat, trouble swallowing and voice change.  Eyes: Negative for visual disturbance.  Respiratory: Negative for cough and wheezing.  Cardiovascular: Negative for chest pain, palpitations and leg swelling.  Gastrointestinal: Positive for abdominal pain. Negative for nausea, vomiting, diarrhea, constipation, blood in stool, abdominal distention and anal bleeding.  Genitourinary: Negative for hematuria, vaginal bleeding and difficulty urinating.  Musculoskeletal: Negative for arthralgias.  Skin: Negative for rash and wound.  Neurological: Negative for seizures, syncope and headaches.  Hematological: Negative for adenopathy. Does not bruise/bleed easily.  Psychiatric/Behavioral: Negative for confusion.  Blood pressure 118/68, pulse 85, temperature 98.3 F (36.8 C), temperature source Oral, height 5\' 5"  (1.651 m), weight 194 lb (87.998 kg).  Physical Exam  Physical Exam  Constitutional: She is oriented to person, place, and time. She appears well-developed and well-nourished. No distress.  HENT:  Head: Normocephalic and atraumatic.  Right Ear: External ear normal.   Left Ear: External ear normal.  Nose: Nose normal.  Mouth/Throat: Oropharynx is clear and moist.  Eyes: Conjunctivae are normal. Pupils are equal, round, and reactive to light.  Neck: Normal range of motion. Neck supple. No tracheal deviation present.  Cardiovascular: Normal rate, regular rhythm, normal heart sounds and intact distal pulses.  No murmur heard.  Pulmonary/Chest: Effort normal and breath sounds normal. No respiratory distress.  Abdominal: Soft. Bowel sounds are normal. She exhibits no distension. There is no tenderness. There is no rebound.  Musculoskeletal: Normal range of motion. She exhibits no edema and no tenderness.  Lymphadenopathy:  She has no cervical adenopathy.  Neurological: She is alert and oriented to person, place, and time.  Skin: Skin is warm and dry. No rash noted. She is not diaphoretic. No erythema.  Psychiatric: Her behavior is normal. Judgment normal.  Data Reviewed  I have reviewed her CAT scan, ultrasound, and laboratory data  Assessment  Symptomatic cholelithiasis with a history of pancreatitis  Plan  I do suspect that the gallbladder sludge and possible small stones are a cause of her pancreatitis and I am recommending a laparoscopic cholecystectomy with cholangiogram. I discussed the procedure in detail. The patient was given Agricultural engineereducational material. We discussed the risks and benefits of a laparoscopic cholecystectomy and cholangiogram including, but not limited to bleeding, infection, injury to surrounding structures such as the intestine or liver, bile leak, retained gallstones, need to convert to an open procedure, prolonged diarrhea, blood clots such as DVT, common bile duct injury, anesthesia risks, and possible need for additional procedures. The likelihood of improvement in symptoms and return to the patient's normal status is good. We discussed the typical post-operative recovery course.

## 2014-05-13 ENCOUNTER — Encounter (HOSPITAL_BASED_OUTPATIENT_CLINIC_OR_DEPARTMENT_OTHER): Payer: 59 | Admitting: Certified Registered"

## 2014-05-13 ENCOUNTER — Ambulatory Visit (HOSPITAL_BASED_OUTPATIENT_CLINIC_OR_DEPARTMENT_OTHER): Payer: 59 | Admitting: Certified Registered"

## 2014-05-13 ENCOUNTER — Encounter (HOSPITAL_BASED_OUTPATIENT_CLINIC_OR_DEPARTMENT_OTHER)
Admission: RE | Disposition: A | Discharge status: Home / Self Care | Payer: Self-pay | Source: Ambulatory Visit | Attending: Surgery

## 2014-05-13 ENCOUNTER — Ambulatory Visit (HOSPITAL_COMMUNITY): Payer: 59

## 2014-05-13 ENCOUNTER — Ambulatory Visit (HOSPITAL_BASED_OUTPATIENT_CLINIC_OR_DEPARTMENT_OTHER)
Admission: RE | Admit: 2014-05-13 | Discharge: 2014-05-13 | Disposition: A | Discharge status: Home / Self Care | Payer: 59 | Source: Ambulatory Visit | Attending: Surgery | Admitting: Surgery

## 2014-05-13 ENCOUNTER — Encounter (HOSPITAL_BASED_OUTPATIENT_CLINIC_OR_DEPARTMENT_OTHER): Payer: Self-pay | Admitting: Certified Registered"

## 2014-05-13 DIAGNOSIS — E039 Hypothyroidism, unspecified: Secondary | ICD-10-CM | POA: Insufficient documentation

## 2014-05-13 DIAGNOSIS — J449 Chronic obstructive pulmonary disease, unspecified: Secondary | ICD-10-CM | POA: Insufficient documentation

## 2014-05-13 DIAGNOSIS — K811 Chronic cholecystitis: Secondary | ICD-10-CM | POA: Diagnosis not present

## 2014-05-13 DIAGNOSIS — Z87891 Personal history of nicotine dependence: Secondary | ICD-10-CM | POA: Insufficient documentation

## 2014-05-13 DIAGNOSIS — K859 Acute pancreatitis, unspecified: Secondary | ICD-10-CM | POA: Diagnosis not present

## 2014-05-13 DIAGNOSIS — K219 Gastro-esophageal reflux disease without esophagitis: Secondary | ICD-10-CM | POA: Insufficient documentation

## 2014-05-13 DIAGNOSIS — E0500 Thyrotoxicosis with diffuse goiter without thyrotoxic crisis or storm: Secondary | ICD-10-CM | POA: Diagnosis not present

## 2014-05-13 HISTORY — DX: Nasal congestion: R09.81

## 2014-05-13 HISTORY — DX: Stress incontinence (female) (male): N39.3

## 2014-05-13 HISTORY — PX: CHOLECYSTECTOMY: SHX55

## 2014-05-13 HISTORY — DX: Personal history of other diseases of the digestive system: Z87.19

## 2014-05-13 HISTORY — DX: Calculus of gallbladder without cholecystitis without obstruction: K80.20

## 2014-05-13 HISTORY — DX: Complete loss of teeth, unspecified cause, unspecified class: K08.109

## 2014-05-13 HISTORY — DX: Hypothyroidism, unspecified: E03.9

## 2014-05-13 HISTORY — DX: Complete loss of teeth, unspecified cause, unspecified class: Z97.2

## 2014-05-13 LAB — POCT HEMOGLOBIN-HEMACUE: HEMOGLOBIN: 11.6 g/dL — AB (ref 12.0–15.0)

## 2014-05-13 SURGERY — LAPAROSCOPIC CHOLECYSTECTOMY WITH INTRAOPERATIVE CHOLANGIOGRAM
Anesthesia: General | Site: Abdomen

## 2014-05-13 MED ORDER — FENTANYL CITRATE 0.05 MG/ML IJ SOLN
INTRAMUSCULAR | Status: AC
  Filled 2014-05-13: qty 4

## 2014-05-13 MED ORDER — PROPOFOL 10 MG/ML IV BOLUS
INTRAVENOUS | Status: DC | PRN
  Administered 2014-05-13 (×3): 30 mg via INTRAVENOUS
  Administered 2014-05-13: 150 mg via INTRAVENOUS

## 2014-05-13 MED ORDER — BUPIVACAINE-EPINEPHRINE (PF) 0.5% -1:200000 IJ SOLN
INTRAMUSCULAR | Status: AC
  Filled 2014-05-13: qty 30

## 2014-05-13 MED ORDER — MIDAZOLAM HCL 2 MG/2ML IJ SOLN
INTRAMUSCULAR | Status: AC
Start: 2014-05-13 — End: 2014-05-13
  Filled 2014-05-13: qty 2

## 2014-05-13 MED ORDER — SODIUM CHLORIDE 0.9 % IV SOLN: 250.0000 mL | INTRAVENOUS | Status: DC | PRN

## 2014-05-13 MED ORDER — FENTANYL CITRATE 0.05 MG/ML IJ SOLN
INTRAMUSCULAR | Status: DC | PRN
  Administered 2014-05-13 (×2): 50 ug via INTRAVENOUS
  Administered 2014-05-13: 100 ug via INTRAVENOUS

## 2014-05-13 MED ORDER — MIDAZOLAM HCL 2 MG/2ML IJ SOLN: 1.0000 mg | INTRAMUSCULAR | Status: DC | PRN

## 2014-05-13 MED ORDER — LACTATED RINGERS IV SOLN
INTRAVENOUS | Status: DC
  Administered 2014-05-13 (×2): via INTRAVENOUS

## 2014-05-13 MED ORDER — GLYCOPYRROLATE 0.2 MG/ML IJ SOLN
INTRAMUSCULAR | Status: DC | PRN
  Administered 2014-05-13: .6 mg via INTRAVENOUS

## 2014-05-13 MED ORDER — SUCCINYLCHOLINE CHLORIDE 20 MG/ML IJ SOLN
INTRAMUSCULAR | Status: DC | PRN
  Administered 2014-05-13: 120 mg via INTRAVENOUS

## 2014-05-13 MED ORDER — SODIUM CHLORIDE 0.9 % IV SOLN
INTRAVENOUS | Status: DC | PRN
  Administered 2014-05-13: 08:00:00

## 2014-05-13 MED ORDER — ROCURONIUM BROMIDE 100 MG/10ML IV SOLN
INTRAVENOUS | Status: DC | PRN
  Administered 2014-05-13: 10 mg via INTRAVENOUS

## 2014-05-13 MED ORDER — ONDANSETRON HCL 4 MG/2ML IJ SOLN: 4.0000 mg | Freq: Once | INTRAMUSCULAR | Status: DC | PRN

## 2014-05-13 MED ORDER — HYDROMORPHONE HCL 1 MG/ML IJ SOLN: 0.2500 mg | INTRAMUSCULAR | Status: DC | PRN

## 2014-05-13 MED ORDER — DEXAMETHASONE SODIUM PHOSPHATE 4 MG/ML IJ SOLN
INTRAMUSCULAR | Status: DC | PRN
  Administered 2014-05-13: 10 mg via INTRAVENOUS

## 2014-05-13 MED ORDER — SODIUM CHLORIDE 0.9 % IJ SOLN: 3.0000 mL | Freq: Two times a day (BID) | INTRAMUSCULAR | Status: DC

## 2014-05-13 MED ORDER — ACETAMINOPHEN 650 MG RE SUPP: 650.0000 mg | RECTAL | Status: DC | PRN

## 2014-05-13 MED ORDER — MEPERIDINE HCL 25 MG/ML IJ SOLN: 6.2500 mg | INTRAMUSCULAR | Status: DC | PRN

## 2014-05-13 MED ORDER — KETOROLAC TROMETHAMINE 30 MG/ML IJ SOLN
INTRAMUSCULAR | Status: DC | PRN
  Administered 2014-05-13: 30 mg via INTRAVENOUS

## 2014-05-13 MED ORDER — IBUPROFEN 800 MG PO TABS: 800.0000 mg | ORAL_TABLET | Freq: Three times a day (TID) | ORAL | Status: DC | PRN

## 2014-05-13 MED ORDER — MIDAZOLAM HCL 2 MG/ML PO SYRP: 12.0000 mg | ORAL_SOLUTION | Freq: Once | ORAL | Status: DC | PRN

## 2014-05-13 MED ORDER — SUCCINYLCHOLINE CHLORIDE 20 MG/ML IJ SOLN
INTRAMUSCULAR | Status: AC
  Filled 2014-05-13: qty 1

## 2014-05-13 MED ORDER — ARTIFICIAL TEARS OP OINT
TOPICAL_OINTMENT | OPHTHALMIC | Status: DC | PRN
  Administered 2014-05-13: 1 via OPHTHALMIC

## 2014-05-13 MED ORDER — MIDAZOLAM HCL 5 MG/5ML IJ SOLN
INTRAMUSCULAR | Status: DC | PRN
  Administered 2014-05-13: 2 mg via INTRAVENOUS

## 2014-05-13 MED ORDER — SODIUM CHLORIDE 0.9 % IR SOLN
Status: DC | PRN
  Administered 2014-05-13: 1

## 2014-05-13 MED ORDER — HYDROCODONE-ACETAMINOPHEN 5-325 MG PO TABS: 1.0000 | ORAL_TABLET | ORAL | Status: DC | PRN

## 2014-05-13 MED ORDER — OXYCODONE HCL 5 MG/5ML PO SOLN: 5.0000 mg | Freq: Once | ORAL | Status: DC | PRN

## 2014-05-13 MED ORDER — OXYCODONE HCL 5 MG PO TABS: 5.0000 mg | ORAL_TABLET | Freq: Once | ORAL | Status: DC | PRN

## 2014-05-13 MED ORDER — SODIUM CHLORIDE 0.9 % IJ SOLN: 3.0000 mL | INTRAMUSCULAR | Status: DC | PRN

## 2014-05-13 MED ORDER — LIDOCAINE HCL (CARDIAC) 20 MG/ML IV SOLN
INTRAVENOUS | Status: DC | PRN
  Administered 2014-05-13: 100 mg via INTRAVENOUS

## 2014-05-13 MED ORDER — OXYCODONE HCL 5 MG PO TABS: 5.0000 mg | ORAL_TABLET | ORAL | Status: DC | PRN

## 2014-05-13 MED ORDER — BUPIVACAINE-EPINEPHRINE (PF) 0.5% -1:200000 IJ SOLN
INTRAMUSCULAR | Status: DC | PRN
  Administered 2014-05-13: 20 mL

## 2014-05-13 MED ORDER — MORPHINE SULFATE 2 MG/ML IJ SOLN: 1.0000 mg | INTRAMUSCULAR | Status: DC | PRN

## 2014-05-13 MED ORDER — CEFAZOLIN SODIUM-DEXTROSE 2-3 GM-% IV SOLR
INTRAVENOUS | Status: AC
  Filled 2014-05-13: qty 50

## 2014-05-13 MED ORDER — NEOSTIGMINE METHYLSULFATE 10 MG/10ML IV SOLN
INTRAVENOUS | Status: DC | PRN
  Administered 2014-05-13: 3 mg via INTRAVENOUS

## 2014-05-13 MED ORDER — LIDOCAINE HCL 4 % MT SOLN
OROMUCOSAL | Status: DC | PRN
  Administered 2014-05-13: 2 mL via TOPICAL

## 2014-05-13 MED ORDER — ACETAMINOPHEN 325 MG PO TABS: 650.0000 mg | ORAL_TABLET | ORAL | Status: DC | PRN

## 2014-05-13 MED ORDER — CEFAZOLIN SODIUM-DEXTROSE 2-3 GM-% IV SOLR
2.0000 g | INTRAVENOUS | Status: AC
  Administered 2014-05-13: 2 g via INTRAVENOUS

## 2014-05-13 MED ORDER — FENTANYL CITRATE 0.05 MG/ML IJ SOLN: 50.0000 ug | INTRAMUSCULAR | Status: DC | PRN

## 2014-05-13 MED ORDER — PROPOFOL 10 MG/ML IV BOLUS
INTRAVENOUS | Status: AC
  Filled 2014-05-13: qty 20

## 2014-05-13 MED ORDER — ONDANSETRON HCL 4 MG/2ML IJ SOLN
INTRAMUSCULAR | Status: DC | PRN
  Administered 2014-05-13: 4 mg via INTRAVENOUS

## 2014-05-13 SURGICAL SUPPLY — 46 items
APL SKNCLS STERI-STRIP NONHPOA (GAUZE/BANDAGES/DRESSINGS) ×1
APPLIER CLIP 5 13 M/L LIGAMAX5 (MISCELLANEOUS) ×2
APR CLP MED LRG 5 ANG JAW (MISCELLANEOUS) ×1
BAG SPEC RTRVL LRG 6X4 10 (ENDOMECHANICALS) ×1
BANDAGE ADH SHEER 1  50/CT (GAUZE/BANDAGES/DRESSINGS) ×8 IMPLANT
BENZOIN TINCTURE PRP APPL 2/3 (GAUZE/BANDAGES/DRESSINGS) ×2 IMPLANT
BLADE CLIPPER SURG (BLADE) IMPLANT
CANISTER SUCT 3000ML (MISCELLANEOUS) ×2 IMPLANT
CHLORAPREP W/TINT 26ML (MISCELLANEOUS) ×2 IMPLANT
CLIP APPLIE 5 13 M/L LIGAMAX5 (MISCELLANEOUS) ×1 IMPLANT
COVER MAYO STAND STRL (DRAPES) ×1 IMPLANT
DECANTER SPIKE VIAL GLASS SM (MISCELLANEOUS) ×2 IMPLANT
DRAPE C-ARM 42X72 X-RAY (DRAPES) ×1 IMPLANT
DRAPE UTILITY XL STRL (DRAPES) ×2 IMPLANT
ELECT REM PT RETURN 9FT ADLT (ELECTROSURGICAL) ×2
ELECTRODE REM PT RTRN 9FT ADLT (ELECTROSURGICAL) ×1 IMPLANT
FILTER SMOKE EVAC LAPAROSHD (FILTER) ×2 IMPLANT
GLOVE BIO SURGEON STRL SZ7 (GLOVE) ×1 IMPLANT
GLOVE BIOGEL PI IND STRL 7.0 (GLOVE) IMPLANT
GLOVE BIOGEL PI IND STRL 7.5 (GLOVE) IMPLANT
GLOVE BIOGEL PI INDICATOR 7.0 (GLOVE) ×1
GLOVE BIOGEL PI INDICATOR 7.5 (GLOVE) ×1
GLOVE ECLIPSE 6.5 STRL STRAW (GLOVE) ×1 IMPLANT
GLOVE EXAM NITRILE MD LF STRL (GLOVE) ×1 IMPLANT
GLOVE SURG SIGNA 7.5 PF LTX (GLOVE) ×2 IMPLANT
GOWN STRL REUS W/ TWL LRG LVL3 (GOWN DISPOSABLE) ×1 IMPLANT
GOWN STRL REUS W/TWL LRG LVL3 (GOWN DISPOSABLE) ×2
HEMOSTAT SNOW SURGICEL 2X4 (HEMOSTASIS) ×2 IMPLANT
NS IRRIG 1000ML POUR BTL (IV SOLUTION) ×1 IMPLANT
PACK BASIN DAY SURGERY FS (CUSTOM PROCEDURE TRAY) ×2 IMPLANT
POUCH SPECIMEN RETRIEVAL 10MM (ENDOMECHANICALS) ×2 IMPLANT
SCISSORS LAP 5X35 DISP (ENDOMECHANICALS) IMPLANT
SET CHOLANGIOGRAPH 5 50 .035 (SET/KITS/TRAYS/PACK) ×1 IMPLANT
SET IRRIG TUBING LAPAROSCOPIC (IRRIGATION / IRRIGATOR) ×2 IMPLANT
SLEEVE ENDOPATH XCEL 5M (ENDOMECHANICALS) ×4 IMPLANT
SLEEVE SCD COMPRESS KNEE MED (MISCELLANEOUS) ×2 IMPLANT
SPECIMEN JAR SMALL (MISCELLANEOUS) ×2 IMPLANT
SUT MON AB 4-0 PC3 18 (SUTURE) ×2 IMPLANT
SUT VICRYL 0 UR6 27IN ABS (SUTURE) IMPLANT
TOWEL OR 17X24 6PK STRL BLUE (TOWEL DISPOSABLE) ×2 IMPLANT
TOWEL OR NON WOVEN STRL DISP B (DISPOSABLE) ×2 IMPLANT
TRAY LAPAROSCOPIC (CUSTOM PROCEDURE TRAY) ×2 IMPLANT
TROCAR XCEL BLUNT TIP 100MML (ENDOMECHANICALS) ×2 IMPLANT
TROCAR XCEL NON-BLD 5MMX100MML (ENDOMECHANICALS) ×2 IMPLANT
TUBE CONNECTING 20X1/4 (TUBING) ×2 IMPLANT
TUBING INSUFFLATION (TUBING) ×2 IMPLANT

## 2014-05-13 NOTE — Op Note (Signed)
Laparoscopic Cholecystectomy with IOC Procedure Note  Indications: This patient presents with symptomatic gallbladder disease and will undergo laparoscopic cholecystectomy.  Pre-operative Diagnosis: Calculus of gallbladder without mention of cholecystitis or obstruction  Post-operative Diagnosis: Same  Surgeon: Abigail MiyamotoBLACKMAN,Kaito Schulenburg A   Assistants: 0  Anesthesia: General endotracheal anesthesia  ASA Class: 2  Procedure Details  The patient was seen again in the Holding Room. The risks, benefits, complications, treatment options, and expected outcomes were discussed with the patient. The possibilities of reaction to medication, pulmonary aspiration, perforation of viscus, bleeding, recurrent infection, finding a normal gallbladder, the need for additional procedures, failure to diagnose a condition, the possible need to convert to an open procedure, and creating a complication requiring transfusion or operation were discussed with the patient. The likelihood of improving the patient's symptoms with return to their baseline status is good.  The patient and/or family concurred with the proposed plan, giving informed consent. The site of surgery properly noted. The patient was taken to Operating Room, identified as Dana Martin and the procedure verified as Laparoscopic Cholecystectomy with Intraoperative Cholangiogram. A Time Out was held and the above information confirmed.  Prior to the induction of general anesthesia, antibiotic prophylaxis was administered. General endotracheal anesthesia was then administered and tolerated well. After the induction, the abdomen was prepped with Chloraprep and draped in the sterile fashion. The patient was positioned in the supine position.  Local anesthetic agent was injected into the skin near the umbilicus and an incision made. We dissected down to the abdominal fascia with blunt dissection.  The fascia was incised vertically and we entered the peritoneal  cavity bluntly.  A pursestring suture of 0-Vicryl was placed around the fascial opening.  The Hasson cannula was inserted and secured with the stay suture.  Pneumoperitoneum was then created with CO2 and tolerated well without any adverse changes in the patient's vital signs. An 11-mm port was placed in the subxiphoid position.  Two 5-mm ports were placed in the right upper quadrant. All skin incisions were infiltrated with a local anesthetic agent before making the incision and placing the trocars.   We positioned the patient in reverse Trendelenburg, tilted slightly to the patient's left.  The gallbladder was identified, the fundus grasped and retracted cephalad. Adhesions were lysed bluntly and with the electrocautery where indicated, taking care not to injure any adjacent organs or viscus. The infundibulum was grasped and retracted laterally, exposing the peritoneum overlying the triangle of Calot. This was then divided and exposed in a blunt fashion. A critical view of the cystic duct and cystic artery was obtained.  The cystic duct was clearly identified and bluntly dissected circumferentially. The cystic duct was ligated with a clip distally.   An incision was made in the cystic duct and the [facility] cholangiogram catheter introduced. The catheter was secured using a clip. A cholangiogram was then obtained which showed good visualization of the distal and proximal biliary tree with no sign of filling defects or obstruction.  Contrast flowed easily into the duodenum. The catheter was then removed.   The cystic duct was then ligated with clips and divided. The cystic artery was identified, dissected free, ligated with clips and divided as well.   The gallbladder was dissected from the liver bed in retrograde fashion with the electrocautery. The gallbladder was removed and placed in an Endocatch sac. The liver bed was irrigated and inspected. Hemostasis was achieved with the electrocautery. Copious irrigation  was utilized and was repeatedly aspirated until clear.  The gallbladder and Endocatch sac were then removed through the umbilical port site.  The pursestring suture was used to close the umbilical fascia.    We again inspected the right upper quadrant for hemostasis.  Pneumoperitoneum was released as we removed the trocars.  4-0 Monocryl was used to close the skin.   Benzoin, steri-strips, and clean dressings were applied. The patient was then extubated and brought to the recovery room in stable condition. Instrument, sponge, and needle counts were correct at closure and at the conclusion of the case.   Findings: Cholecystitis with Cholelithiasis  Estimated Blood Loss: Minimal         Drains: 0         Specimens: Gallbladder           Complications: None; patient tolerated the procedure well.         Disposition: PACU - hemodynamically stable.         Condition: stable

## 2014-05-13 NOTE — Anesthesia Postprocedure Evaluation (Signed)
Anesthesia Post Note  Patient: Dana Martin  Procedure(s) Performed: Procedure(s) (LRB): LAPAROSCOPIC CHOLECYSTECTOMY WITH INTRAOPERATIVE CHOLANGIOGRAM (N/A)  Anesthesia type: general  Patient location: PACU  Post pain: Pain level controlled  Post assessment: Patient's Cardiovascular Status Stable  Last Vitals:  Filed Vitals:   05/13/14 1000  BP: 135/90  Pulse: 77  Temp:   Resp: 20    Post vital signs: Reviewed and stable  Level of consciousness: sedated  Complications: No apparent anesthesia complications

## 2014-05-13 NOTE — Discharge Instructions (Signed)
[facility] ______[facility] LAPAROSCOPIC SURGERY: POST OP INSTRUCTIONS Always review your discharge instruction sheet given to you by the facility where your surgery was performed. IF YOU HAVE DISABILITY OR FAMILY LEAVE FORMS, YOU MUST BRING THEM TO THE OFFICE FOR PROCESSING.   DO NOT GIVE THEM TO YOUR DOCTOR.  1. A prescription for pain medication may be given to you upon discharge.  Take your pain medication as prescribed, if needed.  If narcotic pain medicine is not needed, then you may take acetaminophen (Tylenol) or ibuprofen (Advil) as needed. 2. Take your usually prescribed medications unless otherwise directed. 3. If you need a refill on your pain medication, please contact your pharmacy.  They will contact our office to request authorization. Prescriptions will not be filled after 5pm or on week-ends. 4. You should follow a light diet the first few days after arrival home, such as soup and crackers, etc.  Be sure to include lots of fluids daily. 5. Most patients will experience some swelling and bruising in the area of the incisions.  Ice packs will help.  Swelling and bruising can take several days to resolve.  6. It is common to experience some constipation if taking pain medication after surgery.  Increasing fluid intake and taking a stool softener (such as Colace) will usually help or prevent this problem from occurring.  A mild laxative (Milk of Magnesia or Miralax) should be taken according to package instructions if there are no bowel movements after 48 hours. 7. Unless discharge instructions indicate otherwise, you may remove your bandages 24-48 hours after surgery, and you may shower at that time.  You may have steri-strips (small skin tapes) in place directly over the incision.  These strips should be left on the skin for 7-10 days.  If your surgeon used skin glue on the incision, you may shower in 24 hours.  The glue will flake off over the next 2-3 weeks.  Any sutures or  staples will be removed at the office during your follow-up visit. 8. ACTIVITIES:  You may resume regular (light) daily activities beginning the next day--such as daily self-care, walking, climbing stairs--gradually increasing activities as tolerated.  You may have sexual intercourse when it is comfortable.  Refrain from any heavy lifting or straining until approved by your doctor. a. You may drive when you are no longer taking prescription pain medication, you can comfortably wear a seatbelt, and you can safely maneuver your car and apply brakes. b. RETURN TO WORK:  __________________________________________________________ 9. You should see your doctor in the office for a follow-up appointment approximately 2-3 weeks after your surgery.  Make sure that you call for this appointment within a day or two after you arrive home to insure a convenient appointment time. 10. OTHER INSTRUCTIONS: __NO LIFTING MORE THAN 15 POUNDS FOR 2 WEEKS. 11. ________________________________________________________________________________________________________________________ __________________________________________________________________________________________________________________________ WHEN TO CALL YOUR DOCTOR: 1. Fever over 101.0 2. Inability to urinate 3. Continued bleeding from incision. 4. Increased pain, redness, or drainage from the incision. 5. Increasing abdominal pain  The clinic staff is available to answer your questions during regular business hours.  Please dont hesitate to call and ask to speak to one of the nurses for clinical concerns.  If you have a medical emergency, go to the nearest emergency room or call 911.  A surgeon from [facility] is always on call at the hospital. [address], [address], [place], [place]  [id number] ? [address] [address], [place], [place]   [phone] ? [phone] ? FAX [phone] Web site: [email or web address]   Post  Anesthesia Home Care Instructions  Activity: Get plenty of rest for the remainder of the day. A responsible adult should stay with you for 24 hours following the procedure.  For the next 24 hours, DO NOT: -Drive a car -Paediatric nurse -Drink alcoholic beverages -Take any medication unless instructed by your physician -Make any legal decisions or sign important papers.  Meals: Start with liquid foods such as gelatin or soup. Progress to regular foods as tolerated. Avoid greasy, spicy, heavy foods. If nausea and/or vomiting occur, drink only clear liquids until the nausea and/or vomiting subsides. Call your physician if vomiting continues.  Special Instructions/Symptoms: Your throat may feel dry or sore from the anesthesia or the breathing tube placed in your throat during surgery. If this causes discomfort, gargle with warm salt water. The discomfort should disappear within 24 hours.

## 2014-05-13 NOTE — Transfer of Care (Signed)
Immediate Anesthesia Transfer of Care Note  Patient: Dana ChurchMerri L Martin  Procedure(s) Performed: Procedure(s): LAPAROSCOPIC CHOLECYSTECTOMY WITH INTRAOPERATIVE CHOLANGIOGRAM (N/A)  Patient Location: PACU  Anesthesia Type:General  Level of Consciousness: awake, sedated and responds to stimulation  Airway & Oxygen Therapy: Patient Spontanous Breathing and Patient connected to face mask oxygen  Post-op Assessment: Report given to PACU RN, Post -op Vital signs reviewed and stable and Patient moving all extremities  Post vital signs: Reviewed and stable  Complications: No apparent anesthesia complications

## 2014-05-13 NOTE — Anesthesia Procedure Notes (Signed)
Procedure Name: Intubation Date/Time: 05/13/2014 7:34 AM Performed by: Baxter Flattery Pre-anesthesia Checklist: Patient identified, Emergency Drugs available, Suction available and Patient being monitored Patient Re-evaluated:Patient Re-evaluated prior to inductionOxygen Delivery Method: Circle System Utilized Preoxygenation: Pre-oxygenation with 100% oxygen Intubation Type: IV induction Ventilation: Mask ventilation without difficulty Laryngoscope Size: 2 and Miller Grade View: Grade II Tube type: Oral Tube size: 7.0 mm Number of attempts: 1 Airway Equipment and Method: stylet,  oral airway and LTA kit utilized Placement Confirmation: ETT inserted through vocal cords under direct vision,  positive ETCO2 and breath sounds checked- equal and bilateral Secured at: 22 cm Tube secured with: Tape Dental Injury: Teeth and Oropharynx as per pre-operative assessment

## 2014-05-13 NOTE — Anesthesia Preprocedure Evaluation (Signed)
Anesthesia Evaluation  Patient identified by MRN, date of birth, ID band Patient awake    Reviewed: Allergy & Precautions, H&P , NPO status , Patient's Chart, lab work & pertinent test results  Airway Mallampati: I TM Distance: >3 FB Neck ROM: Full    Dental   Pulmonary former smoker,          Cardiovascular     Neuro/Psych    GI/Hepatic GERD-  Medicated and Controlled,  Endo/Other  Hypothyroidism   Renal/GU      Musculoskeletal   Abdominal   Peds  Hematology   Anesthesia Other Findings   Reproductive/Obstetrics                           Anesthesia Physical Anesthesia Plan  ASA: II  Anesthesia Plan: General   Post-op Pain Management:    Induction: Intravenous  Airway Management Planned: Oral ETT  Additional Equipment:   Intra-op Plan:   Post-operative Plan: Extubation in OR  Informed Consent: I have reviewed the patients History and Physical, chart, labs and discussed the procedure including the risks, benefits and alternatives for the proposed anesthesia with the patient or authorized representative who has indicated his/her understanding and acceptance.     Plan Discussed with: CRNA and Surgeon  Anesthesia Plan Comments:         Anesthesia Quick Evaluation

## 2014-05-13 NOTE — Interval H&P Note (Signed)
History and Physical Interval Note: no change in H and P  05/13/2014 7:02 AM  Dana Martin  has presented today for surgery, with the diagnosis of symptomatic cholelithiasis  The various methods of treatment have been discussed with the patient and family. After consideration of risks, benefits and other options for treatment, the patient has consented to  Procedure(s): LAPAROSCOPIC CHOLECYSTECTOMY WITH INTRAOPERATIVE CHOLANGIOGRAM (N/A) as a surgical intervention .  The patient's history has been reviewed, patient examined, no change in status, stable for surgery.  I have reviewed the patient's chart and labs.  Questions were answered to the patient's satisfaction.     Tesha Archambeau A

## 2014-05-14 ENCOUNTER — Encounter (HOSPITAL_BASED_OUTPATIENT_CLINIC_OR_DEPARTMENT_OTHER): Payer: Self-pay | Admitting: Surgery

## 2014-08-05 ENCOUNTER — Ambulatory Visit: Payer: 59 | Attending: Internal Medicine | Admitting: Internal Medicine

## 2014-08-05 ENCOUNTER — Encounter: Payer: Self-pay | Admitting: Internal Medicine

## 2014-08-05 VITALS — BP 131/95 | HR 90 | Temp 98.0°F | Resp 16 | Wt 196.8 lb

## 2014-08-05 DIAGNOSIS — E039 Hypothyroidism, unspecified: Secondary | ICD-10-CM

## 2014-08-05 DIAGNOSIS — Z23 Encounter for immunization: Secondary | ICD-10-CM | POA: Diagnosis not present

## 2014-08-05 DIAGNOSIS — Z79899 Other long term (current) drug therapy: Secondary | ICD-10-CM | POA: Diagnosis not present

## 2014-08-05 DIAGNOSIS — N393 Stress incontinence (female) (male): Secondary | ICD-10-CM | POA: Diagnosis not present

## 2014-08-05 DIAGNOSIS — Z87891 Personal history of nicotine dependence: Secondary | ICD-10-CM | POA: Insufficient documentation

## 2014-08-05 DIAGNOSIS — J449 Chronic obstructive pulmonary disease, unspecified: Secondary | ICD-10-CM | POA: Insufficient documentation

## 2014-08-05 DIAGNOSIS — Z139 Encounter for screening, unspecified: Secondary | ICD-10-CM

## 2014-08-05 DIAGNOSIS — K219 Gastro-esophageal reflux disease without esophagitis: Secondary | ICD-10-CM | POA: Diagnosis not present

## 2014-08-05 DIAGNOSIS — Z1211 Encounter for screening for malignant neoplasm of colon: Secondary | ICD-10-CM

## 2014-08-05 MED ORDER — LEVOTHYROXINE SODIUM 175 MCG PO TABS: 175.0000 ug | ORAL_TABLET | Freq: Every day | ORAL | Status: DC

## 2014-08-05 NOTE — Progress Notes (Signed)
Patient here for follow up on her thyroid disease and for routine blood work

## 2014-08-05 NOTE — Progress Notes (Signed)
MRN: [id number] Name: Dana Martin  Sex: female Age: 48 y.o. DOB: [birth year]  Allergies: Fish allergy; Omnipaque; Other; Peanut-containing drug products; and Shellfish allergy  Chief Complaint  Patient presents with  . Follow-up    HPI: Patient is 48 y.o. female who history of hypothyroidism comes today for followup as per patient she has missed the dose of her levothyroxine medication this month currently she is on 175 mcg daily, she denies any headache dizziness chest and shortness of breath, she is due for flu shot, mammogram and colonoscopy.  Past Medical History  Diagnosis Date  . GERD (gastroesophageal reflux disease)   . Hypothyroidism   . COPD (chronic obstructive pulmonary disease)     denies SOB with daily activities  . History of pancreatitis   . Symptomatic cholelithiasis 05/2014  . Full dentures   . Nasal sinus congestion 05/08/2014    with sinus drainage  . Stress incontinence     Past Surgical History  Procedure Laterality Date  . Tubal ligation    . Eus N/A 02/19/2014    Procedure: ESOPHAGEAL ENDOSCOPIC ULTRASOUND (EUS) RADIAL;  Surgeon: Willis Modena, MD;  Location: [facility];  Service: Endoscopy;  Laterality: N/A;  . Vaginal hysterectomy  11/13/2001    transvaginal   . Breast lumpectomy    . Cholecystectomy N/A 05/13/2014    Procedure: LAPAROSCOPIC CHOLECYSTECTOMY WITH INTRAOPERATIVE CHOLANGIOGRAM;  Surgeon: Abigail Miyamoto, MD;  Location: [facility];  Service: General;  Laterality: N/A;      Medication List       This list is accurate as of: 08/05/14  9:45 AM.  Always use your most recent med list.               HYDROcodone-acetaminophen 5-325 MG per tablet  Commonly known as:  NORCO  Take 1-2 tablets by mouth every 4 (four) hours as needed.     ibuprofen 800 MG tablet  Commonly known as:  ADVIL,MOTRIN  Take 1 tablet (800 mg total) by mouth every 8 (eight) hours as needed.     lansoprazole 15 MG capsule  Commonly  known as:  PREVACID  Take 15 mg by mouth daily at 12 noon.     levocetirizine 5 MG tablet  Commonly known as:  XYZAL  Take 1 tablet (5 mg total) by mouth every evening.     levothyroxine 175 MCG tablet  Commonly known as:  SYNTHROID, LEVOTHROID  Take 1 tablet (175 mcg total) by mouth daily before breakfast.        Meds ordered this encounter  Medications  . levothyroxine (SYNTHROID, LEVOTHROID) 175 MCG tablet    Sig: Take 1 tablet (175 mcg total) by mouth daily before breakfast.    Dispense:  30 tablet    Refill:  3     There is no immunization history on file for this patient.  History reviewed. No pertinent family history.  History  Substance Use Topics  . Smoking status: Former Smoker    Quit date: 05/08/2013  . Smokeless tobacco: Never Used  . Alcohol Use: Yes     Comment: rarely    Review of Systems   As noted in HPI  Filed Vitals:   08/05/14 0923  BP: 131/95  Pulse: 90  Temp: 98 F (36.7 C)  Resp: 16    Physical Exam  Physical Exam  Constitutional: No distress.  Eyes: EOM are normal. Pupils are equal, round, and reactive to light.  Cardiovascular: Normal rate and regular rhythm.  Pulmonary/Chest: Breath sounds normal. No respiratory distress. She has no wheezes. She has no rales.  Musculoskeletal: She exhibits no edema.    CBC    Component Value Date/Time   WBC 12.4* 12/09/2013 0420   RBC 4.12 12/09/2013 0420   HGB 11.6* 05/13/2014 0703   HCT 33.6* 12/09/2013 0420   PLT 214 12/09/2013 0420   MCV 81.6 12/09/2013 0420   LYMPHSABS 1.4 12/06/2013 1007   MONOABS 0.8 12/06/2013 1007   EOSABS 0.0 12/06/2013 1007   BASOSABS 0.0 12/06/2013 1007    CMP     Component Value Date/Time   NA 134* 12/06/2013 1007   K 4.1 12/06/2013 1007   CL 96 12/06/2013 1007   CO2 22 12/06/2013 1007   GLUCOSE 83 12/06/2013 1007   BUN 12 12/06/2013 1007   CREATININE 0.46* 12/06/2013 1940   CALCIUM 9.4 12/06/2013 1007   PROT 8.3 12/06/2013 1007   ALBUMIN  3.4* 12/06/2013 1007   AST 12 12/06/2013 1007   ALT 9 12/06/2013 1007   ALKPHOS 82 12/06/2013 1007   BILITOT 0.4 12/06/2013 1007   GFRNONAA >90 12/06/2013 1940   GFRAA >90 12/06/2013 1940    Lab Results  Component Value Date/Time   CHOL 197 12/07/2013 04:54 AM    No components found for: HGA1C  Lab Results  Component Value Date/Time   AST 12 12/06/2013 10:07 AM    Assessment and Plan  Hypothyroidism, unspecified hypothyroidism type - Plan: levothyroxine (SYNTHROID, LEVOTHROID) 175 MCG tablet , will do blood work on next visit since patient has missed the dose of medication this month  Screening - Plan: MM DIGITAL SCREENING BILATERAL  Needs flu shot Flu shot given today   Special screening for malignant neoplasms, colon - Plan: Ambulatory referral to Gastroenterology   Health Maintenance -Colonoscopy: referred to GI   -Mammogram: ordered  -Vaccinations:  Flu shot today   Return in about 3 months (around 11/04/2014) for hypothyroid.  Doris Cheadle, MD

## 2014-12-23 ENCOUNTER — Other Ambulatory Visit: Payer: Self-pay | Admitting: Internal Medicine

## 2015-02-01 ENCOUNTER — Other Ambulatory Visit: Payer: Self-pay | Admitting: Internal Medicine

## 2015-02-18 ENCOUNTER — Telehealth: Payer: Self-pay | Admitting: General Practice

## 2015-02-18 NOTE — Telephone Encounter (Signed)
Patient calling to request medication refill for:   SYNTHROID 175 MCG tablet      Explained to patient that since she has not been in since December 2015, that an appointment may be needed prior to med refill.  Patient states she does not have any insurance and is unable to come until she receives insurance.  (Informed patient that she can still be seen here with no insurance)... Patient declined. Please assist

## 2015-02-19 ENCOUNTER — Telehealth: Payer: Self-pay

## 2015-02-19 MED ORDER — LEVOTHYROXINE SODIUM 175 MCG PO TABS: ORAL_TABLET | ORAL | Status: DC

## 2015-02-19 NOTE — Telephone Encounter (Signed)
Pt calling to follow up on refill request. Please f/u with pt.  °

## 2015-02-19 NOTE — Telephone Encounter (Signed)
Patient called requesting a one month supply on her thyroid medication Patient has not been seen since dec. Patient states she has no insurance and can not afford labs Instructed patient i will send a one month supply but no refills until she Sees her primary doctor

## 2015-03-12 ENCOUNTER — Ambulatory Visit: Payer: BLUE CROSS/BLUE SHIELD | Attending: Internal Medicine | Admitting: Internal Medicine

## 2015-03-12 ENCOUNTER — Encounter: Payer: Self-pay | Admitting: Internal Medicine

## 2015-03-12 VITALS — BP 120/90 | HR 90 | Temp 98.0°F | Resp 16 | Wt 205.0 lb

## 2015-03-12 DIAGNOSIS — J449 Chronic obstructive pulmonary disease, unspecified: Secondary | ICD-10-CM | POA: Insufficient documentation

## 2015-03-12 DIAGNOSIS — Z87891 Personal history of nicotine dependence: Secondary | ICD-10-CM | POA: Insufficient documentation

## 2015-03-12 DIAGNOSIS — R319 Hematuria, unspecified: Secondary | ICD-10-CM | POA: Diagnosis not present

## 2015-03-12 DIAGNOSIS — Z87442 Personal history of urinary calculi: Secondary | ICD-10-CM | POA: Insufficient documentation

## 2015-03-12 DIAGNOSIS — Z1211 Encounter for screening for malignant neoplasm of colon: Secondary | ICD-10-CM

## 2015-03-12 DIAGNOSIS — R1031 Right lower quadrant pain: Secondary | ICD-10-CM | POA: Diagnosis not present

## 2015-03-12 DIAGNOSIS — E039 Hypothyroidism, unspecified: Secondary | ICD-10-CM | POA: Diagnosis not present

## 2015-03-12 DIAGNOSIS — K219 Gastro-esophageal reflux disease without esophagitis: Secondary | ICD-10-CM | POA: Diagnosis not present

## 2015-03-12 DIAGNOSIS — Z79899 Other long term (current) drug therapy: Secondary | ICD-10-CM | POA: Insufficient documentation

## 2015-03-12 LAB — COMPLETE METABOLIC PANEL WITH GFR
ALT: 12 U/L (ref 6–29)
AST: 13 U/L (ref 10–35)
Albumin: 4 g/dL (ref 3.6–5.1)
Alkaline Phosphatase: 85 U/L (ref 33–130)
BUN: 7 mg/dL (ref 7–25)
CO2: 29 mmol/L (ref 20–31)
Calcium: 9 mg/dL (ref 8.6–10.4)
Chloride: 100 mmol/L (ref 98–110)
Creat: 0.6 mg/dL (ref 0.50–1.05)
Glucose, Bld: 83 mg/dL (ref 65–99)
Potassium: 4.6 mmol/L (ref 3.5–5.3)
SODIUM: 138 mmol/L (ref 135–146)
TOTAL PROTEIN: 7.4 g/dL (ref 6.1–8.1)
Total Bilirubin: 0.5 mg/dL (ref 0.2–1.2)

## 2015-03-12 LAB — CBC WITH DIFFERENTIAL/PLATELET
BASOS ABS: 0.1 10*3/uL (ref 0.0–0.1)
BASOS PCT: 1 % (ref 0–1)
EOS ABS: 0.1 10*3/uL (ref 0.0–0.7)
Eosinophils Relative: 1 % (ref 0–5)
HCT: 41 % (ref 36.0–46.0)
HEMOGLOBIN: 13.5 g/dL (ref 12.0–15.0)
LYMPHS ABS: 2.4 10*3/uL (ref 0.7–4.0)
LYMPHS PCT: 19 % (ref 12–46)
MCH: 27.1 pg (ref 26.0–34.0)
MCHC: 32.9 g/dL (ref 30.0–36.0)
MCV: 82.2 fL (ref 78.0–100.0)
MPV: 11.8 fL (ref 8.6–12.4)
Monocytes Absolute: 0.8 10*3/uL (ref 0.1–1.0)
Monocytes Relative: 6 % (ref 3–12)
Neutro Abs: 9.2 10*3/uL — ABNORMAL HIGH (ref 1.7–7.7)
Neutrophils Relative %: 73 % (ref 43–77)
PLATELETS: 173 10*3/uL (ref 150–400)
RBC: 4.99 MIL/uL (ref 3.87–5.11)
RDW: 16.1 % — AB (ref 11.5–15.5)
WBC: 12.6 10*3/uL — ABNORMAL HIGH (ref 4.0–10.5)

## 2015-03-12 LAB — POCT URINALYSIS DIPSTICK
Bilirubin, UA: NEGATIVE
Glucose, UA: NEGATIVE
Ketones, UA: NEGATIVE
LEUKOCYTES UA: NEGATIVE
Nitrite, UA: NEGATIVE
PROTEIN UA: NEGATIVE
SPEC GRAV UA: 1.01
Urobilinogen, UA: 0.2
pH, UA: 7

## 2015-03-12 LAB — TSH: TSH: 1.811 u[IU]/mL (ref 0.350–4.500)

## 2015-03-12 NOTE — Progress Notes (Signed)
MRN: [id number] Name: Dana Martin  Sex: female Age: 49 y.o. DOB: [birth year]  Allergies: Fish allergy; Omnipaque; Other; Peanut-containing drug products; and Shellfish allergy  Chief Complaint  Patient presents with  . Follow-up    HPI: Patient is 49 y.o. female who has history of hypothyroidism, GERD, comes today for followup , she's complaining of right flank/lower quadrant pain on and off, she denies any nausea vomiting change in bowel habits denies any fever chills denies any urinary symptoms, previous medical record reviewed she had abdominal CT scan done in may 2015, at that time reported to have ovarian cyst as well as nonobstructive right renal stone, patient has history of cholecystectomy as well as hysterectomy,.currently patient is taking PPI as well as her thyroid medication.  Past Medical History  Diagnosis Date  . GERD (gastroesophageal reflux disease)   . Hypothyroidism   . COPD (chronic obstructive pulmonary disease)     denies SOB with daily activities  . History of pancreatitis   . Symptomatic cholelithiasis 05/2014  . Full dentures   . Nasal sinus congestion 05/08/2014    with sinus drainage  . Stress incontinence     Past Surgical History  Procedure Laterality Date  . Tubal ligation    . Eus N/A 02/19/2014    Procedure: ESOPHAGEAL ENDOSCOPIC ULTRASOUND (EUS) RADIAL;  Surgeon: Willis Modena, MD;  Location: [facility];  Service: Endoscopy;  Laterality: N/A;  . Vaginal hysterectomy  11/13/2001    transvaginal   . Breast lumpectomy    . Cholecystectomy N/A 05/13/2014    Procedure: LAPAROSCOPIC CHOLECYSTECTOMY WITH INTRAOPERATIVE CHOLANGIOGRAM;  Surgeon: Abigail Miyamoto, MD;  Location: [facility];  Service: General;  Laterality: N/A;      Medication List       This list is accurate as of: 03/12/15 10:25 AM.  Always use your most recent med list.               HYDROcodone-acetaminophen 5-325 MG per tablet  Commonly known as:  NORCO    Take 1-2 tablets by mouth every 4 (four) hours as needed.     ibuprofen 800 MG tablet  Commonly known as:  ADVIL,MOTRIN  Take 1 tablet (800 mg total) by mouth every 8 (eight) hours as needed.     lansoprazole 15 MG capsule  Commonly known as:  PREVACID  Take 15 mg by mouth daily at 12 noon.     levocetirizine 5 MG tablet  Commonly known as:  XYZAL  Take 1 tablet (5 mg total) by mouth every evening.     levothyroxine 175 MCG tablet  Commonly known as:  SYNTHROID  TAKE 1 TABLET (175 MCG TOTAL) BY MOUTH DAILY BEFORE BREAKFAST.        No orders of the defined types were placed in this encounter.     There is no immunization history on file for this patient.  History reviewed. No pertinent family history.  History  Substance Use Topics  . Smoking status: Former Smoker    Quit date: 05/08/2013  . Smokeless tobacco: Never Used  . Alcohol Use: Yes     Comment: rarely    Review of Systems   As noted in HPI  Filed Vitals:   03/12/15 0944  BP: 120/90  Pulse: 90  Temp: 98 F (36.7 C)  Resp: 16    Physical Exam  Physical Exam  Constitutional: No distress.  Eyes: EOM are normal. Pupils are equal, round, and reactive to light.  Cardiovascular:  Normal rate and regular rhythm.   Pulmonary/Chest: Breath sounds normal. No respiratory distress. She has no wheezes. She has no rales.  Abdominal:  Minimal tenderness in the right flank with deep palpation, No CVA tenderness, no rebound or guarding bowel sounds positive.  Musculoskeletal: She exhibits no edema.    Labs   Lab Results  Component Value Date   WBC 12.4* 12/09/2013   HGB 11.6* 05/13/2014   HCT 33.6* 12/09/2013   PLT 214 12/09/2013   GLUCOSE 83 12/06/2013   CHOL 197 12/07/2013   TRIG 88 12/07/2013   HDL 42 12/07/2013   LDLCALC 137* 12/07/2013   ALT 9 12/06/2013   AST 12 12/06/2013   NA 134* 12/06/2013   K 4.1 12/06/2013   CL 96 12/06/2013   CREATININE 0.46* 12/06/2013   BUN 12 12/06/2013   CO2  22 12/06/2013   TSH 2.733 05/29/2013   HGBA1C 5.3 05/29/2013    Lab Results  Component Value Date   HGBA1C 5.3 05/29/2013     Assessment and Plan  Right lower quadrant abdominal pain - Plan:  Results for orders placed or performed in visit on 03/12/15  Urinalysis Dipstick  Result Value Ref Range   Color, UA yellow    Clarity, UA clear    Glucose, UA neg    Bilirubin, UA neg    Ketones, UA neg    Spec Grav, UA 1.010    Blood, UA moderate    pH, UA 7.0    Protein, UA neg    Urobilinogen, UA 0.2    Nitrite, UA neg    Leukocytes, UA Negative Negative   Urinalysis Dipstick is negative for infection, does have blood which is likely secondary to renal stone, will repeat her CT scan CBC with Differential/Platelet, COMPLETE METABOLIC PANEL WITH GFR  Hypothyroidism, unspecified hypothyroidism type - Plan: currently patient is taking levothyroxine 175 mcg daily, repeat TSH  Hematuria - Plan: CT Abdomen Pelvis Wo Contrast  Personal history of kidney stones - Plan: CT Abdomen Pelvis Wo Contrast  Special screening for malignant neoplasms, colon - Plan: Ambulatory referral to Gastroenterology  GERD Last modification, continue with Prevacid.  Health Maintenance -Colonoscopy:referred to GI   Return in about 3 months (around 06/12/2015), or if symptoms worsen or fail to improve.   This note has been created with Education officer, environmental. Any transcriptional errors are unintentional.    Doris Cheadle, MD

## 2015-03-12 NOTE — Progress Notes (Signed)
Patient here for follow up on her thyroid disease Patient also complaining of some abd pain to her right side

## 2015-03-13 ENCOUNTER — Telehealth: Payer: Self-pay

## 2015-03-13 NOTE — Telephone Encounter (Signed)
Patient not available Unable to leave message No voice mail associated with home number

## 2015-03-13 NOTE — Telephone Encounter (Signed)
-----   Message from Doris Cheadle, MD sent at 03/13/2015 10:36 AM EDT ----- Call and let the  patient know that her TSH level is in normal range, continue with current dose of levothyroxine. CBC shows improvement in anemia level Currently hemoglobin is in normal range, still has borderline elevated leukocytes but  stable, repeat CBC on following visit.

## 2015-03-14 ENCOUNTER — Encounter (HOSPITAL_COMMUNITY): Payer: Self-pay | Admitting: Emergency Medicine

## 2015-03-14 ENCOUNTER — Emergency Department (HOSPITAL_COMMUNITY): Payer: BLUE CROSS/BLUE SHIELD

## 2015-03-14 ENCOUNTER — Emergency Department (HOSPITAL_COMMUNITY)
Admission: EM | Admit: 2015-03-14 | Discharge: 2015-03-14 | Disposition: A | Discharge status: Home / Self Care | Payer: BLUE CROSS/BLUE SHIELD | Attending: Emergency Medicine | Admitting: Emergency Medicine

## 2015-03-14 DIAGNOSIS — Z9851 Tubal ligation status: Secondary | ICD-10-CM | POA: Insufficient documentation

## 2015-03-14 DIAGNOSIS — K858 Other acute pancreatitis without necrosis or infection: Secondary | ICD-10-CM

## 2015-03-14 DIAGNOSIS — Z79899 Other long term (current) drug therapy: Secondary | ICD-10-CM | POA: Insufficient documentation

## 2015-03-14 DIAGNOSIS — E039 Hypothyroidism, unspecified: Secondary | ICD-10-CM | POA: Insufficient documentation

## 2015-03-14 DIAGNOSIS — K859 Acute pancreatitis, unspecified: Secondary | ICD-10-CM | POA: Diagnosis not present

## 2015-03-14 DIAGNOSIS — Z87891 Personal history of nicotine dependence: Secondary | ICD-10-CM | POA: Diagnosis not present

## 2015-03-14 DIAGNOSIS — K219 Gastro-esophageal reflux disease without esophagitis: Secondary | ICD-10-CM | POA: Insufficient documentation

## 2015-03-14 DIAGNOSIS — R109 Unspecified abdominal pain: Secondary | ICD-10-CM

## 2015-03-14 DIAGNOSIS — Z87448 Personal history of other diseases of urinary system: Secondary | ICD-10-CM | POA: Diagnosis not present

## 2015-03-14 DIAGNOSIS — R1011 Right upper quadrant pain: Secondary | ICD-10-CM | POA: Diagnosis present

## 2015-03-14 DIAGNOSIS — J449 Chronic obstructive pulmonary disease, unspecified: Secondary | ICD-10-CM | POA: Diagnosis not present

## 2015-03-14 DIAGNOSIS — R319 Hematuria, unspecified: Secondary | ICD-10-CM

## 2015-03-14 LAB — CBC WITH DIFFERENTIAL/PLATELET
BASOS ABS: 0 10*3/uL (ref 0.0–0.1)
Basophils Relative: 0 % (ref 0–1)
Eosinophils Absolute: 0.2 10*3/uL (ref 0.0–0.7)
Eosinophils Relative: 1 % (ref 0–5)
HCT: 38.5 % (ref 36.0–46.0)
Hemoglobin: 12.6 g/dL (ref 12.0–15.0)
LYMPHS ABS: 2.2 10*3/uL (ref 0.7–4.0)
Lymphocytes Relative: 17 % (ref 12–46)
MCH: 27.7 pg (ref 26.0–34.0)
MCHC: 32.7 g/dL (ref 30.0–36.0)
MCV: 84.6 fL (ref 78.0–100.0)
MONO ABS: 0.9 10*3/uL (ref 0.1–1.0)
MONOS PCT: 7 % (ref 3–12)
Neutro Abs: 10.1 10*3/uL — ABNORMAL HIGH (ref 1.7–7.7)
Neutrophils Relative %: 75 % (ref 43–77)
Platelets: 158 10*3/uL (ref 150–400)
RBC: 4.55 MIL/uL (ref 3.87–5.11)
RDW: 16 % — ABNORMAL HIGH (ref 11.5–15.5)
WBC: 13.4 10*3/uL — AB (ref 4.0–10.5)

## 2015-03-14 LAB — URINALYSIS, ROUTINE W REFLEX MICROSCOPIC
Bilirubin Urine: NEGATIVE
GLUCOSE, UA: NEGATIVE mg/dL
Ketones, ur: NEGATIVE mg/dL
Leukocytes, UA: NEGATIVE
Nitrite: NEGATIVE
PROTEIN: NEGATIVE mg/dL
Specific Gravity, Urine: 1.023 (ref 1.005–1.030)
Urobilinogen, UA: 0.2 mg/dL (ref 0.0–1.0)
pH: 6.5 (ref 5.0–8.0)

## 2015-03-14 LAB — URINE MICROSCOPIC-ADD ON

## 2015-03-14 LAB — COMPREHENSIVE METABOLIC PANEL
ALT: 13 U/L — AB (ref 14–54)
AST: 15 U/L (ref 15–41)
Albumin: 3.6 g/dL (ref 3.5–5.0)
Alkaline Phosphatase: 72 U/L (ref 38–126)
Anion gap: 6 (ref 5–15)
BILIRUBIN TOTAL: 0.4 mg/dL (ref 0.3–1.2)
BUN: 8 mg/dL (ref 6–20)
CHLORIDE: 101 mmol/L (ref 101–111)
CO2: 27 mmol/L (ref 22–32)
Calcium: 8.6 mg/dL — ABNORMAL LOW (ref 8.9–10.3)
Creatinine, Ser: 0.54 mg/dL (ref 0.44–1.00)
GFR calc Af Amer: >60 mL/min (ref >60)
GFR calc non Af Amer: >60 mL/min (ref >60)
Glucose, Bld: 88 mg/dL (ref 65–99)
POTASSIUM: 3.8 mmol/L (ref 3.5–5.1)
SODIUM: 134 mmol/L — AB (ref 135–145)
Total Protein: 7.2 g/dL (ref 6.5–8.1)

## 2015-03-14 LAB — LIPASE, BLOOD: Lipase: 80 U/L — ABNORMAL HIGH (ref 22–51)

## 2015-03-14 MED ORDER — OXYCODONE-ACETAMINOPHEN 5-325 MG PO TABS: 1.0000 | ORAL_TABLET | Freq: Four times a day (QID) | ORAL | Status: DC | PRN

## 2015-03-14 MED ORDER — BARIUM SULFATE 2.1 % PO SUSP
450.0000 mL | ORAL | Status: AC
  Administered 2015-03-14: 450 mL via ORAL

## 2015-03-14 MED ORDER — IBUPROFEN 800 MG PO TABS: 800.0000 mg | ORAL_TABLET | Freq: Three times a day (TID) | ORAL | Status: DC

## 2015-03-14 MED ORDER — IOHEXOL 300 MG/ML  SOLN
100.0000 mL | Freq: Once | INTRAMUSCULAR | Status: AC | PRN
  Administered 2015-03-14: 100 mL via INTRAVENOUS

## 2015-03-14 MED ORDER — METHYLPREDNISOLONE SODIUM SUCC 125 MG IJ SOLR
125.0000 mg | Freq: Once | INTRAMUSCULAR | Status: AC
  Administered 2015-03-14: 125 mg via INTRAVENOUS
  Filled 2015-03-14: qty 2

## 2015-03-14 MED ORDER — DIPHENHYDRAMINE HCL 50 MG/ML IJ SOLN
50.0000 mg | Freq: Once | INTRAMUSCULAR | Status: AC
  Administered 2015-03-14: 50 mg via INTRAVENOUS
  Filled 2015-03-14: qty 1

## 2015-03-14 NOTE — ED Notes (Signed)
Patient currently refusing pain medication at this time.

## 2015-03-14 NOTE — ED Notes (Signed)
Per patient, started having R sided abdominal pain since Wednesday, with nausea. Denies vomiting/diarrhea,. Hx of blood in urine, hx of kidney stones, pancreatitis. Pt gallbladder is removed. Describes the pain as "burning". AAOX4, in NAD, VSS

## 2015-03-14 NOTE — ED Notes (Addendum)
Patient requesting ibuprofen for pain. NP Pickering made aware. Advised she would like to hold off on ibuprofen until results of CT scan are back.

## 2015-03-14 NOTE — ED Notes (Signed)
Finished first bottle of contrast.

## 2015-03-14 NOTE — ED Notes (Signed)
Patient transported to CT 

## 2015-03-14 NOTE — ED Provider Notes (Signed)
CSN: [id number]     Arrival date & time 03/14/15  1122 History   First MD Initiated Contact with Patient 03/14/15 1126     Chief Complaint  Patient presents with  . Abdominal Pain     (Consider location/radiation/quality/duration/timing/severity/associated sxs/prior Treatment) HPI Comments: Pt comes in with right sided abdominal pain for the last 4 days. Pt states that the pain is worsening. Pt was seen by her doctor this week and she was told that she had hematuria which is not usual. No vomiting or diarrhea. She has a history of pancreatitis but [facility] removed her gallbladder to help resolve it. No fever. She has taken ibuprofen.  The history is provided by the patient. No language interpreter was used.    Past Medical History  Diagnosis Date  . GERD (gastroesophageal reflux disease)   . Hypothyroidism   . COPD (chronic obstructive pulmonary disease)     denies SOB with daily activities  . History of pancreatitis   . Symptomatic cholelithiasis 05/2014  . Full dentures   . Nasal sinus congestion 05/08/2014    with sinus drainage  . Stress incontinence    Past Surgical History  Procedure Laterality Date  . Tubal ligation    . Eus N/A 02/19/2014    Procedure: ESOPHAGEAL ENDOSCOPIC ULTRASOUND (EUS) RADIAL;  Surgeon: Willis Modena, MD;  Location: [facility];  Service: Endoscopy;  Laterality: N/A;  . Vaginal hysterectomy  11/13/2001    transvaginal   . Breast lumpectomy    . Cholecystectomy N/A 05/13/2014    Procedure: LAPAROSCOPIC CHOLECYSTECTOMY WITH INTRAOPERATIVE CHOLANGIOGRAM;  Surgeon: Abigail Miyamoto, MD;  Location: [facility];  Service: General;  Laterality: N/A;   No family history on file. History  Substance Use Topics  . Smoking status: Former Smoker    Quit date: 05/08/2013  . Smokeless tobacco: Never Used  . Alcohol Use: Yes     Comment: rarely   OB History    No data available     Review of Systems  All other systems reviewed and are  negative.     Allergies  Fish allergy; Omnipaque; Other; Peanut-containing drug products; and Shellfish allergy  Home Medications   Prior to Admission medications   Medication Sig Start Date End Date Taking? Authorizing Provider  diphenhydrAMINE (BENADRYL) 25 MG tablet Take 12.5 mg by mouth daily as needed for itching (allergic reaction).   Yes Historical Provider, MD  Diphenhydramine-Zinc Acetate (BENADRYL EX) Apply 1 application topically daily as needed (rash).   Yes Historical Provider, MD  ibuprofen (ADVIL,MOTRIN) 200 MG tablet Take 800 mg by mouth every 8 (eight) hours as needed (pain).   Yes Historical Provider, MD  lansoprazole (PREVACID) 15 MG capsule Take 15 mg by mouth daily.    Yes Historical Provider, MD  levothyroxine (SYNTHROID) 175 MCG tablet TAKE 1 TABLET (175 MCG TOTAL) BY MOUTH DAILY BEFORE BREAKFAST. Patient taking differently: Take 175 mcg by mouth daily before breakfast.  02/19/15  Yes Doris Cheadle, MD  Polyethyl Glycol-Propyl Glycol (SYSTANE ULTRA) 0.4-0.3 % SOLN Place 1 drop into both eyes 5 (five) times daily as needed (dry eyes/irriation).   Yes Historical Provider, MD  ibuprofen (ADVIL,MOTRIN) 800 MG tablet Take 1 tablet (800 mg total) by mouth every 8 (eight) hours as needed. Patient not taking: Reported on 03/14/2015 05/13/14   Abigail Miyamoto, MD  levocetirizine (XYZAL) 5 MG tablet Take 1 tablet (5 mg total) by mouth every evening. Patient not taking: Reported on 03/14/2015 05/01/14   Doris Cheadle, MD  BP 148/93 mmHg  Pulse 94  Temp(Src) 98 F (36.7 C) (Oral)  Resp 16  SpO2 99% Physical Exam  Constitutional: She is oriented to person, place, and time. She appears well-developed and well-nourished.  HENT:  Head: Normocephalic and atraumatic.  Cardiovascular: Normal rate and regular rhythm.   Pulmonary/Chest: Effort normal and breath sounds normal.  Abdominal: Soft. Bowel sounds are normal. There is tenderness in the right upper quadrant, right lower  quadrant and left upper quadrant.  Musculoskeletal: Normal range of motion.  Neurological: She is alert and oriented to person, place, and time.  Skin: Skin is warm and dry.  Psychiatric: She has a normal mood and affect.  Nursing note and vitals reviewed.   ED Course  Procedures (including critical care time) Labs Review Labs Reviewed  CBC WITH DIFFERENTIAL/PLATELET - Abnormal; Notable for the following:    WBC 13.4 (*)    RDW 16.0 (*)    Neutro Abs 10.1 (*)    All other components within normal limits  URINALYSIS, ROUTINE W REFLEX MICROSCOPIC (NOT AT [facility]) - Abnormal; Notable for the following:    Hgb urine dipstick MODERATE (*)    All other components within normal limits  URINE MICROSCOPIC-ADD ON - Abnormal; Notable for the following:    Squamous Epithelial / LPF FEW (*)    All other components within normal limits  COMPREHENSIVE METABOLIC PANEL  LIPASE, BLOOD    Imaging Review Ct Abdomen Pelvis W Contrast  03/14/2015   CLINICAL DATA:  Right-sided abdominal pain for 3 days, elevated lipase  EXAM: CT ABDOMEN AND PELVIS WITH CONTRAST  TECHNIQUE: Multidetector CT imaging of the abdomen and pelvis was performed using the standard protocol following bolus administration of intravenous contrast.  CONTRAST:  OMNIPAQUE IOHEXOL 300 MG/ML  SOLN  COMPARISON:  12/06/2013  FINDINGS: Lower chest:  Clear  Hepatobiliary: 3 cm right hepatic cysts stable. Gallbladder surgically absent. 4 mm cyst inferior tip of the liver on the right.  Pancreas: Enlarged of the pancreatic head with mild surrounding inflammatory change and heterogeneous attenuation of the pancreatic head. Associated wall thickening of the medial edge of the adjacent duodenum. No evidence of pseudocyst.  Spleen: Normal  Adrenals/Urinary Tract: Peripelvic cyst lower pole left kidney measuring a cm. 1 cm cyst lower pole right kidney. 7 mm cyst upper pole right kidney. 2 mm nonobstructing upper pole right renal stone. Bladder normal.   Stomach/Bowel: Significant diverticulosis of the sigmoid colon and to a lesser degree descending colon. Asymmetric thickening involving the cecum. Appendix normal. Stomach small bowel normal except for duodenum as described above.  Vascular/Lymphatic: Mild calcification of the aortoiliac vessels  Reproductive: Uterus appears to be absent. Right ovary decreased in size when compared to prior study now measuring 29 x 24 mm. 1 cm focus of low attenuation likely represents ovarian cyst or follicle. Similar appearance to left ovary.  Other: No additional findings  Musculoskeletal: No acute abnormalities  IMPRESSION: Findings consistent with acute pancreatitis   Electronically Signed   By: Esperanza Heir M.D.   On: 03/14/2015 15:48     EKG Interpretation None      MDM   Final diagnoses:  Right sided abdominal pain  Other acute pancreatitis  Hematuria    Pt is okay to go home with with clear liquids and oxycodone. She states that she can follow up with [facility]. Discussed return precautions with pt    Teressa Lower, NP 03/14/15 1620  Gilda Crease, MD 03/14/15 [phone]

## 2015-03-14 NOTE — ED Notes (Signed)
CT at bedside explanation given to patient drink berry smoothie for CT prep due to allergy.

## 2015-03-14 NOTE — Discharge Instructions (Signed)

## 2015-03-14 NOTE — ED Notes (Signed)
Per CT Benadryl and Solu-Medrol to be given at 1330.

## 2015-03-16 ENCOUNTER — Inpatient Hospital Stay (HOSPITAL_COMMUNITY)
Admission: EM | Admit: 2015-03-16 | Discharge: 2015-03-20 | DRG: 440 | Disposition: A | Discharge status: Home / Self Care | Payer: BLUE CROSS/BLUE SHIELD | Attending: Internal Medicine | Admitting: Internal Medicine

## 2015-03-16 ENCOUNTER — Encounter (HOSPITAL_COMMUNITY): Payer: Self-pay

## 2015-03-16 DIAGNOSIS — E039 Hypothyroidism, unspecified: Secondary | ICD-10-CM | POA: Diagnosis present

## 2015-03-16 DIAGNOSIS — K859 Acute pancreatitis without necrosis or infection, unspecified: Secondary | ICD-10-CM | POA: Diagnosis present

## 2015-03-16 DIAGNOSIS — Z833 Family history of diabetes mellitus: Secondary | ICD-10-CM | POA: Diagnosis not present

## 2015-03-16 DIAGNOSIS — R52 Pain, unspecified: Secondary | ICD-10-CM

## 2015-03-16 DIAGNOSIS — K858 Other acute pancreatitis: Secondary | ICD-10-CM | POA: Diagnosis not present

## 2015-03-16 DIAGNOSIS — Z79899 Other long term (current) drug therapy: Secondary | ICD-10-CM | POA: Diagnosis not present

## 2015-03-16 DIAGNOSIS — K861 Other chronic pancreatitis: Secondary | ICD-10-CM | POA: Diagnosis present

## 2015-03-16 DIAGNOSIS — Z87891 Personal history of nicotine dependence: Secondary | ICD-10-CM | POA: Diagnosis not present

## 2015-03-16 DIAGNOSIS — R109 Unspecified abdominal pain: Secondary | ICD-10-CM | POA: Diagnosis present

## 2015-03-16 DIAGNOSIS — J449 Chronic obstructive pulmonary disease, unspecified: Secondary | ICD-10-CM | POA: Diagnosis present

## 2015-03-16 DIAGNOSIS — E876 Hypokalemia: Secondary | ICD-10-CM | POA: Diagnosis present

## 2015-03-16 DIAGNOSIS — D72829 Elevated white blood cell count, unspecified: Secondary | ICD-10-CM

## 2015-03-16 DIAGNOSIS — K852 Alcohol induced acute pancreatitis: Secondary | ICD-10-CM | POA: Diagnosis not present

## 2015-03-16 DIAGNOSIS — K850 Idiopathic acute pancreatitis: Secondary | ICD-10-CM | POA: Diagnosis not present

## 2015-03-16 DIAGNOSIS — K219 Gastro-esophageal reflux disease without esophagitis: Secondary | ICD-10-CM | POA: Diagnosis present

## 2015-03-16 DIAGNOSIS — Z9049 Acquired absence of other specified parts of digestive tract: Secondary | ICD-10-CM | POA: Diagnosis present

## 2015-03-16 DIAGNOSIS — Z8249 Family history of ischemic heart disease and other diseases of the circulatory system: Secondary | ICD-10-CM

## 2015-03-16 DIAGNOSIS — R197 Diarrhea, unspecified: Secondary | ICD-10-CM

## 2015-03-16 DIAGNOSIS — E038 Other specified hypothyroidism: Secondary | ICD-10-CM | POA: Diagnosis not present

## 2015-03-16 DIAGNOSIS — R1110 Vomiting, unspecified: Secondary | ICD-10-CM

## 2015-03-16 LAB — COMPREHENSIVE METABOLIC PANEL
ALBUMIN: 4.1 g/dL (ref 3.5–5.0)
ALT: 14 U/L (ref 14–54)
ANION GAP: 8 (ref 5–15)
AST: 16 U/L (ref 15–41)
Alkaline Phosphatase: 87 U/L (ref 38–126)
BUN: 12 mg/dL (ref 6–20)
CALCIUM: 9.3 mg/dL (ref 8.9–10.3)
CO2: 30 mmol/L (ref 22–32)
Chloride: 98 mmol/L — ABNORMAL LOW (ref 101–111)
Creatinine, Ser: 0.6 mg/dL (ref 0.44–1.00)
GFR calc non Af Amer: >60 mL/min (ref >60)
GLUCOSE: 109 mg/dL — AB (ref 65–99)
POTASSIUM: 3.5 mmol/L (ref 3.5–5.1)
SODIUM: 136 mmol/L (ref 135–145)
TOTAL PROTEIN: 9.1 g/dL — AB (ref 6.5–8.1)
Total Bilirubin: 0.6 mg/dL (ref 0.3–1.2)

## 2015-03-16 LAB — TSH: TSH: 1.09 u[IU]/mL (ref 0.350–4.500)

## 2015-03-16 LAB — URINALYSIS, ROUTINE W REFLEX MICROSCOPIC
Bilirubin Urine: NEGATIVE
Glucose, UA: NEGATIVE mg/dL
Ketones, ur: 40 mg/dL — AB
Leukocytes, UA: NEGATIVE
NITRITE: NEGATIVE
PH: 6.5 (ref 5.0–8.0)
Protein, ur: NEGATIVE mg/dL
Specific Gravity, Urine: 1.015 (ref 1.005–1.030)
Urobilinogen, UA: 0.2 mg/dL (ref 0.0–1.0)

## 2015-03-16 LAB — CBC
HCT: 43.7 % (ref 36.0–46.0)
Hemoglobin: 14.1 g/dL (ref 12.0–15.0)
MCH: 27.1 pg (ref 26.0–34.0)
MCHC: 32.3 g/dL (ref 30.0–36.0)
MCV: 84 fL (ref 78.0–100.0)
PLATELETS: 229 10*3/uL (ref 150–400)
RBC: 5.2 MIL/uL — ABNORMAL HIGH (ref 3.87–5.11)
RDW: 15.6 % — ABNORMAL HIGH (ref 11.5–15.5)
WBC: 22.7 10*3/uL — ABNORMAL HIGH (ref 4.0–10.5)

## 2015-03-16 LAB — LIPASE, BLOOD: Lipase: 21 U/L — ABNORMAL LOW (ref 22–51)

## 2015-03-16 LAB — URINE MICROSCOPIC-ADD ON

## 2015-03-16 LAB — I-STAT BETA HCG BLOOD, ED (MC, WL, AP ONLY)

## 2015-03-16 LAB — ETHANOL

## 2015-03-16 MED ORDER — ONDANSETRON HCL 4 MG/2ML IJ SOLN: 4.0000 mg | Freq: Four times a day (QID) | INTRAMUSCULAR | Status: DC | PRN

## 2015-03-16 MED ORDER — PANTOPRAZOLE SODIUM 40 MG IV SOLR
40.0000 mg | Freq: Once | INTRAVENOUS | Status: AC
  Administered 2015-03-16: 40 mg via INTRAVENOUS
  Filled 2015-03-16: qty 40

## 2015-03-16 MED ORDER — DIPHENHYDRAMINE HCL 12.5 MG/5ML PO ELIX: 12.5000 mg | ORAL_SOLUTION | Freq: Four times a day (QID) | ORAL | Status: DC | PRN

## 2015-03-16 MED ORDER — ENOXAPARIN SODIUM 40 MG/0.4ML ~~LOC~~ SOLN
40.0000 mg | SUBCUTANEOUS | Status: DC
  Filled 2015-03-16 (×5): qty 0.4

## 2015-03-16 MED ORDER — SODIUM CHLORIDE 0.9 % IV BOLUS (SEPSIS)
1000.0000 mL | Freq: Once | INTRAVENOUS | Status: AC
  Administered 2015-03-16: 1000 mL via INTRAVENOUS

## 2015-03-16 MED ORDER — SODIUM CHLORIDE 0.9 % IV SOLN: INTRAVENOUS | Status: AC

## 2015-03-16 MED ORDER — SODIUM CHLORIDE 0.9 % IJ SOLN: 9.0000 mL | INTRAMUSCULAR | Status: DC | PRN

## 2015-03-16 MED ORDER — HYDROMORPHONE HCL 1 MG/ML IJ SOLN: 1.0000 mg | INTRAMUSCULAR | Status: DC | PRN

## 2015-03-16 MED ORDER — ONDANSETRON HCL 4 MG PO TABS: 4.0000 mg | ORAL_TABLET | Freq: Four times a day (QID) | ORAL | Status: DC | PRN

## 2015-03-16 MED ORDER — DIPHENHYDRAMINE HCL 50 MG/ML IJ SOLN: 12.5000 mg | Freq: Four times a day (QID) | INTRAMUSCULAR | Status: DC | PRN

## 2015-03-16 MED ORDER — ONDANSETRON HCL 4 MG/2ML IJ SOLN: 4.0000 mg | Freq: Three times a day (TID) | INTRAMUSCULAR | Status: DC | PRN

## 2015-03-16 MED ORDER — MORPHINE SULFATE 2 MG/ML IJ SOLN
2.0000 mg | INTRAMUSCULAR | Status: DC | PRN
  Administered 2015-03-16: 2 mg via INTRAVENOUS
  Filled 2015-03-16: qty 1

## 2015-03-16 MED ORDER — DIPHENHYDRAMINE HCL 25 MG PO TABS
12.5000 mg | ORAL_TABLET | Freq: Every day | ORAL | Status: DC | PRN
  Filled 2015-03-16: qty 0.5

## 2015-03-16 MED ORDER — POTASSIUM CHLORIDE IN NACL 20-0.9 MEQ/L-% IV SOLN
INTRAVENOUS | Status: DC
Start: 2015-03-16 — End: 2015-03-17
  Administered 2015-03-16: 1000 mL via INTRAVENOUS
  Administered 2015-03-17: 10:00:00 via INTRAVENOUS
  Filled 2015-03-16 (×2): qty 1000

## 2015-03-16 MED ORDER — LEVOTHYROXINE SODIUM 175 MCG PO TABS
175.0000 ug | ORAL_TABLET | Freq: Every day | ORAL | Status: DC
  Administered 2015-03-17 – 2015-03-20 (×4): 175 ug via ORAL
  Filled 2015-03-16 (×5): qty 1

## 2015-03-16 MED ORDER — POLYETHYLENE GLYCOL 3350 17 G PO PACK: 17.0000 g | PACK | Freq: Every day | ORAL | Status: DC | PRN

## 2015-03-16 MED ORDER — MORPHINE SULFATE 1 MG/ML IV SOLN
INTRAVENOUS | Status: DC
  Administered 2015-03-16 – 2015-03-17 (×2): 1 mg via INTRAVENOUS
  Administered 2015-03-17: 2 mg via INTRAVENOUS
  Administered 2015-03-17: 1 mg via INTRAVENOUS
  Administered 2015-03-17: 3 mg via INTRAVENOUS
  Administered 2015-03-17: 1 mg via INTRAVENOUS
  Administered 2015-03-17: 0 mg via INTRAVENOUS
  Administered 2015-03-18: 1 mg via INTRAVENOUS
  Filled 2015-03-16: qty 25

## 2015-03-16 MED ORDER — NALOXONE HCL 0.4 MG/ML IJ SOLN: 0.4000 mg | INTRAMUSCULAR | Status: DC | PRN

## 2015-03-16 NOTE — ED Provider Notes (Signed)
Medical screening examination/treatment/procedure(s) were conducted as a shared visit with non-physician practitioner(s) and myself.  I personally evaluated the patient during the encounter.   EKG Interpretation None      49 yo female with a history of pancreatitis who presents with epigastric and LUQ pain.  Seen in ED a few days ago at which time CT showed evidence of acute pancreatitis.  She attempted outpatient therapy, but has had worsening pain and vomiting.  On exam, nontoxic, not distressed, normal respiratory effort, normal perfusion, abdomen soft, but tender on epigastrium and left, worst in LUQ, no r/r/g.  Admit.   Clinical Impression: 1. Intractable vomiting with nausea, vomiting of unspecified type   2. Acute pancreatitis, unspecified pancreatitis type       Blake Divine, MD 03/16/15 1840

## 2015-03-16 NOTE — ED Provider Notes (Signed)
CSN: [id number]     Arrival date & time 03/16/15  1106 History   First MD Initiated Contact with Patient 03/16/15 1502     Chief Complaint  Patient presents with  . Abdominal Pain     (Consider location/radiation/quality/duration/timing/severity/associated sxs/prior Treatment) HPI Comments: Patient with history of pancreatitis, past cholecystectomy presents with complaint of continued abdominal pain described as burning with radiation to her back which started 5 days ago. Patient was seen in emergency department 2 days ago and had a CT scan demonstrating pancreatitis. She had a mildly elevated lipase at that point. Other labs were unremarkable. Patient went home and she began having frequent episodes of vomiting which she has had over the past 48 hours. She has been able to hold down very little in the way of liquids. Pain feels similar to previous flares of pancreatitis. She denies fever, chest pain, shortness of breath, diarrhea, blood in stool, urinary symptoms. Patient has been treating her pain at home with ibuprofen which does seem to control her symptoms well.  Patient states she called her GI, Dr. Dulce Sellar, today and office there told her to come to ED for evaluation and possible admission.   Patient is a 49 y.o. female presenting with abdominal pain. The history is provided by the patient.  Abdominal Pain Pain location:  RUQ and epigastric Associated symptoms: nausea and vomiting   Associated symptoms: no chest pain, no cough, no diarrhea, no dysuria, no fever, no shortness of breath and no sore throat     Past Medical History  Diagnosis Date  . GERD (gastroesophageal reflux disease)   . Hypothyroidism   . COPD (chronic obstructive pulmonary disease)     denies SOB with daily activities  . History of pancreatitis   . Symptomatic cholelithiasis 05/2014  . Full dentures   . Nasal sinus congestion 05/08/2014    with sinus drainage  . Stress incontinence    Past Surgical History   Procedure Laterality Date  . Tubal ligation    . Eus N/A 02/19/2014    Procedure: ESOPHAGEAL ENDOSCOPIC ULTRASOUND (EUS) RADIAL;  Surgeon: Willis Modena, MD;  Location: [facility];  Service: Endoscopy;  Laterality: N/A;  . Vaginal hysterectomy  11/13/2001    transvaginal   . Breast lumpectomy    . Cholecystectomy N/A 05/13/2014    Procedure: LAPAROSCOPIC CHOLECYSTECTOMY WITH INTRAOPERATIVE CHOLANGIOGRAM;  Surgeon: Abigail Miyamoto, MD;  Location: [facility];  Service: General;  Laterality: N/A;   Family History  Problem Relation Age of Onset  . Hypertension Mother   . Hypertension Sister   . Diabetes Sister   . Hypertension Brother    History  Substance Use Topics  . Smoking status: Former Smoker    Quit date: 05/08/2013  . Smokeless tobacco: Never Used  . Alcohol Use: Yes     Comment: rarely   OB History    No data available     Review of Systems  Constitutional: Negative for fever.  HENT: Negative for rhinorrhea and sore throat.   Eyes: Negative for redness.  Respiratory: Negative for cough and shortness of breath.   Cardiovascular: Negative for chest pain.  Gastrointestinal: Positive for nausea, vomiting and abdominal pain. Negative for diarrhea.  Genitourinary: Negative for dysuria.  Musculoskeletal: Negative for myalgias.  Skin: Negative for rash.  Neurological: Negative for headaches.    Allergies  Fish allergy; Omnipaque; Other; Peanut-containing drug products; and Shellfish allergy  Home Medications   Prior to Admission medications   Medication Sig Start  Date End Date Taking? Authorizing Provider  diphenhydrAMINE (BENADRYL) 25 MG tablet Take 12.5 mg by mouth daily as needed for itching (allergic reaction).   Yes Historical Provider, MD  Diphenhydramine-Zinc Acetate (BENADRYL EX) Apply 1 application topically daily as needed (rash).   Yes Historical Provider, MD  ibuprofen (ADVIL,MOTRIN) 200 MG tablet Take 800 mg by mouth every 8 (eight) hours  as needed (pain).   Yes Historical Provider, MD  ibuprofen (ADVIL,MOTRIN) 800 MG tablet Take 1 tablet (800 mg total) by mouth 3 (three) times daily. 03/14/15  Yes Teressa Lower, NP  lansoprazole (PREVACID) 15 MG capsule Take 15 mg by mouth daily.    Yes Historical Provider, MD  levothyroxine (SYNTHROID) 175 MCG tablet TAKE 1 TABLET (175 MCG TOTAL) BY MOUTH DAILY BEFORE BREAKFAST. Patient taking differently: Take 175 mcg by mouth daily before breakfast.  02/19/15  Yes Doris Cheadle, MD  oxyCODONE-acetaminophen (PERCOCET/ROXICET) 5-325 MG per tablet Take 1 tablet by mouth every 6 (six) hours as needed for severe pain. Patient taking differently: Take 0.5 tablets by mouth every 6 (six) hours as needed for severe pain.  03/14/15  Yes Teressa Lower, NP  Polyethyl Glycol-Propyl Glycol (SYSTANE ULTRA) 0.4-0.3 % SOLN Place 1 drop into both eyes 5 (five) times daily as needed (dry eyes/irriation).   Yes Historical Provider, MD  levocetirizine (XYZAL) 5 MG tablet Take 1 tablet (5 mg total) by mouth every evening. Patient not taking: Reported on 03/14/2015 05/01/14   Doris Cheadle, MD   BP 133/88 mmHg  Pulse 84  Temp(Src) 97.8 F (36.6 C) (Oral)  Resp 18  Ht  (1.651 m)  Wt 205 lb (92.987 kg)  BMI 34.11 kg/m2  SpO2 96%   Physical Exam  Constitutional: She appears well-developed and well-nourished.  HENT:  Head: Normocephalic and atraumatic.  Eyes: Conjunctivae are normal. Right eye exhibits no discharge. Left eye exhibits no discharge.  Neck: Normal range of motion. Neck supple.  Cardiovascular: Normal rate, regular rhythm and normal heart sounds.   Pulmonary/Chest: Effort normal and breath sounds normal. No respiratory distress. She has no wheezes. She has no rales.  Abdominal: Soft. There is tenderness (moderate) in the right upper quadrant and epigastric area. There is no rebound and no guarding.  Neurological: She is alert.  Skin: Skin is warm and dry.  Psychiatric: She has a normal mood  and affect.  Nursing note and vitals reviewed.   ED Course  Procedures (including critical care time) Labs Review Labs Reviewed  LIPASE, BLOOD - Abnormal; Notable for the following:    Lipase 21 (*)    All other components within normal limits  COMPREHENSIVE METABOLIC PANEL - Abnormal; Notable for the following:    Chloride 98 (*)    Glucose, Bld 109 (*)    Total Protein 9.1 (*)    All other components within normal limits  CBC - Abnormal; Notable for the following:    WBC 22.7 (*)    RBC 5.20 (*)    RDW 15.6 (*)    All other components within normal limits  URINALYSIS, ROUTINE W REFLEX MICROSCOPIC (NOT AT [facility])  I-STAT BETA HCG BLOOD, ED ([facility], [facility], [facility] ONLY)    Imaging Review No results found.   EKG Interpretation None       3:25 PM Patient seen and examined. Work-up initiated. Fluids ordered.   Vital signs reviewed and are as follows: BP 133/88 mmHg  Pulse 84  Temp(Src) 97.8 F (36.6 C) (Oral)  Resp 18  Ht 5'  5" (1.651 m)  Wt 205 lb (92.987 kg)  BMI 34.11 kg/m2  SpO2 96%  4:33 PM Patient discussed with Dr. Loretha Stapler.   Spoke with Dr. David Stall who will see and admit.    MDM   Final diagnoses:  Intractable vomiting with nausea, vomiting of unspecified type  Acute pancreatitis, unspecified pancreatitis type   Admit.    Renne Crigler, PA-C 03/16/15 1634  Blake Divine, MD 03/16/15 1840

## 2015-03-16 NOTE — H&P (Signed)
[facility] History and Physical  Dana Martin ZOX:[id number] DOB: Jan 15, 1966 DOA: 03/16/2015  Referring physician: Sharia Reeve PA PCP: Doris Cheadle, MD   Chief Complaint: abd pain  HPI: Dana Martin is a 49 y.o. female with past medical history of recurrent pancreatitis status post cholecystectomy back in October 2015 the comes in for nausea vomiting and abdominal pain distorted 5 days prior to admission. She relates that the last time she consumed alcohol was 4 days prior to that and it was only 1 glass of wine. She relates her pain radiates straight to the back movement makes it worse laying still makes it better. She relates she was tried on manage this at home but she couldn't she came yesterday to the ED on CBC was done that showed a white count of 12 with a lipase of a admission much better after some IV fluids and IV narcotics and she went home when she got home the pain got so severe that she could barely walk. She says is bothering her just to ride in the car. She relates no new medication and no changes in her medication.  In the ED:  CT scan was done that showed inflammation around the pancreas head, her lipase was low at 18 she had a white count of 22,000 so we're consulted for further evaluation.   Review of Systems:  Constitutional:  No weight loss, night sweats, Fevers, chills, fatigue.  HEENT:  No headaches, Difficulty swallowing,Tooth/dental problems,Sore throat,  No sneezing, itching, ear ache, nasal congestion, post nasal drip,  Cardio-vascular:  No chest pain, Orthopnea, PND, swelling in lower extremities, anasarca, dizziness, palpitations  GI:  No heartburn, indigestion, , change in bowel habits, loss of appetite  Resp:  No shortness of breath with exertion or at rest. No excess mucus, no productive cough, No non-productive cough, No coughing up of blood.No change in color of mucus.No wheezing.No chest wall deformity  Skin:  no rash or lesions.  GU:  no  dysuria, change in color of urine, no urgency or frequency. No flank pain.  Musculoskeletal:  No joint pain or swelling. No decreased range of motion. No back pain.  Psych:  No change in mood or affect. No depression or anxiety. No memory loss.   Past Medical History  Diagnosis Date  . GERD (gastroesophageal reflux disease)   . Hypothyroidism   . COPD (chronic obstructive pulmonary disease)     denies SOB with daily activities  . History of pancreatitis   . Symptomatic cholelithiasis 05/2014  . Full dentures   . Nasal sinus congestion 05/08/2014    with sinus drainage  . Stress incontinence    Past Surgical History  Procedure Laterality Date  . Tubal ligation    . Eus N/A 02/19/2014    Procedure: ESOPHAGEAL ENDOSCOPIC ULTRASOUND (EUS) RADIAL;  Surgeon: Willis Modena, MD;  Location: [facility];  Service: Endoscopy;  Laterality: N/A;  . Vaginal hysterectomy  11/13/2001    transvaginal   . Breast lumpectomy    . Cholecystectomy N/A 05/13/2014    Procedure: LAPAROSCOPIC CHOLECYSTECTOMY WITH INTRAOPERATIVE CHOLANGIOGRAM;  Surgeon: Abigail Miyamoto, MD;  Location: [facility];  Service: General;  Laterality: N/A;   Social History:  reports that she quit smoking about 22 months ago. She has never used smokeless tobacco. She reports that she drinks alcohol. She reports that she does not use illicit drugs.  Allergies  Allergen Reactions  . Fish Allergy Hives  . Omnipaque [Iohexol] Hives  . Other Nausea  And Vomiting    ALL NARCOTICS  . Peanut-Containing Drug Products Hives  . Shellfish Allergy Hives    Family History  Problem Relation Age of Onset  . Hypertension Mother   . Hypertension Sister   . Diabetes Sister   . Hypertension Brother     Prior to Admission medications   Medication Sig Start Date End Date Taking? Authorizing Provider  diphenhydrAMINE (BENADRYL) 25 MG tablet Take 12.5 mg by mouth daily as needed for itching (allergic reaction).   Yes Historical  Provider, MD  Diphenhydramine-Zinc Acetate (BENADRYL EX) Apply 1 application topically daily as needed (rash).   Yes Historical Provider, MD  ibuprofen (ADVIL,MOTRIN) 200 MG tablet Take 800 mg by mouth every 8 (eight) hours as needed (pain).   Yes Historical Provider, MD  ibuprofen (ADVIL,MOTRIN) 800 MG tablet Take 1 tablet (800 mg total) by mouth 3 (three) times daily. 03/14/15  Yes Teressa Lower, NP  lansoprazole (PREVACID) 15 MG capsule Take 15 mg by mouth daily.    Yes Historical Provider, MD  levothyroxine (SYNTHROID) 175 MCG tablet TAKE 1 TABLET (175 MCG TOTAL) BY MOUTH DAILY BEFORE BREAKFAST. Patient taking differently: Take 175 mcg by mouth daily before breakfast.  02/19/15  Yes Doris Cheadle, MD  oxyCODONE-acetaminophen (PERCOCET/ROXICET) 5-325 MG per tablet Take 1 tablet by mouth every 6 (six) hours as needed for severe pain. Patient taking differently: Take 0.5 tablets by mouth every 6 (six) hours as needed for severe pain.  03/14/15  Yes Teressa Lower, NP  Polyethyl Glycol-Propyl Glycol (SYSTANE ULTRA) 0.4-0.3 % SOLN Place 1 drop into both eyes 5 (five) times daily as needed (dry eyes/irriation).   Yes Historical Provider, MD  levocetirizine (XYZAL) 5 MG tablet Take 1 tablet (5 mg total) by mouth every evening. Patient not taking: Reported on 03/14/2015 05/01/14   Doris Cheadle, MD   Physical Exam: Filed Vitals:   03/16/15 1123 03/16/15 1428  BP: 131/91 133/88  Pulse: 110 84  Temp: 97.8 F (36.6 C)   TempSrc: Oral   Resp: 18 18  Height: 5\' 5"  (1.651 m)   Weight: 92.987 kg (205 lb)   SpO2: 97% 96%    Wt Readings from Last 3 Encounters:  03/16/15 92.987 kg (205 lb)  03/12/15 92.987 kg (205 lb)  08/05/14 89.268 kg (196 lb 12.8 oz)    General:  Appears calm and comfortable Eyes: PERRL, normal lids, irises & conjunctiva ENT: grossly normal hearing, lips & tongue Neck: no LAD, masses or thyromegaly Cardiovascular: RRR, no m/r/g. No LE edema. Telemetry: SR, no arrhythmias    Respiratory: CTA bilaterally, no w/r/r. Normal respiratory effort. Abdomen: soft, tender mainly in the epigastric area, no rebound or guarding. Skin: no rash or induration seen on limited exam Musculoskeletal: grossly normal tone BUE/BLE Psychiatric: grossly normal mood and affect, speech fluent and appropriate Neurologic: grossly non-focal.          Labs on Admission:  Basic Metabolic Panel:  Recent Labs Lab 03/12/15 1021 03/14/15 1152 03/16/15 1208  NA 138 134* 136  K 4.6 3.8 3.5  CL 100 101 98*  CO2 29 27 30   GLUCOSE 83 88 109*  BUN 7 8 12   CREATININE 0.60 0.54 0.60  CALCIUM 9.0 8.6* 9.3   Liver Function Tests:  Recent Labs Lab 03/12/15 1021 03/14/15 1152 03/16/15 1208  AST 13 15 16   ALT 12 13* 14  ALKPHOS 85 72 87  BILITOT 0.5 0.4 0.6  PROT 7.4 7.2 9.1*  ALBUMIN 4.0 3.6 4.1  Recent Labs Lab 03/14/15 1152 03/16/15 1208  LIPASE 80* 21*   No results for input(s): AMMONIA in the last 168 hours. CBC:  Recent Labs Lab 03/12/15 1021 03/14/15 1152 03/16/15 1208  WBC 12.6* 13.4* 22.7*  NEUTROABS 9.2* 10.1*  --   HGB 13.5 12.6 14.1  HCT 41.0 38.5 43.7  MCV 82.2 84.6 84.0  PLT 173 158 229   Cardiac Enzymes: No results for input(s): CKTOTAL, CKMB, CKMBINDEX, TROPONINI in the last 168 hours.  BNP (last 3 results) No results for input(s): BNP in the last 8760 hours.  ProBNP (last 3 results) No results for input(s): PROBNP in the last 8760 hours.  CBG: No results for input(s): GLUCAP in the last 168 hours.  Radiological Exams on Admission: No results found.  EKG: Independently reviewed. none  Assessment/Plan Active Problems:   Acute pancreatitis   Leukocytosis  - It is unlikely this alcohol-related, will check an alcohol level alkaline phosphatase is normal also check a GGT. She has a significant leukocytosis, this most likely reactive due to her pancreatitis, but was still check blood cultures will monitor fever curve. - We'll place her  nothing by mouth strict I's and O's, IV hydration, and a PCA pump. - Triglycerides have been checked in the past for cause of her pancreatitis she doesn't have her gallbladder. She has had no change in her medication and a new medication So we'll check an ANA and a rheumatoid factor.  Code Status: full DVT Prophylaxis:lovenox Family Communication: husband Disposition Plan: inpatient  Time spent: 65 min  Dana Martin [facility] Pager [phone]

## 2015-03-16 NOTE — ED Notes (Signed)
Patient states she was treated 2 days ago for abdominal pain and was told to treat her pancreatitis with a clear liquid diet. Patient states she has been vomiting and unable to keep liquids down. Patient denies any diarrhea or fever. Patient last had a BM 2 days ago.

## 2015-03-16 NOTE — ED Notes (Signed)
Patient is unable to urinate at this time. States she "hasn't hardly drank anything in two days."

## 2015-03-16 NOTE — ED Notes (Signed)
Pt requesting to use the restroom before blood draw.

## 2015-03-16 NOTE — ED Notes (Signed)
Pt aware of need for urine sample.  

## 2015-03-16 NOTE — ED Notes (Signed)
Attempted to give report. RN not available , will f/u

## 2015-03-16 NOTE — ED Notes (Signed)
Unable to collect labs at this time patient not in room. 

## 2015-03-17 DIAGNOSIS — K850 Idiopathic acute pancreatitis: Secondary | ICD-10-CM

## 2015-03-17 LAB — BASIC METABOLIC PANEL
Anion gap: 8 (ref 5–15)
BUN: 8 mg/dL (ref 6–20)
CO2: 26 mmol/L (ref 22–32)
Calcium: 8.5 mg/dL — ABNORMAL LOW (ref 8.9–10.3)
Chloride: 101 mmol/L (ref 101–111)
Creatinine, Ser: 0.38 mg/dL — ABNORMAL LOW (ref 0.44–1.00)
GFR calc Af Amer: >60 mL/min (ref >60)
GFR calc non Af Amer: >60 mL/min (ref >60)
Glucose, Bld: 87 mg/dL (ref 65–99)
POTASSIUM: 3.5 mmol/L (ref 3.5–5.1)
SODIUM: 135 mmol/L (ref 135–145)

## 2015-03-17 LAB — CBC
HCT: 37.7 % (ref 36.0–46.0)
Hemoglobin: 12.3 g/dL (ref 12.0–15.0)
MCH: 27.8 pg (ref 26.0–34.0)
MCHC: 32.6 g/dL (ref 30.0–36.0)
MCV: 85.1 fL (ref 78.0–100.0)
Platelets: 207 10*3/uL (ref 150–400)
RBC: 4.43 MIL/uL (ref 3.87–5.11)
RDW: 15.8 % — AB (ref 11.5–15.5)
WBC: 19.9 10*3/uL — ABNORMAL HIGH (ref 4.0–10.5)

## 2015-03-17 MED ORDER — POTASSIUM CHLORIDE 2 MEQ/ML IV SOLN: INTRAVENOUS | Status: DC

## 2015-03-17 MED ORDER — KCL IN DEXTROSE-NACL 20-5-0.45 MEQ/L-%-% IV SOLN
INTRAVENOUS | Status: AC
  Administered 2015-03-17: 12:00:00 via INTRAVENOUS
  Filled 2015-03-17 (×3): qty 1000

## 2015-03-17 NOTE — Progress Notes (Signed)
   [facility] PROGRESS NOTE  Assessment/Plan: Acute pancreatitis/ Leukocytosis - still with pain cont PCA pump. - NPO change IV fluids to 0.45.D5w with K. - leukocytosis improved.   Code Status: full Family Communication: husband  Disposition Plan: home in 2-3 days   Consultants:  none  Procedures:  CT abd  Antibiotics:  None  HPI/Subjective: Still with abd pain  Objective: Filed Vitals:   03/17/15 0036 03/17/15 0337 03/17/15 0555 03/17/15 0810  BP:   144/93   Pulse:   93   Temp:   99.2 F (37.3 C)   TempSrc:   Oral   Resp: Height:      Weight:      SpO2: 98% 95% 97% 98%    Intake/Output Summary (Last 24 hours) at 03/17/15 1016 Last data filed at 03/17/15 0849  Gross per 24 hour  Intake 2758.75 ml  Output   1150 ml  Net 1608.75 ml   Filed Weights   03/16/15 1123  Weight: 92.987 kg (205 lb)    Exam:  General: Alert, awake, oriented x3, in no acute distress.  HEENT: No bruits, no goiter.  Heart: Regular rate and rhythm. Lungs: Good air movement, clear Abdomen: Soft, nontender, nondistended, positive bowel sounds.  Neuro: Grossly intact, nonfocal.   Data Reviewed: Basic Metabolic Panel:  Recent Labs Lab 03/12/15 1021 03/14/15 1152 03/16/15 1208 03/17/15 0600  NA 138 134* 136 135  K 4.6 3.8 3.5 3.5  CL 100 101 98* 101  CO2 GLUCOSE 83 88 109* 87  BUN CREATININE 0.60 0.54 0.60 0.38*  CALCIUM 9.0 8.6* 9.3 8.5*   Liver Function Tests:  Recent Labs Lab 03/12/15 1021 03/14/15 1152 03/16/15 1208  AST ALT 12 13* 14  ALKPHOS 85 72 87  BILITOT 0.5 0.4 0.6  PROT 7.4 7.2 9.1*  ALBUMIN 4.0 3.6 4.1    Recent Labs Lab 03/14/15 1152 03/16/15 1208  LIPASE 80* 21*   No results for input(s): AMMONIA in the last 168 hours. CBC:  Recent Labs Lab 03/12/15 1021 03/14/15 1152 03/16/15 1208 03/17/15 0600  WBC 12.6* 13.4* 22.7* 19.9*  NEUTROABS 9.2* 10.1*  --   --   HGB 13.5  12.6 14.1 12.3  HCT 41.0 38.5 43.7 37.7  MCV 82.2 84.6 84.0 85.1  PLT 173 158 229 207   Cardiac Enzymes: No results for input(s): CKTOTAL, CKMB, CKMBINDEX, TROPONINI in the last 168 hours. BNP (last 3 results) No results for input(s): BNP in the last 8760 hours.  ProBNP (last 3 results) No results for input(s): PROBNP in the last 8760 hours.  CBG: No results for input(s): GLUCAP in the last 168 hours.  No results found for this or any previous visit (from the past 240 hour(s)).   Studies: No results found.  Scheduled Meds: . sodium chloride   Intravenous STAT  . enoxaparin (LOVENOX) injection  40 mg Subcutaneous Q24H  . levothyroxine  175 mcg Oral QAC breakfast  . morphine   Intravenous 6 times per day   Continuous Infusions: . 0.9 % NaCl with KCl 20 mEq / L 75 mL/hr at 03/17/15 1610    Time Spent: 25 min   Marinda Elk  [facility] Pager [phone]. If 7PM-7AM, please contact night-coverage at [email or web address], password [facility] 03/17/2015, 10:16 AM  LOS: 1 day

## 2015-03-17 NOTE — Care Management Note (Signed)
Case Management Note  Patient Details  Name: Dana Martin MRN: [id number] Date of Birth: [birth year]  Subjective/Objective:  49 y/o f admitted w/Acute pancreatitis. From home.                  Action/Plan:d/c plan home.   Expected Discharge Date:   (unknown)               Expected Discharge Plan:  Home/Self Care  In-House Referral:     Discharge planning Services  CM Consult  Post Acute Care Choice:    Choice offered to:     DME Arranged:    DME Agency:     HH Arranged:    HH Agency:     Status of Service:  In process, will continue to follow  Medicare Important Message Given:    Date Medicare IM Given:    Medicare IM give by:    Date Additional Medicare IM Given:    Additional Medicare Important Message give by:     If discussed at Long Length of Stay Meetings, dates discussed:    Additional Comments:  Lanier Clam, RN 03/17/2015, 1:15 PM

## 2015-03-18 ENCOUNTER — Inpatient Hospital Stay (HOSPITAL_COMMUNITY): Payer: BLUE CROSS/BLUE SHIELD

## 2015-03-18 DIAGNOSIS — D72829 Elevated white blood cell count, unspecified: Secondary | ICD-10-CM

## 2015-03-18 DIAGNOSIS — E038 Other specified hypothyroidism: Secondary | ICD-10-CM

## 2015-03-18 DIAGNOSIS — K859 Acute pancreatitis, unspecified: Principal | ICD-10-CM

## 2015-03-18 LAB — BASIC METABOLIC PANEL
Anion gap: 8 (ref 5–15)
BUN: 7 mg/dL (ref 6–20)
CALCIUM: 8.6 mg/dL — AB (ref 8.9–10.3)
CO2: 29 mmol/L (ref 22–32)
CREATININE: 0.45 mg/dL (ref 0.44–1.00)
Chloride: 98 mmol/L — ABNORMAL LOW (ref 101–111)
GFR calc non Af Amer: >60 mL/min (ref >60)
Glucose, Bld: 104 mg/dL — ABNORMAL HIGH (ref 65–99)
Potassium: 3.3 mmol/L — ABNORMAL LOW (ref 3.5–5.1)
SODIUM: 135 mmol/L (ref 135–145)

## 2015-03-18 LAB — RHEUMATOID FACTOR: RHEUMATOID FACTOR: 29.3 [IU]/mL — AB (ref 0.0–13.9)

## 2015-03-18 MED ORDER — DIPHENHYDRAMINE HCL 50 MG/ML IJ SOLN: 50.0000 mg | Freq: Four times a day (QID) | INTRAMUSCULAR | Status: DC | PRN

## 2015-03-18 MED ORDER — POTASSIUM CHLORIDE CRYS ER 20 MEQ PO TBCR: 40.0000 meq | EXTENDED_RELEASE_TABLET | Freq: Once | ORAL | Status: DC

## 2015-03-18 MED ORDER — GADOBENATE DIMEGLUMINE 529 MG/ML IV SOLN
20.0000 mL | Freq: Once | INTRAVENOUS | Status: AC | PRN
  Administered 2015-03-18: 19 mL via INTRAVENOUS

## 2015-03-18 MED ORDER — ACETAMINOPHEN 650 MG RE SUPP
650.0000 mg | Freq: Once | RECTAL | Status: DC
  Filled 2015-03-18: qty 1

## 2015-03-18 MED ORDER — POTASSIUM CHLORIDE 20 MEQ/15ML (10%) PO SOLN
40.0000 meq | Freq: Once | ORAL | Status: AC
Start: 2015-03-18 — End: 2015-03-18
  Administered 2015-03-18: 40 meq via ORAL
  Filled 2015-03-18: qty 30

## 2015-03-18 NOTE — Progress Notes (Signed)
Called to MRI. Pt C/o itching and lips edematous post contrast infusion.Pt was able to complete test. Pt states she feels like her throat is swelling. Voice is strong and respiration unlabored, back and arms and chest with hives. Dr Butler Denmark called. Order noted and benadryl given by Rapid response Rn Judeth Cornfield. Pt stated itching relieved with 5 minutes. Patient returned to 1526 .

## 2015-03-18 NOTE — Consult Note (Signed)
[facility] Gastroenterology Consultation Note  Referring Provider:  Dr. Marinda Elk [facility]) Primary Care Physician:  Doris Cheadle, MD Primary Gastroenterologist:  Dr. Willis Modena  Reason for Consultation:  Recurrent pancreatitis  HPI: Dana Martin is a 49 y.o. female whom we've been asked to see for recurrent pancreatitis.  Patient had bout of pancreatitis in 2012 as well one year ago, had one year ago EUS showing sludge in gallbladder but no choledocholithiasis and soon thereafter underwent cholecystectomy.  She did well for the past one year, until one week ago.  At that time, she began having intermittent but progressive post-prandial burning epigastric pain.  Had some nausea and vomiting.  No fevers.  Loose stools post-cholecystectomy but no recent interval change.  No blood in stool.  No weight loss.  No new medications.  Drinks alcohol sporadically, had more than her chronic baseline the weekend before she developed symptoms.  Went to ED with diagnosis of pancreatitis, sent home, returned as she couldn't manage pain on outpatient basis.  She had CT scan showing edema at head of pancreas.  LFTs normal.  Triglycerides one hear ago normal.     Past Medical History  Diagnosis Date  . GERD (gastroesophageal reflux disease)   . Hypothyroidism   . COPD (chronic obstructive pulmonary disease)     denies SOB with daily activities  . History of pancreatitis   . Symptomatic cholelithiasis 05/2014  . Full dentures   . Nasal sinus congestion 05/08/2014    with sinus drainage  . Stress incontinence     Past Surgical History  Procedure Laterality Date  . Tubal ligation    . Eus N/A 02/19/2014    Procedure: ESOPHAGEAL ENDOSCOPIC ULTRASOUND (EUS) RADIAL;  Surgeon: Willis Modena, MD;  Location: [facility];  Service: Endoscopy;  Laterality: N/A;  . Vaginal hysterectomy  11/13/2001    transvaginal   . Breast lumpectomy    . Cholecystectomy N/A 05/13/2014    Procedure: LAPAROSCOPIC  CHOLECYSTECTOMY WITH INTRAOPERATIVE CHOLANGIOGRAM;  Surgeon: Abigail Miyamoto, MD;  Location: [facility];  Service: General;  Laterality: N/A;    Prior to Admission medications   Medication Sig Start Date End Date Taking? Authorizing Provider  diphenhydrAMINE (BENADRYL) 25 MG tablet Take 12.5 mg by mouth daily as needed for itching (allergic reaction).   Yes Historical Provider, MD  Diphenhydramine-Zinc Acetate (BENADRYL EX) Apply 1 application topically daily as needed (rash).   Yes Historical Provider, MD  ibuprofen (ADVIL,MOTRIN) 200 MG tablet Take 800 mg by mouth every 8 (eight) hours as needed (pain).   Yes Historical Provider, MD  ibuprofen (ADVIL,MOTRIN) 800 MG tablet Take 1 tablet (800 mg total) by mouth 3 (three) times daily. 03/14/15  Yes Teressa Lower, NP  lansoprazole (PREVACID) 15 MG capsule Take 15 mg by mouth daily.    Yes Historical Provider, MD  levothyroxine (SYNTHROID) 175 MCG tablet TAKE 1 TABLET (175 MCG TOTAL) BY MOUTH DAILY BEFORE BREAKFAST. Patient taking differently: Take 175 mcg by mouth daily before breakfast.  02/19/15  Yes Doris Cheadle, MD  oxyCODONE-acetaminophen (PERCOCET/ROXICET) 5-325 MG per tablet Take 1 tablet by mouth every 6 (six) hours as needed for severe pain. Patient taking differently: Take 0.5 tablets by mouth every 6 (six) hours as needed for severe pain.  03/14/15  Yes Teressa Lower, NP  Polyethyl Glycol-Propyl Glycol (SYSTANE ULTRA) 0.4-0.3 % SOLN Place 1 drop into both eyes 5 (five) times daily as needed (dry eyes/irriation).   Yes Historical Provider, MD  levocetirizine (XYZAL) 5 MG  tablet Take 1 tablet (5 mg total) by mouth every evening. Patient not taking: Reported on 03/14/2015 05/01/14   Doris Cheadle, MD    Current Facility-Administered Medications  Medication Dose Route Frequency Provider Last Rate Last Dose  . acetaminophen (TYLENOL) suppository 650 mg  650 mg Rectal Once Leda Gauze, NP   650 mg at 03/18/15 0410  .  enoxaparin (LOVENOX) injection 40 mg  40 mg Subcutaneous Q24H Marinda Elk, MD   40 mg at 03/16/15 1953  . levothyroxine (SYNTHROID, LEVOTHROID) tablet 175 mcg  175 mcg Oral QAC breakfast Marinda Elk, MD   175 mcg at 03/18/15 [phone]  . morphine 2 MG/ML injection 2 mg  2 mg Intravenous Q4H PRN Marinda Elk, MD   2 mg at 03/16/15 1900  . ondansetron (ZOFRAN) tablet 4 mg  4 mg Oral Q6H PRN Marinda Elk, MD       Or  . ondansetron [facility]) injection 4 mg  4 mg Intravenous Q6H PRN Marinda Elk, MD      . polyethylene glycol [facility]) packet 17 g  17 g Oral Daily PRN Marinda Elk, MD        Allergies as of 03/16/2015 - Review Complete 03/16/2015  Allergen Reaction Noted  . Fish allergy Hives 02/19/2014  . Omnipaque [iohexol] Hives 12/06/2013  . Other Nausea And Vomiting 05/08/2013  . Peanut-containing drug products Hives 02/14/2014  . Shellfish allergy Hives 02/14/2014    Family History  Problem Relation Age of Onset  . Hypertension Mother   . Hypertension Sister   . Diabetes Sister   . Hypertension Brother     Social History   Social History  . Marital Status: Married    Spouse Name: N/A  . Number of Children: N/A  . Years of Education: N/A   Occupational History  . Not on file.   Social History Main Topics  . Smoking status: Former Smoker    Quit date: 05/08/2013  . Smokeless tobacco: Never Used  . Alcohol Use: Yes     Comment: rarely  . Drug Use: No  . Sexual Activity: Not Currently   Other Topics Concern  . Not on file   Social History Narrative    Review of Systems: ROS Dr. Robb Matar 03/16/15 reviewed and I agree  Physical Exam: Vital signs in last 24 hours: Temp:  [98.1 F (36.7 C)-98.8 F (37.1 C)] 98.1 F (36.7 C) (08/10 0948) Pulse Rate:  [83-92] 88 (08/10 0948) Resp:  [11-20] 14 (08/10 0948) BP: (107-151)/(74-90) 107/81 mmHg (08/10 0948) SpO2:  [97 %-100 %] 97 % (08/10 0948)   General:   Alert,  overweight, well-developed, well-nourished, pleasant and cooperative in NAD Head:  Normocephalic and atraumatic. Eyes:  Somewhat prominent and protuberant eyes; Sclera clear, no icterus.   Conjunctiva pink. Ears:  Normal auditory acuity. Nose:  No deformity, discharge,  or lesions. Mouth:  No deformity or lesions.  Oropharynx pink but dry. Neck:  Supple; no masses or thyromegaly. Abdomen:  Soft, mild epigastric tenderness without peritonitis.  No masses, hepatosplenomegaly or hernias noted. No guarding, and without rebound.     Msk:  Symmetrical without gross deformities. Normal posture. Pulses:  Normal pulses noted. Extremities:  Without clubbing or edema. Neurologic:  Alert and  oriented x4;  grossly normal neurologically. Skin:  Occasional scattered ecchymoses. Otherwise intact without significant lesions or rashes. Psych:  Alert and cooperative. Normal mood and affect.   Lab Results:  Recent Labs  03/16/15 1208 03/17/15 0600  WBC 22.7* 19.9*  HGB 14.1 12.3  HCT 43.7 37.7  PLT 229 207   BMET  Recent Labs  03/16/15 1208 03/17/15 0600 03/18/15 0503  NA 136 135 135  K 3.5 3.5 3.3*  CL 98* 101 98*  CO2 30 26 29   GLUCOSE 109* 87 104*  BUN 12 8 7   CREATININE 0.60 0.38* 0.45  CALCIUM 9.3 8.5* 8.6*   LFT  Recent Labs  03/16/15 1208  PROT 9.1*  ALBUMIN 4.1  AST 16  ALT 14  ALKPHOS 87  BILITOT 0.6   PT/INR No results for input(s): LABPROT, INR in the last 72 hours.  Studies/Results: No results found.  Impression:  1.  Recurrent pancreatitis.  Cholecystectomy done one year ago; EUS at that time showed no bile duct stones and her LFTs now are normal, doubt biliary tract (i.e., choledocholithiasis) etiology.  Alcohol a possibility, but again her LFTs were normal and I would expect a bit more alcohol intake to require her pancreatitis, though she did drink a little more alcohol than usual the weekend before her onset of symptoms.  No clear medication etiology.  No  trauma etiology.  Possibilities also include ductal anomaly (pancreas divisum > annular pancreas) and autoimmune pancreatitis. 2.  Epigastric abdominal pain, likely from #1 above. 3.  Abnormal CT scan abdomen, pancreatic head edema, likely from #1 above.  Plan:  1.  Clears for now, possibly advance tomorrow as tolerated. 2.  Intravenous fluids and judicious analgesics as medical management for her pancreatitis. 3.  MRI/MRCP. 4.  Labs:  ANA, IgG4, triglyceride level. 5.  Would probably avoid alcohol intake moving forward. 6.  Over 50 no prior colonoscopy; [place] screening colonoscopy [place] as outpatient. 7.  [facility] will follow.   LOS: 2 days   Pedrohenrique Mcconville M  03/18/2015, 1:45 PM  Pager [phone] If no answer or after 5 PM call [phone]

## 2015-03-18 NOTE — Progress Notes (Signed)
Rapid Response Event Note  Overview:  Called to MRI for patient c/o itching lips and throat after MRI contrast administration  Initial Focused Assessment:  Patient sitting in wheelchair with RN.  Patient awake, alert, oriented x4, c/o slight itching and burning in lips and throat, that is "getting better".  Patient also c/o itching on arms.  No obvious distress.  Interventions:  Patient given Benadryl  IV per MD order.  Transported back to patient room by wheelchair by patient's RN.    Event Summary:    Bonnielee Haff

## 2015-03-18 NOTE — Progress Notes (Addendum)
[facility] Progress Note   Dana Martin ZOX:[id number] DOB: 01-22-1966 DOA: 03/16/2015 PCP: Doris Cheadle, MD  Brief narrative: Dana Martin is a 49 y.o. female with gastroscopic reflux disease, hypothyroidism, history of pancreatitis in the past who presents to the hospital with recurrent pancreatitis with severe abdominal pain. She has been having acute pancreatitis episodes since 2012.  CT scan reveals inflammation of the head of the pancreas. The patient drinks occasionally. Triglycerides have been negative in the past. She states she had her gallbladder removed to prevent further episodes of pancreatitis and this is the first episode since cholecystectomy which was done on 05/13/14.   Subjective: Abdominal pain has improved significantly. No nausea.  Assessment/Plan: Active Problems:   Acute pancreatitis - Symptoms improving-we'll start clear liquids- -continue IV fluids as well -DC PCA and continue on when necessary narcotics - GI consulted for further recommendations - MRCP, ANA, IgG 4 and lipid panel ordered by GI  Hypokalemia -Continue to replace    Leukocytosis - slightly improved- no other signs of infection- likely neutrophilic demargination  Hypothyroidism -Continue Synthroid    Appt with PCP: Requested Code Status: Full code Family Communication:  Disposition Plan: Follow-up on GI workup DVT prophylaxis: Lovenox Consultants: GI Procedures:  Antibiotics: Anti-infectives    None      Objective: Filed Weights   03/16/15 1123  Weight: 92.987 kg (205 lb)    Intake/Output Summary (Last 24 hours) at 03/18/15 1456 Last data filed at 03/18/15 1409  Gross per 24 hour  Intake 2895.83 ml  Output   1350 ml  Net 1545.83 ml     Vitals Filed Vitals:   03/18/15 0411 03/18/15 0554 03/18/15 0948 03/18/15 1409  BP:  128/80 107/81 134/85  Pulse:  83 88 90  Temp:  98.5 F (36.9 C) 98.1 F (36.7 C) 97.7 F (36.5 C)  TempSrc:  Oral Oral Oral   Resp: 13 14 14 14   Height:      Weight:      SpO2: 99% 100% 97% 96%    Exam:  General:  Pt is alert, not in acute distress  HEENT: No icterus, No thrush, oral mucosa moist  Cardiovascular: regular rate and rhythm, S1/S2 No murmur  Respiratory: clear to auscultation bilaterally   Abdomen: Soft, +Bowel sounds, mildly tender in epigastrium, non distended, no guarding  MSK: No LE edema, cyanosis or clubbing  Data Reviewed: Basic Metabolic Panel:  Recent Labs Lab 03/12/15 1021 03/14/15 1152 03/16/15 1208 03/17/15 0600 03/18/15 0503  NA 138 134* 136 135 135  K 4.6 3.8 3.5 3.5 3.3*  CL 100 101 98* 101 98*  CO2 29 27 30 26 29   GLUCOSE 83 88 109* 87 104*  BUN 7 8 12 8 7   CREATININE 0.60 0.54 0.60 0.38* 0.45  CALCIUM 9.0 8.6* 9.3 8.5* 8.6*   Liver Function Tests:  Recent Labs Lab 03/12/15 1021 03/14/15 1152 03/16/15 1208  AST 13 15 16   ALT 12 13* 14  ALKPHOS 85 72 87  BILITOT 0.5 0.4 0.6  PROT 7.4 7.2 9.1*  ALBUMIN 4.0 3.6 4.1    Recent Labs Lab 03/14/15 1152 03/16/15 1208  LIPASE 80* 21*   No results for input(s): AMMONIA in the last 168 hours. CBC:  Recent Labs Lab 03/12/15 1021 03/14/15 1152 03/16/15 1208 03/17/15 0600  WBC 12.6* 13.4* 22.7* 19.9*  NEUTROABS 9.2* 10.1*  --   --   HGB 13.5 12.6 14.1 12.3  HCT 41.0 38.5 43.7 37.7  MCV 82.2  84.6 84.0 85.1  PLT 173 158 229 207   Cardiac Enzymes: No results for input(s): CKTOTAL, CKMB, CKMBINDEX, TROPONINI in the last 168 hours. BNP (last 3 results) No results for input(s): BNP in the last 8760 hours.  ProBNP (last 3 results) No results for input(s): PROBNP in the last 8760 hours.  CBG: No results for input(s): GLUCAP in the last 168 hours.  Recent Results (from the past 240 hour(s))  Culture, blood (routine x 2)     Status: None (Preliminary result)   Collection Time: 03/16/15  7:34 PM  Result Value Ref Range Status   Specimen Description BLOOD RIGHT ANTECUBITAL  Final   Special  Requests BOTTLES DRAWN AEROBIC ONLY  Final   Culture   Final    NO GROWTH < 24 HOURS Performed at [facility]    Report Status PENDING  Incomplete  Culture, blood (routine x 2)     Status: None (Preliminary result)   Collection Time: 03/16/15  7:35 PM  Result Value Ref Range Status   Specimen Description BLOOD RIGHT HAND  Final   Special Requests IN PEDIATRIC BOTTLE 2.5ML  Final   Culture   Final    NO GROWTH < 24 HOURS Performed at [facility]    Report Status PENDING  Incomplete     Studies: No results found.  Scheduled Meds:  Scheduled Meds: . acetaminophen  650 mg Rectal Once  . enoxaparin (LOVENOX) injection  40 mg Subcutaneous Q24H  . levothyroxine  175 mcg Oral QAC breakfast   Continuous Infusions:   Time spent on care of this patient: 35 min   Robertt Buda, MD 03/18/2015, 2:56 PM  LOS: 2 days   [facility] Office  [phone] Pager - Text Page per [email or web address] If 7PM-7AM, please contact night-coverage [email or web address]

## 2015-03-19 ENCOUNTER — Inpatient Hospital Stay (HOSPITAL_COMMUNITY): Payer: BLUE CROSS/BLUE SHIELD

## 2015-03-19 LAB — LIPID PANEL
CHOL/HDL RATIO: 5.4 ratio
Cholesterol: 185 mg/dL (ref 0–200)
HDL: 34 mg/dL — AB (ref >40)
LDL CALC: 127 mg/dL — AB (ref 0–99)
Triglycerides: 119 mg/dL (ref <150)
VLDL: 24 mg/dL (ref 0–40)

## 2015-03-19 LAB — BASIC METABOLIC PANEL
ANION GAP: 8 (ref 5–15)
BUN: 8 mg/dL (ref 6–20)
CALCIUM: 9 mg/dL (ref 8.9–10.3)
CO2: 29 mmol/L (ref 22–32)
Chloride: 98 mmol/L — ABNORMAL LOW (ref 101–111)
Creatinine, Ser: 0.52 mg/dL (ref 0.44–1.00)
GFR calc Af Amer: >60 mL/min (ref >60)
GFR calc non Af Amer: >60 mL/min (ref >60)
GLUCOSE: 95 mg/dL (ref 65–99)
Potassium: 3.7 mmol/L (ref 3.5–5.1)
Sodium: 135 mmol/L (ref 135–145)

## 2015-03-19 NOTE — Progress Notes (Signed)
Patient ID: Dana Martin, female   DOB: 07/22/1966, 49 y.o.   MRN: [id number] [facility] Progress Note  Dana Martin 49 y.o. Sep 17, 1965   Subjective: Feels better. Abdomen feels "tender" but less intense. Denies N/V. Hungry.  Objective: Vital signs in last 24 hours: Filed Vitals:   03/19/15 0534  BP: 120/90  Pulse: 89  Temp: 98.1 F (36.7 C)  Resp: 17    Physical Exam: Gen: alert, no acute distress CV: RRR Chest: CTA B Abd: RUQ and epigastric tenderness with guarding, soft, nondistended, +BS Ext: pulses intact, no edema  Lab Results:  Recent Labs  03/18/15 0503 03/19/15 0539  NA 135 135  K 3.3* 3.7  CL 98* 98*  CO2 29 29  GLUCOSE 104* 95  BUN 7 8  CREATININE 0.45 0.52  CALCIUM 8.6* 9.0    Recent Labs  03/16/15 1208  AST 16  ALT 14  ALKPHOS 87  BILITOT 0.6  PROT 9.1*  ALBUMIN 4.1    Recent Labs  03/16/15 1208 03/17/15 0600  WBC 22.7* 19.9*  HGB 14.1 12.3  HCT 43.7 37.7  MCV 84.0 85.1  PLT 229 207   No results for input(s): LABPROT, INR in the last 72 hours. 1. Mildly motion degraded exam. 2. Moderate pancreatitis involving the head and uncinate process. Foci of decreased to absent enhancement within are suspicious for complicating necrosis. 3. No biliary duct dilatation or evidence of choledocholithiasis. 4. Suboptimal evaluation for pancreas divisum, secondary to pancreatic edema and resultant compression of the duct within the head. No pancreatic duct dilatation identified. 5. Bilateral inferior breast "Nodules" are likely intramammary lymph nodes. Consider correlation with mammogram, nonemergent/outpatient.    Assessment/Plan: Pancreatitis with MRCP showing moderate pancreatitis and questioning possible necrosis but limited study. No CBD dilation or CBD stone. Clinically has improved. Will place on low fat soft diet. Likely will need an outpt EUS after resolution of the pancreatitis if autoimmune pancreatitis markers are  negative. Will discuss MRCP with Dr. Dulce Sellar.   Dana Martin C. 03/19/2015, 8:30 AM  Pager [phone]  If no answer or after 5 PM call [phone]

## 2015-03-19 NOTE — Progress Notes (Signed)
Pt refused her night time Lovenox. Patient educated about the importance of medication and risk of dvt's. Patient understood and continues to refuse. Will encourage frequent ambulation, continue to monitor.

## 2015-03-19 NOTE — Progress Notes (Signed)
[facility] Progress Note   Dana Martin ZDG:[id number] DOB: [birth year] DOA: 03/16/2015 PCP: Doris Cheadle, MD  Brief narrative: Dana Martin is a 49 y.o. female with gastroscopic reflux disease, hypothyroidism, history of pancreatitis in the past who presents to the hospital with recurrent pancreatitis with severe abdominal pain. She has been having acute pancreatitis episodes since 2012.  CT scan reveals inflammation of the head of the pancreas. The patient drinks occasionally. Triglycerides have been negative in the past. She states she had her gallbladder removed to prevent further episodes of pancreatitis and this is the first episode since cholecystectomy which was done on 05/13/14.   Subjective: No recurrence of abdominal pain. No nausea. She is having loose stools. No fevers.  Assessment/Plan: Active Problems:   Acute pancreatitis - Symptoms improving-GI has advanced from liquid diet to soft foods today - GI consulted for further recommendations - MRCP performed yesterday-report reviewed-Dr. Dulce Sellar to review report today as well -see report below - ANA, IgG 4 and lipid panel ordered by GI -Hopefully home tomorrow with outpatient GI follow-up  Hypokalemia -Replaced    Leukocytosis - slightly improved when rechecked on 8/10- no other signs of infection- likely neutrophilic demargination  Hypothyroidism -Continue Synthroid    Appt with PCP: Requested Code Status: Full code Family Communication:  Disposition Plan: Follow-up on GI workup DVT prophylaxis: Lovenox Consultants: GI Procedures:  Antibiotics: Anti-infectives    None      Objective: Filed Weights   03/16/15 1123  Weight: 92.987 kg (205 lb)    Intake/Output Summary (Last 24 hours) at 03/19/15 1158 Last data filed at 03/19/15 0600  Gross per 24 hour  Intake    120 ml  Output    400 ml  Net   -280 ml     Vitals Filed Vitals:   03/18/15 0948 03/18/15 1409 03/18/15 2215 03/19/15 0534   BP: 107/81 134/85 145/96 120/90  Pulse: 88 90 91 89  Temp: 98.1 F (36.7 C) 97.7 F (36.5 C) 97 F (36.1 C) 98.1 F (36.7 C)  TempSrc: Oral Oral Oral Oral  Resp: 14 14 16 17   Height:      Weight:      SpO2: 97% 96% 97% 91%    Exam:  General:  Pt is alert, not in acute distress  HEENT: No icterus, No thrush, oral mucosa moist  Cardiovascular: regular rate and rhythm, S1/S2 No murmur  Respiratory: clear to auscultation bilaterally   Abdomen: Soft, +Bowel sounds, mildly tender in epigastrium, non distended, no guarding  MSK: No LE edema, cyanosis or clubbing  Data Reviewed: Basic Metabolic Panel:  Recent Labs Lab 03/14/15 1152 03/16/15 1208 03/17/15 0600 03/18/15 0503 03/19/15 0539  NA 134* 136 135 135 135  K 3.8 3.5 3.5 3.3* 3.7  CL 101 98* 101 98* 98*  CO2 27 30 26 29 29   GLUCOSE 88 109* 87 104* 95  BUN 8 12 8 7 8   CREATININE 0.54 0.60 0.38* 0.45 0.52  CALCIUM 8.6* 9.3 8.5* 8.6* 9.0   Liver Function Tests:  Recent Labs Lab 03/14/15 1152 03/16/15 1208  AST 15 16  ALT 13* 14  ALKPHOS 72 87  BILITOT 0.4 0.6  PROT 7.2 9.1*  ALBUMIN 3.6 4.1    Recent Labs Lab 03/14/15 1152 03/16/15 1208  LIPASE 80* 21*   No results for input(s): AMMONIA in the last 168 hours. CBC:  Recent Labs Lab 03/14/15 1152 03/16/15 1208 03/17/15 0600  WBC 13.4* 22.7* 19.9*  NEUTROABS 10.1*  --   --  HGB 12.6 14.1 12.3  HCT 38.5 43.7 37.7  MCV 84.6 84.0 85.1  PLT 158 229 207   Cardiac Enzymes: No results for input(s): CKTOTAL, CKMB, CKMBINDEX, TROPONINI in the last 168 hours. BNP (last 3 results) No results for input(s): BNP in the last 8760 hours.  ProBNP (last 3 results) No results for input(s): PROBNP in the last 8760 hours.  CBG: No results for input(s): GLUCAP in the last 168 hours.  Recent Results (from the past 240 hour(s))  Culture, blood (routine x 2)     Status: None (Preliminary result)   Collection Time: 03/16/15  7:34 PM  Result Value Ref  Range Status   Specimen Description BLOOD RIGHT ANTECUBITAL  Final   Special Requests BOTTLES DRAWN AEROBIC ONLY  Final   Culture   Final    NO GROWTH 3 DAYS Performed at [facility]    Report Status PENDING  Incomplete  Culture, blood (routine x 2)     Status: None (Preliminary result)   Collection Time: 03/16/15  7:35 PM  Result Value Ref Range Status   Specimen Description BLOOD RIGHT HAND  Final   Special Requests IN PEDIATRIC BOTTLE 2.5ML  Final   Culture   Final    NO GROWTH 3 DAYS Performed at [facility]    Report Status PENDING  Incomplete     Studies: Mr 3d Recon At Scanner  03-27-2015   CLINICAL DATA:  Recurrent pancreatitis, status post cholecystectomy. Evaluate for common duct stone. Evaluate for pancreas divisum.  EXAM: MRI ABDOMEN WITHOUT AND WITH CONTRAST (INCLUDING MRCP)  TECHNIQUE: Multiplanar multisequence MR imaging of the abdomen was performed both before and after the administration of intravenous contrast. Heavily T2-weighted images of the biliary and pancreatic ducts were obtained, and three-dimensional MRCP images were rendered by post processing.  CONTRAST:  19mL MULTIHANCE GADOBENATE DIMEGLUMINE 529 MG/ML IV SOLN  COMPARISON:  03/14/2015 CT.  No prior MRI  FINDINGS: Portions of exam are mildly motion degraded.  Lower chest: Left larger than right lower breast nodules. Example 1.3 cm on the left on image 1 of series 1402. Normal heart size without pericardial or pleural effusion.  Hepatobiliary: Multiple hepatic cysts, including a central right hepatic lobe 3.5 cm lesion. Cholecystectomy. No intra or extrahepatic biliary duct dilatation. No evidence of choledocholithiasis.  Pancreas: Moderate inflammation within and surrounding the pancreatic head and uncinate process. There is no pancreatic duct dilatation. The pancreatic duct is presumably decompressed by edematous parenchyma within the head. This makes evaluation for and exclusion of pancreas  divisum impossible. Postcontrast areas of decreased to absent pancreatic enhancement within the head and uncinate process. The largest measures 2.1 cm on image 82 of series 1400 and 3. No peripancreatic fluid collection.  Spleen: Normal  Adrenals/Urinary Tract: Normal adrenal glands. Bilateral renal cysts. No hydronephrosis.  Stomach/Bowel: Normal stomach, without wall thickening. There is mild secondary edema involving the descending duodenum. Large and small bowel loops are otherwise unremarkable.  Vascular/Lymphatic: Normal caliber of the aorta and branch vessels. Separate origins of the splenic and common hepatic arteries. No splenic artery aneurysm. No venous thrombosis. No retroperitoneal or retrocrural adenopathy.  Other: No ascites.  Musculoskeletal: No acute osseous abnormality.  IMPRESSION: 1. Mildly motion degraded exam. 2. Moderate pancreatitis involving the head and uncinate process. Foci of decreased to absent enhancement within are suspicious for complicating necrosis. 3. No biliary duct dilatation or evidence of choledocholithiasis. 4. Suboptimal evaluation for pancreas divisum, secondary to pancreatic edema and resultant  compression of the duct within the head. No pancreatic duct dilatation identified. 5. Bilateral inferior breast "Nodules" are likely intramammary lymph nodes. Consider correlation with mammogram, nonemergent/outpatient.   Electronically Signed   By: Jeronimo Greaves M.D.   On: 03/18/2015 19:29   Mr Abd W/wo Cm/mrcp  03/18/2015   CLINICAL DATA:  Recurrent pancreatitis, status post cholecystectomy. Evaluate for common duct stone. Evaluate for pancreas divisum.  EXAM: MRI ABDOMEN WITHOUT AND WITH CONTRAST (INCLUDING MRCP)  TECHNIQUE: Multiplanar multisequence MR imaging of the abdomen was performed both before and after the administration of intravenous contrast. Heavily T2-weighted images of the biliary and pancreatic ducts were obtained, and three-dimensional MRCP images were rendered  by post processing.  CONTRAST:  19mL MULTIHANCE GADOBENATE DIMEGLUMINE 529 MG/ML IV SOLN  COMPARISON:  03/14/2015 CT.  No prior MRI  FINDINGS: Portions of exam are mildly motion degraded.  Lower chest: Left larger than right lower breast nodules. Example 1.3 cm on the left on image 1 of series 1402. Normal heart size without pericardial or pleural effusion.  Hepatobiliary: Multiple hepatic cysts, including a central right hepatic lobe 3.5 cm lesion. Cholecystectomy. No intra or extrahepatic biliary duct dilatation. No evidence of choledocholithiasis.  Pancreas: Moderate inflammation within and surrounding the pancreatic head and uncinate process. There is no pancreatic duct dilatation. The pancreatic duct is presumably decompressed by edematous parenchyma within the head. This makes evaluation for and exclusion of pancreas divisum impossible. Postcontrast areas of decreased to absent pancreatic enhancement within the head and uncinate process. The largest measures 2.1 cm on image 82 of series 1400 and 3. No peripancreatic fluid collection.  Spleen: Normal  Adrenals/Urinary Tract: Normal adrenal glands. Bilateral renal cysts. No hydronephrosis.  Stomach/Bowel: Normal stomach, without wall thickening. There is mild secondary edema involving the descending duodenum. Large and small bowel loops are otherwise unremarkable.  Vascular/Lymphatic: Normal caliber of the aorta and branch vessels. Separate origins of the splenic and common hepatic arteries. No splenic artery aneurysm. No venous thrombosis. No retroperitoneal or retrocrural adenopathy.  Other: No ascites.  Musculoskeletal: No acute osseous abnormality.  IMPRESSION: 1. Mildly motion degraded exam. 2. Moderate pancreatitis involving the head and uncinate process. Foci of decreased to absent enhancement within are suspicious for complicating necrosis. 3. No biliary duct dilatation or evidence of choledocholithiasis. 4. Suboptimal evaluation for pancreas divisum,  secondary to pancreatic edema and resultant compression of the duct within the head. No pancreatic duct dilatation identified. 5. Bilateral inferior breast "Nodules" are likely intramammary lymph nodes. Consider correlation with mammogram, nonemergent/outpatient.   Electronically Signed   By: Jeronimo Greaves M.D.   On: 03/18/2015 19:29    Scheduled Meds:  Scheduled Meds: . acetaminophen  650 mg Rectal Once  . enoxaparin (LOVENOX) injection  40 mg Subcutaneous Q24H  . levothyroxine  175 mcg Oral QAC breakfast   Continuous Infusions:   Time spent on care of this patient: 35 min   Eymi Lipuma, MD 03/19/2015, 11:58 AM  LOS: 3 days   [facility] Office  [phone] Pager - Text Page per [email or web address] If 7PM-7AM, please contact night-coverage [email or web address]

## 2015-03-20 ENCOUNTER — Ambulatory Visit (HOSPITAL_COMMUNITY): Admission: RE | Admit: 2015-03-20 | Payer: BLUE CROSS/BLUE SHIELD | Source: Ambulatory Visit

## 2015-03-20 ENCOUNTER — Telehealth: Payer: Self-pay | Admitting: Internal Medicine

## 2015-03-20 DIAGNOSIS — K858 Other acute pancreatitis: Secondary | ICD-10-CM

## 2015-03-20 DIAGNOSIS — E039 Hypothyroidism, unspecified: Secondary | ICD-10-CM

## 2015-03-20 DIAGNOSIS — E876 Hypokalemia: Secondary | ICD-10-CM

## 2015-03-20 LAB — IGG: IgG (Immunoglobin G), Serum: 1276 mg/dL (ref 700–1600)

## 2015-03-20 LAB — CBC
HEMATOCRIT: 37.9 % (ref 36.0–46.0)
Hemoglobin: 11.9 g/dL — ABNORMAL LOW (ref 12.0–15.0)
MCH: 26.2 pg (ref 26.0–34.0)
MCHC: 31.4 g/dL (ref 30.0–36.0)
MCV: 83.5 fL (ref 78.0–100.0)
PLATELETS: 279 10*3/uL (ref 150–400)
RBC: 4.54 MIL/uL (ref 3.87–5.11)
RDW: 15.2 % (ref 11.5–15.5)
WBC: 10.1 10*3/uL (ref 4.0–10.5)

## 2015-03-20 LAB — IGG 4: IgG, Subclass 4: 55 mg/dL (ref 1–291)

## 2015-03-20 LAB — C DIFFICILE QUICK SCREEN W PCR REFLEX
C Diff antigen: NEGATIVE
C Diff interpretation: NEGATIVE
C Diff toxin: NEGATIVE

## 2015-03-20 LAB — ANTINUCLEAR ANTIBODIES, IFA: ANA Ab, IFA: NEGATIVE

## 2015-03-20 MED ORDER — OXYCODONE-ACETAMINOPHEN 5-325 MG PO TABS: 0.5000 | ORAL_TABLET | Freq: Four times a day (QID) | ORAL | Status: DC | PRN

## 2015-03-20 NOTE — Discharge Summary (Signed)
Physician Discharge Summary  Dana Martin GNF:[id number] DOB: [birth year] DOA: 03/16/2015  PCP: Doris Cheadle, MD  Admit date: 03/16/2015 Discharge date: 03/20/2015  Time spent: 45 minutes  Recommendations for Outpatient Follow-up:  1. Follow-up with Dr. Dulce Sellar  Discharge Condition: Stable Diet recommendation: low fat diet, small meals  Discharge Diagnoses:  Active Problems:   Acute pancreatitis   Leukocytosis  hypokalemia Hypothyroidism  History of present illness:  Dana Martin is a 49 y.o. female with gastroscopic reflux disease, hypothyroidism, history of pancreatitis in the past who presents to the hospital with recurrent pancreatitis with severe abdominal pain. She has been having acute pancreatitis episodes since 2012. CT scan reveals inflammation of the head of the pancreas. The patient drinks occasionally. Triglycerides have been negative in the past. She states she had her gallbladder removed to prevent further episodes of pancreatitis and this is the first episode since cholecystectomy which was done on 05/13/14.   Hospital Course:  Active Problems:  Acute pancreatitis - Symptoms improving-GI has advanced from liquid diet to soft foods today - GI consulted for further recommendations - MRCP performed yesterday-report reviewed - ANA, IgG 4 and lipid panel ordered by GI  --Dr. Bosie Clos has dated that he will ask Dr. Dulce Sellar to review the MRCP and has recommended the patient follow up with Dr. Dulce Sellar as outpatient to decide on further treatment plans -she has been recommended to stop drinking alcohol completely  Hypokalemia -Replaced   Leukocytosis - Resolved -  no other signs of infection- likely neutrophilic demargination versus acute inflammation of the pancreas  Hypothyroidism -Continue Synthroid    Consultations:  GI consult  Discharge Exam: Filed Weights   03/16/15 1123  Weight: 92.987 kg (205 lb)   Filed Vitals:   03/20/15 0514  BP: 110/73   Pulse: 86  Temp: 98.2 F (36.8 C)  Resp: 16    General: AAO x 3, no distress Cardiovascular: RRR, no murmurs  Respiratory: clear to auscultation bilaterally GI: soft, non-tender, non-distended, bowel sound positive  Discharge Instructions You were cared for by a hospitalist during your hospital stay. If you have any questions about your discharge medications or the care you received while you were in the hospital after you are discharged, you can call the unit and asked to speak with the hospitalist on call if the hospitalist that took care of you is not available. Once you are discharged, your primary care physician will handle any further medical issues. Please note that NO REFILLS for any discharge medications will be authorized once you are discharged, as it is imperative that you return to your primary care physician (or establish a relationship with a primary care physician if you do not have one) for your aftercare needs so that they can reassess your need for medications and monitor your lab values.  Discharge Instructions    Discharge instructions    Complete by:  As directed   Low fat diet, small meals     Increase activity slowly    Complete by:  As directed             Medication List    STOP taking these medications        ibuprofen 200 MG tablet  Commonly known as:  ADVIL,MOTRIN     ibuprofen 800 MG tablet  Commonly known as:  ADVIL,MOTRIN      TAKE these medications        BENADRYL EX  Apply 1 application topically daily as needed (rash).  diphenhydrAMINE 25 MG tablet  Commonly known as:  BENADRYL  Take 12.5 mg by mouth daily as needed for itching (allergic reaction).     lansoprazole 15 MG capsule  Commonly known as:  PREVACID  Take 15 mg by mouth daily.     levothyroxine 175 MCG tablet  Commonly known as:  SYNTHROID  TAKE 1 TABLET (175 MCG TOTAL) BY MOUTH DAILY BEFORE BREAKFAST.     oxyCODONE-acetaminophen 5-325 MG per tablet  Commonly known  as:  PERCOCET/ROXICET  Take 0.5 tablets by mouth every 6 (six) hours as needed for severe pain.     SYSTANE ULTRA 0.4-0.3 % Soln  Generic drug:  Polyethyl Glycol-Propyl Glycol  Place 1 drop into both eyes 5 (five) times daily as needed (dry eyes/irriation).      ASK your doctor about these medications        levocetirizine 5 MG tablet  Commonly known as:  XYZAL  Take 1 tablet (5 mg total) by mouth every evening.       Allergies  Allergen Reactions  . Fish Allergy Hives  . Gadolinium Derivatives Hives, Itching and Rash    Pt stated she felt itching and burning in her throat immediately following the injection.  . Multihance [Gadobenate] Hives, Itching and Rash    Itching and burning in her throat. Hives and itching of her skin.  . Omnipaque [Iohexol] Hives  . Other Nausea And Vomiting    ALL NARCOTICS  . Peanut-Containing Drug Products Hives  . Shellfish Allergy Hives       Follow-up Information    Call Freddy Jaksch, MD.   Specialty:  Gastroenterology   Why:  for follow up appt   Contact information:   [address] [place] [address] [phone]        The results of significant diagnostics from this hospitalization (including imaging, microbiology, ancillary and laboratory) are listed below for reference.    Significant Diagnostic Studies: Ct Abdomen Pelvis W Contrast  03/14/2015   CLINICAL DATA:  Right-sided abdominal pain for 3 days, elevated lipase  EXAM: CT ABDOMEN AND PELVIS WITH CONTRAST  TECHNIQUE: Multidetector CT imaging of the abdomen and pelvis was performed using the standard protocol following bolus administration of intravenous contrast.  CONTRAST:  OMNIPAQUE IOHEXOL 300 MG/ML  SOLN  COMPARISON:  12/06/2013  FINDINGS: Lower chest:  Clear  Hepatobiliary: 3 cm right hepatic cysts stable. Gallbladder surgically absent. 4 mm cyst inferior tip of the liver on the right.  Pancreas: Enlarged of the pancreatic head with mild  surrounding inflammatory change and heterogeneous attenuation of the pancreatic head. Associated wall thickening of the medial edge of the adjacent duodenum. No evidence of pseudocyst.  Spleen: Normal  Adrenals/Urinary Tract: Peripelvic cyst lower pole left kidney measuring a cm. 1 cm cyst lower pole right kidney. 7 mm cyst upper pole right kidney. 2 mm nonobstructing upper pole right renal stone. Bladder normal.  Stomach/Bowel: Significant diverticulosis of the sigmoid colon and to a lesser degree descending colon. Asymmetric thickening involving the cecum. Appendix normal. Stomach small bowel normal except for duodenum as described above.  Vascular/Lymphatic: Mild calcification of the aortoiliac vessels  Reproductive: Uterus appears to be absent. Right ovary decreased in size when compared to prior study now measuring 29 x 24 mm. 1 cm focus of low attenuation likely represents ovarian cyst or follicle. Similar appearance to left ovary.  Other: No additional findings  Musculoskeletal: No acute abnormalities  IMPRESSION: Findings consistent with acute pancreatitis  Electronically Signed   By: Esperanza Heir M.D.   On: 03/14/2015 15:48   Mr 3d Recon At Scanner  03/18/2015   CLINICAL DATA:  Recurrent pancreatitis, status post cholecystectomy. Evaluate for common duct stone. Evaluate for pancreas divisum.  EXAM: MRI ABDOMEN WITHOUT AND WITH CONTRAST (INCLUDING MRCP)  TECHNIQUE: Multiplanar multisequence MR imaging of the abdomen was performed both before and after the administration of intravenous contrast. Heavily T2-weighted images of the biliary and pancreatic ducts were obtained, and three-dimensional MRCP images were rendered by post processing.  CONTRAST:  19mL MULTIHANCE GADOBENATE DIMEGLUMINE 529 MG/ML IV SOLN  COMPARISON:  03/14/2015 CT.  No prior MRI  FINDINGS: Portions of exam are mildly motion degraded.  Lower chest: Left larger than right lower breast nodules. Example 1.3 cm on the left on image 1 of  series 1402. Normal heart size without pericardial or pleural effusion.  Hepatobiliary: Multiple hepatic cysts, including a central right hepatic lobe 3.5 cm lesion. Cholecystectomy. No intra or extrahepatic biliary duct dilatation. No evidence of choledocholithiasis.  Pancreas: Moderate inflammation within and surrounding the pancreatic head and uncinate process. There is no pancreatic duct dilatation. The pancreatic duct is presumably decompressed by edematous parenchyma within the head. This makes evaluation for and exclusion of pancreas divisum impossible. Postcontrast areas of decreased to absent pancreatic enhancement within the head and uncinate process. The largest measures 2.1 cm on image 82 of series 1400 and 3. No peripancreatic fluid collection.  Spleen: Normal  Adrenals/Urinary Tract: Normal adrenal glands. Bilateral renal cysts. No hydronephrosis.  Stomach/Bowel: Normal stomach, without wall thickening. There is mild secondary edema involving the descending duodenum. Large and small bowel loops are otherwise unremarkable.  Vascular/Lymphatic: Normal caliber of the aorta and branch vessels. Separate origins of the splenic and common hepatic arteries. No splenic artery aneurysm. No venous thrombosis. No retroperitoneal or retrocrural adenopathy.  Other: No ascites.  Musculoskeletal: No acute osseous abnormality.  IMPRESSION: 1. Mildly motion degraded exam. 2. Moderate pancreatitis involving the head and uncinate process. Foci of decreased to absent enhancement within are suspicious for complicating necrosis. 3. No biliary duct dilatation or evidence of choledocholithiasis. 4. Suboptimal evaluation for pancreas divisum, secondary to pancreatic edema and resultant compression of the duct within the head. No pancreatic duct dilatation identified. 5. Bilateral inferior breast "Nodules" are likely intramammary lymph nodes. Consider correlation with mammogram, nonemergent/outpatient.   Electronically Signed    By: Jeronimo Greaves M.D.   On: 03/18/2015 19:29   Dg Abd Portable 1v  03/19/2015   CLINICAL DATA:  Diarrhea.  Abdominal pain.  History of pancreatitis.  EXAM: PORTABLE ABDOMEN - 1 VIEW  COMPARISON:  CT abdomen and pelvis 03/14/2015.  FINDINGS: The bowel gas pattern is nonobstructive. Small volume of contrast is seen in the colon. Diverticular disease of the descending and sigmoid colon is noted. Cholecystectomy clips are identified.  IMPRESSION: No acute abnormality.  Diverticulosis.   Electronically Signed   By: Drusilla Kanner M.D.   On: 03/19/2015 20:47   Mr Abd W/wo Cm/mrcp  03/18/2015   CLINICAL DATA:  Recurrent pancreatitis, status post cholecystectomy. Evaluate for common duct stone. Evaluate for pancreas divisum.  EXAM: MRI ABDOMEN WITHOUT AND WITH CONTRAST (INCLUDING MRCP)  TECHNIQUE: Multiplanar multisequence MR imaging of the abdomen was performed both before and after the administration of intravenous contrast. Heavily T2-weighted images of the biliary and pancreatic ducts were obtained, and three-dimensional MRCP images were rendered by post processing.  CONTRAST:  19mL MULTIHANCE GADOBENATE DIMEGLUMINE 529 MG/ML IV SOLN  COMPARISON:  03/14/2015 CT.  No prior MRI  FINDINGS: Portions of exam are mildly motion degraded.  Lower chest: Left larger than right lower breast nodules. Example 1.3 cm on the left on image 1 of series 1402. Normal heart size without pericardial or pleural effusion.  Hepatobiliary: Multiple hepatic cysts, including a central right hepatic lobe 3.5 cm lesion. Cholecystectomy. No intra or extrahepatic biliary duct dilatation. No evidence of choledocholithiasis.  Pancreas: Moderate inflammation within and surrounding the pancreatic head and uncinate process. There is no pancreatic duct dilatation. The pancreatic duct is presumably decompressed by edematous parenchyma within the head. This makes evaluation for and exclusion of pancreas divisum impossible. Postcontrast areas of  decreased to absent pancreatic enhancement within the head and uncinate process. The largest measures 2.1 cm on image 82 of series 1400 and 3. No peripancreatic fluid collection.  Spleen: Normal  Adrenals/Urinary Tract: Normal adrenal glands. Bilateral renal cysts. No hydronephrosis.  Stomach/Bowel: Normal stomach, without wall thickening. There is mild secondary edema involving the descending duodenum. Large and small bowel loops are otherwise unremarkable.  Vascular/Lymphatic: Normal caliber of the aorta and branch vessels. Separate origins of the splenic and common hepatic arteries. No splenic artery aneurysm. No venous thrombosis. No retroperitoneal or retrocrural adenopathy.  Other: No ascites.  Musculoskeletal: No acute osseous abnormality.  IMPRESSION: 1. Mildly motion degraded exam. 2. Moderate pancreatitis involving the head and uncinate process. Foci of decreased to absent enhancement within are suspicious for complicating necrosis. 3. No biliary duct dilatation or evidence of choledocholithiasis. 4. Suboptimal evaluation for pancreas divisum, secondary to pancreatic edema and resultant compression of the duct within the head. No pancreatic duct dilatation identified. 5. Bilateral inferior breast "Nodules" are likely intramammary lymph nodes. Consider correlation with mammogram, nonemergent/outpatient.   Electronically Signed   By: Jeronimo Greaves M.D.   On: 03/18/2015 19:29    Microbiology: Recent Results (from the past 240 hour(s))  Culture, blood (routine x 2)     Status: None (Preliminary result)   Collection Time: 03/16/15  7:34 PM  Result Value Ref Range Status   Specimen Description BLOOD RIGHT ANTECUBITAL  Final   Special Requests BOTTLES DRAWN AEROBIC ONLY  Final   Culture   Final    NO GROWTH 3 DAYS Performed at [facility]    Report Status PENDING  Incomplete  Culture, blood (routine x 2)     Status: None (Preliminary result)   Collection Time: 03/16/15  7:35 PM  Result  Value Ref Range Status   Specimen Description BLOOD RIGHT HAND  Final   Special Requests IN PEDIATRIC BOTTLE 2.5ML  Final   Culture   Final    NO GROWTH 3 DAYS Performed at [facility]    Report Status PENDING  Incomplete     Labs: Basic Metabolic Panel:  Recent Labs Lab 03/14/15 1152 03/16/15 1208 03/17/15 0600 03/18/15 0503 03/19/15 0539  NA 134* 136 135 135 135  K 3.8 3.5 3.5 3.3* 3.7  CL 101 98* 101 98* 98*  CO2 27 30 26 29 29   GLUCOSE 88 109* 87 104* 95  BUN 8 12 8 7 8   CREATININE 0.54 0.60 0.38* 0.45 0.52  CALCIUM 8.6* 9.3 8.5* 8.6* 9.0   Liver Function Tests:  Recent Labs Lab 03/14/15 1152 03/16/15 1208  AST 15 16  ALT 13* 14  ALKPHOS 72 87  BILITOT 0.4 0.6  PROT 7.2 9.1*  ALBUMIN 3.6 4.1    Recent Labs Lab 03/14/15 1152 03/16/15  1208  LIPASE 80* 21*   No results for input(s): AMMONIA in the last 168 hours. CBC:  Recent Labs Lab 03/14/15 1152 03/16/15 1208 03/17/15 0600 03/20/15 0445  WBC 13.4* 22.7* 19.9* 10.1  NEUTROABS 10.1*  --   --   --   HGB 12.6 14.1 12.3 11.9*  HCT 38.5 43.7 37.7 37.9  MCV 84.6 84.0 85.1 83.5  PLT 158 229 207 279   Cardiac Enzymes: No results for input(s): CKTOTAL, CKMB, CKMBINDEX, TROPONINI in the last 168 hours. BNP: BNP (last 3 results) No results for input(s): BNP in the last 8760 hours.  ProBNP (last 3 results) No results for input(s): PROBNP in the last 8760 hours.  CBG: No results for input(s): GLUCAP in the last 168 hours.     SignedCalvert Cantor, MD [facility] 03/20/2015, 9:04 AM

## 2015-03-20 NOTE — Progress Notes (Signed)
Unable to schedule an appointment with new PCP, Dr. Armen Pickup, due to schedule being already closed for next week. Patient will need to call next week to schedule her follow up the following week. Will let patient know to do this.

## 2015-03-20 NOTE — Progress Notes (Signed)
Stool sample for c-diff pcr sent down. Results pending. Will report off to oncoming nurse and notified MD when results are in.

## 2015-03-20 NOTE — Progress Notes (Signed)
Patient ID: Dana Martin, female   DOB: April 14, 1966, 49 y.o.   MRN: [id number]  Feels a lot better. Minimal abdominal pain. Tolerating diet. Diarrhea occurred after eating and C. Diff pending.  Sitting next to bed. Ready to go home.  Autoimmune markers within normal limits.  Pancreatitis of unclear etiology. F/U with Dr. Dulce Sellar in 3-4 weeks to decide on timing of a possible endoscopic ultrasound (EUS). Ok to go home and can do antibiotics for C. Diff if that comes back positive. Will sign off. Call if questions.

## 2015-03-20 NOTE — Progress Notes (Signed)
Patient stated she has had four watery stool today. Right after she eats per patient it goes right through her. Patient was informed about hospital protocol related to c-diff and enteric precautions will be initiated, stool specimen will be tested for c-diff and MD be notified. MD on call was notified and orders was given. Still awaiting for patient to have BM for c-diff pcr. Will continue to monitor

## 2015-03-20 NOTE — Progress Notes (Signed)
Pt's vitals WNL with no complaints of pain and is tolerating diet. Discussed discharge instructions with patient and had no questions nor concerns. Discharged to home.

## 2015-03-20 NOTE — Telephone Encounter (Signed)
Patient called to request a note to be able to go back to work. Patient stated that she was hospitalized but they weren't able to give the letter to her. Patient was advised of Dr. Orpah Cobb no longer being a PCP at our facility. Patient was told about the 14 day turn around time for letter requests, patient hung up the phone before more information could be provided. Please f/u

## 2015-03-21 LAB — CULTURE, BLOOD (ROUTINE X 2)
CULTURE: NO GROWTH
Culture: NO GROWTH

## 2015-03-23 ENCOUNTER — Telehealth: Payer: Self-pay

## 2015-03-23 MED ORDER — LEVOTHYROXINE SODIUM 175 MCG PO TABS: 175.0000 ug | ORAL_TABLET | Freq: Every day | ORAL | Status: DC

## 2015-03-23 NOTE — Telephone Encounter (Signed)
Spoke with patient and she is aware of her results from her stay at the hospital Patient also requesting a refill on her thyroid medication Medication sent to the pharmacy on file

## 2015-03-23 NOTE — Telephone Encounter (Signed)
-----   Message from Dessa Phi, MD sent at 03/23/2015  9:20 AM EDT ----- ANA, check for lupus is negative Blood cultures done in hospital are negative

## 2015-06-29 ENCOUNTER — Other Ambulatory Visit: Payer: Self-pay | Admitting: *Deleted

## 2015-06-29 MED ORDER — LEVOTHYROXINE SODIUM 175 MCG PO TABS: 175.0000 ug | ORAL_TABLET | Freq: Every day | ORAL | Status: DC

## 2015-11-06 ENCOUNTER — Other Ambulatory Visit: Payer: Self-pay | Admitting: *Deleted

## 2015-11-06 MED ORDER — LEVOTHYROXINE SODIUM 175 MCG PO TABS: 175.0000 ug | ORAL_TABLET | Freq: Every day | ORAL | Status: DC

## 2015-12-15 ENCOUNTER — Telehealth: Payer: Self-pay | Admitting: Internal Medicine

## 2015-12-15 NOTE — Telephone Encounter (Signed)
Pt. Called requesting an OV because she could not get refill on levothyroxine (SYNTHROID) 175 MCG tablet until she saw her PCP. Pt. Has to establish care with a new PCP because she used to see Dr. Orpah CobbAdvani. I said I only had available on May 31 and pt. Became upset and I stated I could put in a note in the system and pt. Hanged up the phone.

## 2015-12-24 DIAGNOSIS — K861 Other chronic pancreatitis: Secondary | ICD-10-CM | POA: Diagnosis not present

## 2015-12-24 DIAGNOSIS — I83811 Varicose veins of right lower extremities with pain: Secondary | ICD-10-CM | POA: Diagnosis not present

## 2015-12-24 DIAGNOSIS — E039 Hypothyroidism, unspecified: Secondary | ICD-10-CM | POA: Diagnosis not present

## 2015-12-24 DIAGNOSIS — I83812 Varicose veins of left lower extremities with pain: Secondary | ICD-10-CM | POA: Diagnosis not present

## 2015-12-24 DIAGNOSIS — I868 Varicose veins of other specified sites: Secondary | ICD-10-CM | POA: Diagnosis not present

## 2015-12-24 DIAGNOSIS — M79669 Pain in unspecified lower leg: Secondary | ICD-10-CM | POA: Diagnosis not present

## 2016-06-17 DIAGNOSIS — Z1231 Encounter for screening mammogram for malignant neoplasm of breast: Secondary | ICD-10-CM | POA: Diagnosis not present

## 2016-06-17 DIAGNOSIS — E039 Hypothyroidism, unspecified: Secondary | ICD-10-CM | POA: Diagnosis not present

## 2016-06-17 DIAGNOSIS — Z0000 Encounter for general adult medical examination without abnormal findings: Secondary | ICD-10-CM | POA: Diagnosis not present

## 2016-06-17 DIAGNOSIS — I868 Varicose veins of other specified sites: Secondary | ICD-10-CM | POA: Diagnosis not present

## 2016-06-17 DIAGNOSIS — Z1211 Encounter for screening for malignant neoplasm of colon: Secondary | ICD-10-CM | POA: Diagnosis not present

## 2016-07-19 ENCOUNTER — Encounter: Payer: Self-pay | Admitting: Vascular Surgery

## 2016-07-29 ENCOUNTER — Encounter: Payer: Self-pay | Admitting: Vascular Surgery

## 2016-08-11 ENCOUNTER — Other Ambulatory Visit: Payer: Self-pay

## 2016-08-11 DIAGNOSIS — I8390 Asymptomatic varicose veins of unspecified lower extremity: Secondary | ICD-10-CM

## 2016-08-15 ENCOUNTER — Encounter: Payer: BLUE CROSS/BLUE SHIELD | Admitting: Vascular Surgery

## 2016-08-23 ENCOUNTER — Encounter: Payer: Self-pay | Admitting: Vascular Surgery

## 2016-08-26 ENCOUNTER — Ambulatory Visit (HOSPITAL_COMMUNITY): Payer: BLUE CROSS/BLUE SHIELD | Attending: Vascular Surgery

## 2016-08-26 ENCOUNTER — Encounter: Payer: BLUE CROSS/BLUE SHIELD | Admitting: Vascular Surgery

## 2016-09-23 IMAGING — CT CT ABD-PELV W/ CM
2 of 5 series · 16 of 46 positions shown, 18 images · IV contrast (Omni 300)
Comparison: 12/06/2013

CLINICAL DATA: Right-sided abdominal pain for 3 days, elevated
lipase

EXAM:
CT ABDOMEN AND PELVIS WITH CONTRAST
TECHNIQUE: Multidetector CT imaging of the abdomen and pelvis was performed
using the standard protocol following bolus administration of
intravenous contrast.
CONTRAST:  100mL OMNIPAQUE IOHEXOL 300 MG/ML  SOLN

[Series 2: abd/ pelvis 5.0 i30f 1 · axial · 0.86mm/px · z∈[+677,+1112]mm · 13 of 97 slices shown, 15 images]
[im 5/97  soft-tissue]
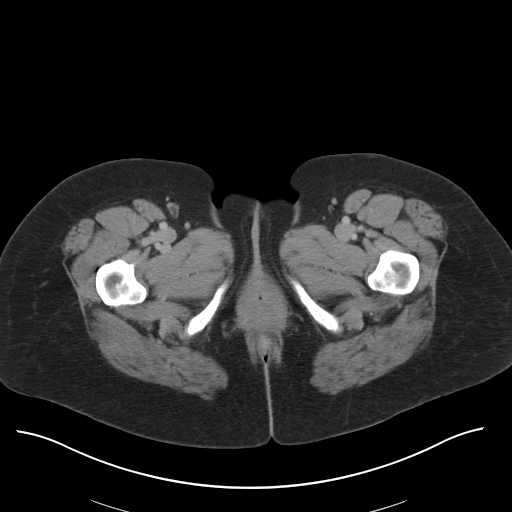
[im 5/97  bone]
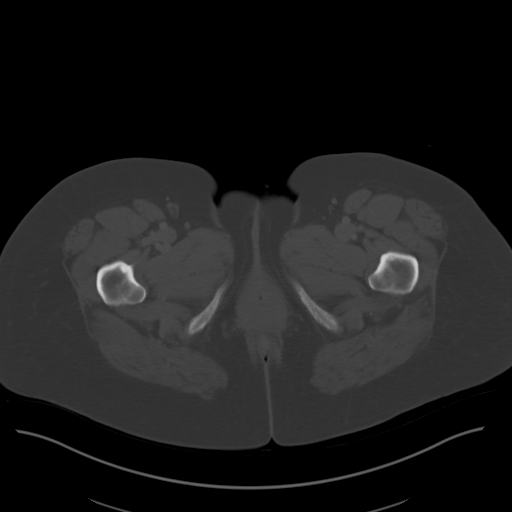
[im 15/97  soft-tissue]
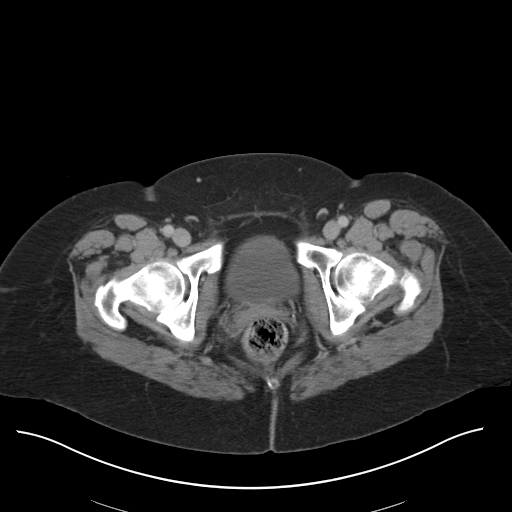
[im 20/97  soft-tissue]
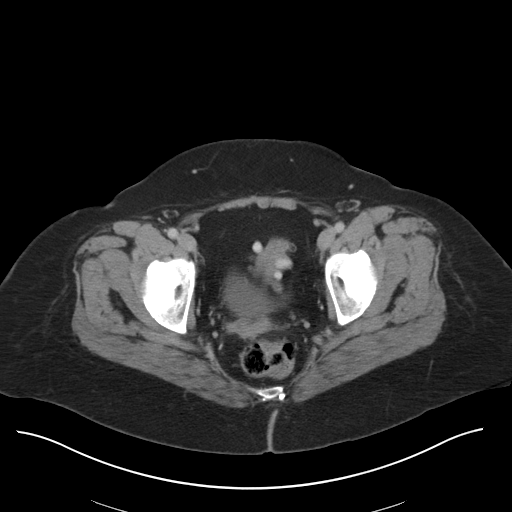
[im 29/97  soft-tissue]
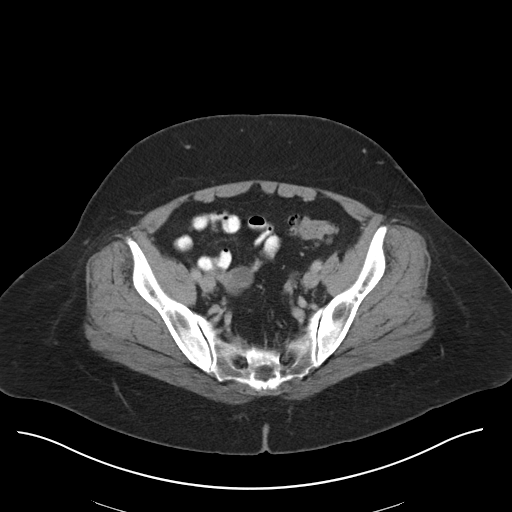
[im 34/97  soft-tissue]
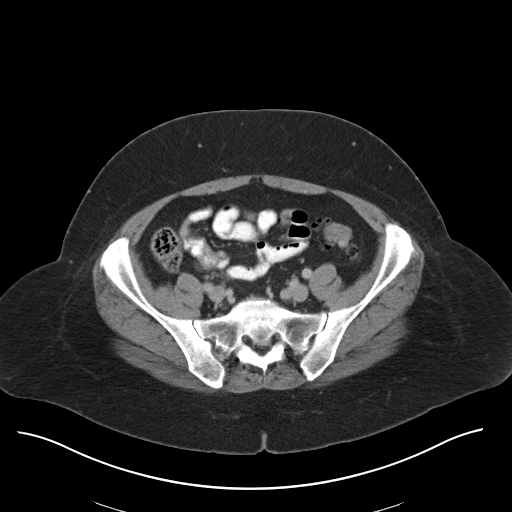
[im 44/97  soft-tissue]
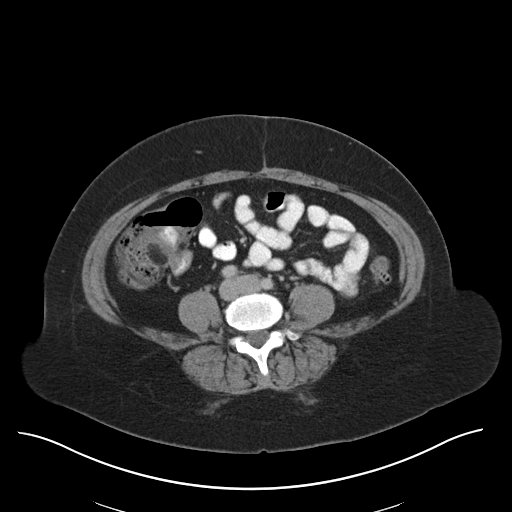
[im 49/97  soft-tissue]
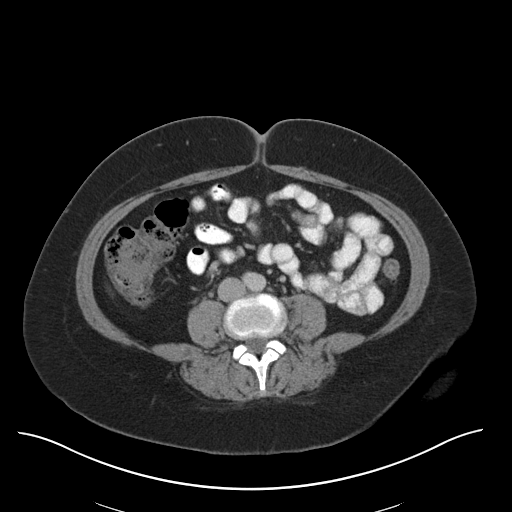
[im 53/97  soft-tissue]
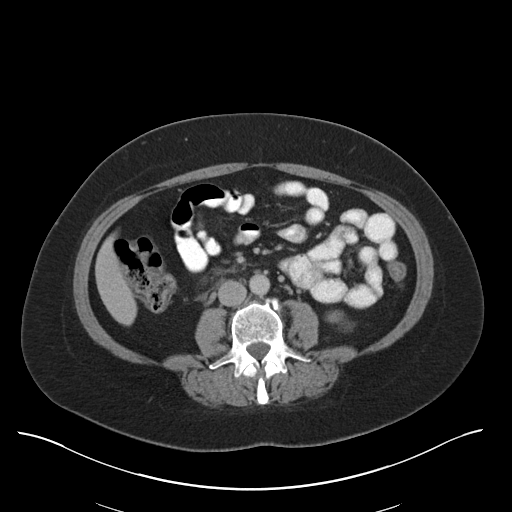
[im 63/97  soft-tissue]
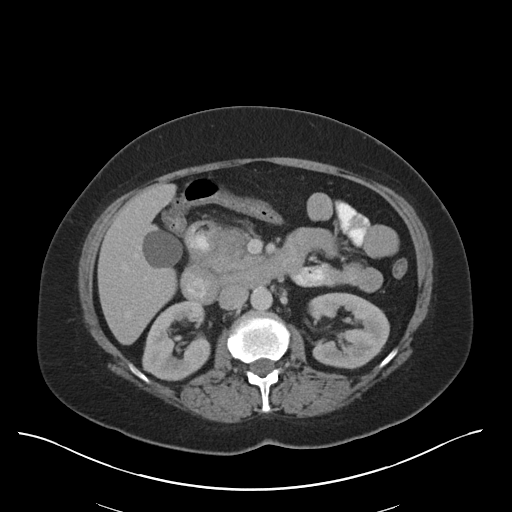
[im 63/97  bone]
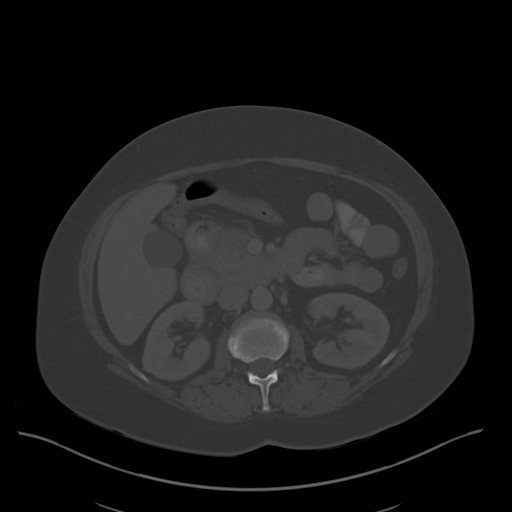
[im 68/97  soft-tissue]
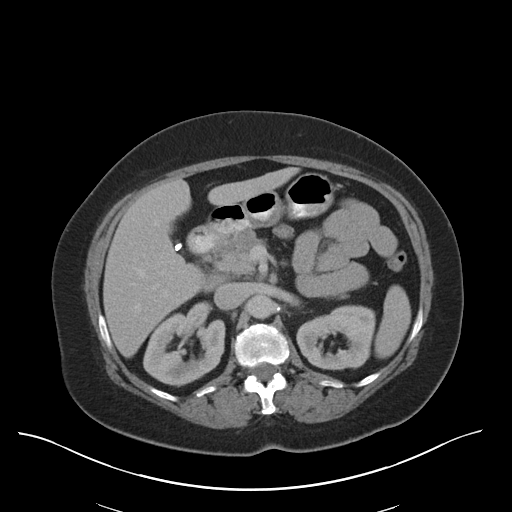
[im 77/97  soft-tissue]
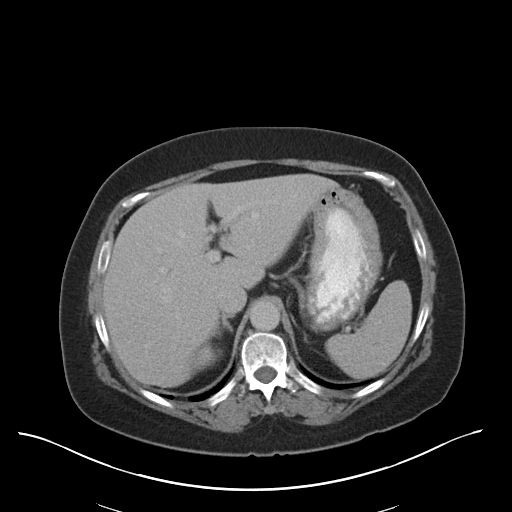
[im 82/97  soft-tissue]
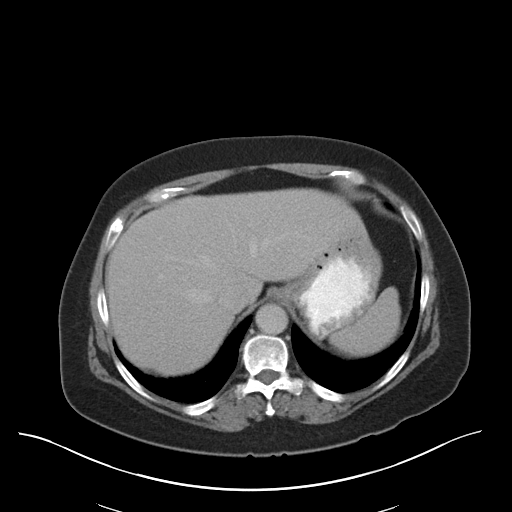
[im 92/97  soft-tissue]
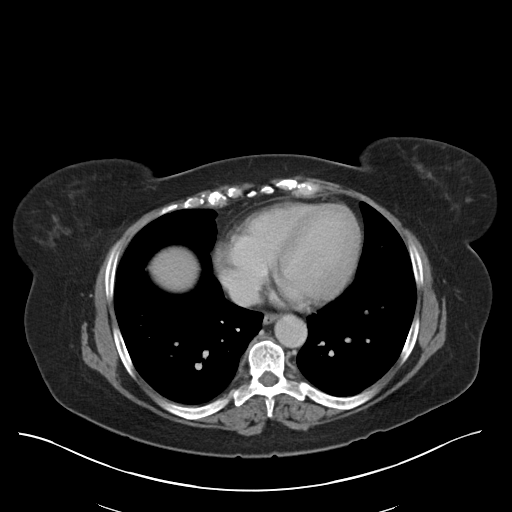

[Series 5: coronals · coronal · 0.75mm/px · 3 of 157 slices shown]
[im 53/157  soft-tissue]
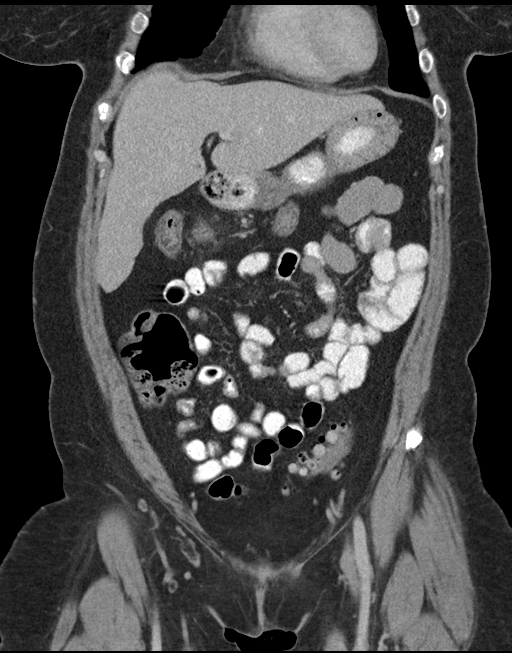
[im 70/157  soft-tissue]
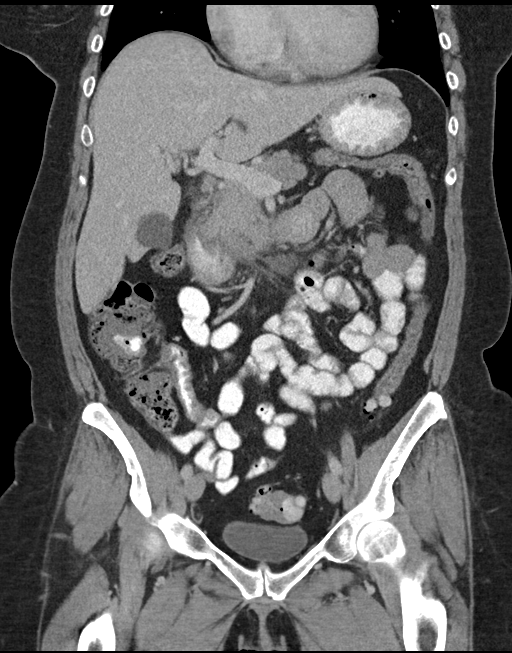
[im 87/157  soft-tissue]
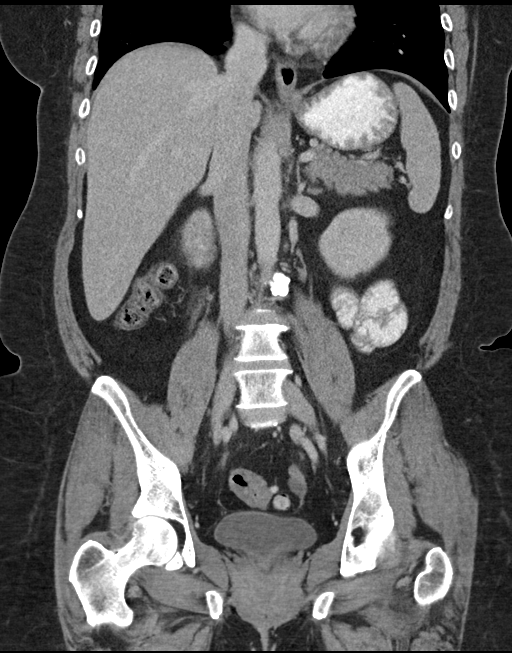

[16 of 46 positions shown; findings below may reference images not displayed]

FINDINGS: Lower chest:  Clear

Hepatobiliary: 3 cm right hepatic cysts stable. Gallbladder
surgically absent. 4 mm cyst inferior tip of the liver on the right.

Pancreas: Enlarged of the pancreatic head with mild surrounding
inflammatory change and heterogeneous attenuation of the pancreatic
head. Associated wall thickening of the medial edge of the adjacent
duodenum. No evidence of pseudocyst.

Spleen: Normal

Adrenals/Urinary Tract: Peripelvic cyst lower pole left kidney
measuring a cm. 1 cm cyst lower pole right kidney. 7 mm cyst upper
pole right kidney. 2 mm nonobstructing upper pole right renal stone.
Bladder normal.

Stomach/Bowel: Significant diverticulosis of the sigmoid colon and
to a lesser degree descending colon. Asymmetric thickening involving
the cecum. Appendix normal. Stomach small bowel normal except for
duodenum as described above.

Vascular/Lymphatic: Mild calcification of the aortoiliac vessels

Reproductive: Uterus appears to be absent. Right ovary decreased in
size when compared to prior study now measuring 29 x 24 mm. 1 cm
focus of low attenuation likely represents ovarian cyst or follicle.
Similar appearance to left ovary.

Other: No additional findings

Musculoskeletal: No acute abnormalities
IMPRESSION: Findings consistent with acute pancreatitis

## 2016-09-28 ENCOUNTER — Encounter: Payer: Self-pay | Admitting: Vascular Surgery

## 2016-10-04 ENCOUNTER — Encounter: Payer: BLUE CROSS/BLUE SHIELD | Admitting: Vascular Surgery

## 2016-12-29 DIAGNOSIS — E039 Hypothyroidism, unspecified: Secondary | ICD-10-CM | POA: Diagnosis not present

## 2016-12-29 DIAGNOSIS — E782 Mixed hyperlipidemia: Secondary | ICD-10-CM | POA: Diagnosis not present

## 2017-01-09 DIAGNOSIS — R109 Unspecified abdominal pain: Secondary | ICD-10-CM | POA: Diagnosis not present

## 2017-01-17 DIAGNOSIS — R109 Unspecified abdominal pain: Secondary | ICD-10-CM | POA: Diagnosis not present

## 2017-06-02 DIAGNOSIS — E039 Hypothyroidism, unspecified: Secondary | ICD-10-CM | POA: Diagnosis not present

## 2017-06-02 DIAGNOSIS — R9431 Abnormal electrocardiogram [ECG] [EKG]: Secondary | ICD-10-CM | POA: Diagnosis not present

## 2017-06-02 DIAGNOSIS — J40 Bronchitis, not specified as acute or chronic: Secondary | ICD-10-CM | POA: Diagnosis not present

## 2017-06-02 DIAGNOSIS — E782 Mixed hyperlipidemia: Secondary | ICD-10-CM | POA: Diagnosis not present

## 2017-06-02 DIAGNOSIS — R002 Palpitations: Secondary | ICD-10-CM | POA: Diagnosis not present

## 2017-07-07 ENCOUNTER — Ambulatory Visit: Payer: BLUE CROSS/BLUE SHIELD | Admitting: Cardiovascular Disease

## 2017-07-07 ENCOUNTER — Encounter: Payer: Self-pay | Admitting: Cardiovascular Disease

## 2017-07-07 VITALS — BP 130/90 | HR 78 | Ht 65.0 in | Wt 194.2 lb

## 2017-07-07 DIAGNOSIS — I493 Ventricular premature depolarization: Secondary | ICD-10-CM | POA: Diagnosis not present

## 2017-07-07 DIAGNOSIS — R030 Elevated blood-pressure reading, without diagnosis of hypertension: Secondary | ICD-10-CM

## 2017-07-07 DIAGNOSIS — I83813 Varicose veins of bilateral lower extremities with pain: Secondary | ICD-10-CM

## 2017-07-07 HISTORY — DX: Ventricular premature depolarization: I49.3

## 2017-07-07 NOTE — Patient Instructions (Signed)
Medication Instructions:  .Your physician recommends that you continue on your current medications as directed. Please refer to the Current Medication list given to you today.  Labwork: none  Testing/Procedures: none  Follow-Up: As needed   

## 2017-07-07 NOTE — Progress Notes (Signed)
Cardiology Office Note   Date:  07/07/2017   ID:  Dana Martin, DOB April 15, 1966, MRN [id number]  PCP:  System, Pcp Not In  Cardiologist:   Chilton Si, MD   No chief complaint on file.     History of Present Illness: Dana Martin is a 51 y.o. female with GERD, COPD and recurrent pancreatitis who presents for an evaluation of palpitations.  She was seen by Arnette Felts, FNP on 06/02/17.  She was noted to have an irregular heart beat on exam and PVCs on EKG.  She has been feeling generally well.  Her only complaint is pain in her legs from varicose veins.  She works at [employer] and is on her feet on concrete floors.  Her legs are painful.  She has tried wearing compression socks but is unable to tolerate them due to swelling in her feet.  She has no orthopnea or PND.  She has not experienced any chest pain or shortness of breath.  She notes palpitations a couple times per week.  It lasts for a second and does not bother her.  There is no associated chest pain, shortness of breath, lightheadedness, or dizziness.  She drinks 2-3 cups of coffee daily.  She has difficulty falling asleep but not staying asleep.  She takes Benadryl was helps with the symptoms.  Overall she follows a healthy diet.  She has recurrent pancreatitis so she avoids alcohol and fried foods.  She also limits salt intake.  She has occasional episodes of chest pain that last for a few seconds and are not exertional.  She thinks this is usually musculoskeletal.  She does a lot of heavy lifting at work.  She does not get much formal exercise outside of work.  This is because she is tired from work and because her legs hurt.   Past Medical History:  Diagnosis Date  . COPD (chronic obstructive pulmonary disease) (HCC)    denies SOB with daily activities  . Full dentures   . GERD (gastroesophageal reflux disease)   . History of pancreatitis   . Hypothyroidism   . Nasal sinus congestion 05/08/2014   with sinus drainage    . PVC (premature ventricular contraction) 07/07/2017  . Stress incontinence   . Symptomatic cholelithiasis 05/2014  . Varicose veins of bilateral lower extremities with pain     Past Surgical History:  Procedure Laterality Date  . BREAST LUMPECTOMY    . CHOLECYSTECTOMY N/A 05/13/2014   Procedure: LAPAROSCOPIC CHOLECYSTECTOMY WITH INTRAOPERATIVE CHOLANGIOGRAM;  Surgeon: Abigail Miyamoto, MD;  Location: [facility];  Service: General;  Laterality: N/A;  . EUS N/A 02/19/2014   Procedure: ESOPHAGEAL ENDOSCOPIC ULTRASOUND (EUS) RADIAL;  Surgeon: Willis Modena, MD;  Location: [facility];  Service: Endoscopy;  Laterality: N/A;  . TUBAL LIGATION    . VAGINAL HYSTERECTOMY  11/13/2001   transvaginal      Current Outpatient Medications  Medication Sig Dispense Refill  . diphenhydrAMINE (BENADRYL) 25 MG tablet Take 12.5 mg by mouth daily as needed for itching (allergic reaction).    . Diphenhydramine-Zinc Acetate (BENADRYL EX) Apply 1 application topically daily as needed (rash).    . Ibuprofen (ADVIL) 200 MG CAPS Take 400 mg by mouth 2 (two) times daily.    . lansoprazole (PREVACID) 15 MG capsule Take 15 mg by mouth daily.     Marland Kitchen levothyroxine (SYNTHROID) 175 MCG tablet Take 1 tablet (175 mcg total) by mouth daily before breakfast. PT NEEDS OV PRIOR FUTURE  REFILLS 30 tablet 0  . Multiple Vitamin (MULTIVITAMIN) tablet Take 1 tablet by mouth daily.    Bertram Gala Glycol-Propyl Glycol (SYSTANE ULTRA) 0.4-0.3 % SOLN Place 1 drop into both eyes 5 (five) times daily as needed (dry eyes/irriation).     No current facility-administered medications for this visit.     Allergies:   Fish allergy; Gadolinium derivatives; Multihance [gadobenate]; Omnipaque [iohexol]; Other; Peanut-containing drug products; and Shellfish allergy    Social History:  The patient  reports that she quit smoking about 4 years ago. she has never used smokeless tobacco. She reports that she drinks alcohol. She  reports that she does not use drugs.   Family History:  The patient's family history includes Atrial fibrillation in her mother; Diabetes in her sister; Heart attack in her mother; Hypertension in her brother, daughter, mother, and sister; Lung cancer in her brother.    ROS:  Please see the history of present illness.   Otherwise, review of systems are positive for none.   All other systems are reviewed and negative.    PHYSICAL EXAM: VS:  BP 130/90   Pulse 78   Ht 5\' 5"  (1.651 m)   Wt 194 lb 3.2 oz (88.1 kg)   BMI 32.32 kg/m  , BMI Body mass index is 32.32 kg/m. GENERAL:  Well appearing HEENT:  Pupils equal round and reactive, fundi not visualized, oral mucosa unremarkable. Mild proptosis. NECK:  No jugular venous distention, waveform within normal limits, carotid upstroke brisk and symmetric, no bruits, no thyromegaly LYMPHATICS:  No cervical adenopathy LUNGS:  Clear to auscultation bilaterally HEART:  Mostly regular with occasional ectopy.  PMI not displaced or sustained,S1 and S2 within normal limits, no S3, no S4, no clicks, no rubs, no murmurs ABD:  Flat, positive bowel sounds normal in frequency in pitch, no bruits, no rebound, no guarding, no midline pulsatile mass, no hepatomegaly, no splenomegaly EXT:  2 plus pulses throughout, no edema, no cyanosis no clubbing SKIN:  No rashes no nodules NEURO:  Cranial nerves II through XII grossly intact, motor grossly intact throughout PSYCH:  Cognitively intact, oriented to person place and time   EKG:  EKG is ordered today. The ekg ordered today demonstrates sinus rhythm rate 78 bpm.     Recent Labs: No results found for requested labs within last 8760 hours.   06/02/17: TSH 2.52, free T4 1.85 Total cholesterol 197, triglycerides 96, HDL 61, LDL 117 Sodium 142, potassium 4.0, BUN 11, creatinine 0.56 WC 14.2, hemoglobin 12.2, hematocrit 37.3, platelets 303  Lipid Panel    Component Value Date/Time   CHOL 185 03/19/2015 0539    TRIG 119 03/19/2015 0539   HDL 34 (L) 03/19/2015 0539   CHOLHDL 5.4 03/19/2015 0539   VLDL 24 03/19/2015 0539   LDLCALC 127 (H) 03/19/2015 0539      Wt Readings from Last 3 Encounters:  07/07/17 194 lb 3.2 oz (88.1 kg)  03/16/15 205 lb (93 kg)  03/18/15 205 lb (93 kg)      ASSESSMENT AND PLAN:  # Varicose veins: Recommend trying stockings.  She can cut out the feet if she needs to.  If this does not help she will consider seeing a vein and vascular specialist.  # PVCs: These are relatively asymptomatic.  She has far fewer than 10% PVCs.  I recommended that she limit her caffeine intake.  Thyroid is mildly abnormal.  Suggest reducing levothyroxine given her mildly elevated free T4.  TSH was within normal limits.  No medication or further testing indicated at this time.  # Elevated bp: Blood pressure is mildly elevated.  If she gets her leg discomfort under control she might be able to exercise more.  Would not start medication at this time but continue to monitor.   Current medicines are reviewed at length with the patient today.  The patient does not have concerns regarding medicines.  The following changes have been made:  no change  Labs/ tests ordered today include:  No orders of the defined types were placed in this encounter.    Disposition:   FU with Dana Martin C. Duke Salvia, MD, [facility] as needed    This note was written with the assistance of speech recognition software.  Please excuse any transcriptional errors.  Signed, Freda Jaquith C. Duke Salvia, MD, [facility]  07/07/2017 10:02 AM    [facility]

## 2017-11-15 ENCOUNTER — Other Ambulatory Visit: Payer: Self-pay | Admitting: Nurse Practitioner

## 2017-11-15 DIAGNOSIS — Z1231 Encounter for screening mammogram for malignant neoplasm of breast: Secondary | ICD-10-CM | POA: Diagnosis not present

## 2017-11-15 DIAGNOSIS — E782 Mixed hyperlipidemia: Secondary | ICD-10-CM | POA: Diagnosis not present

## 2017-11-15 DIAGNOSIS — Z1211 Encounter for screening for malignant neoplasm of colon: Secondary | ICD-10-CM | POA: Diagnosis not present

## 2017-11-15 DIAGNOSIS — E039 Hypothyroidism, unspecified: Secondary | ICD-10-CM | POA: Diagnosis not present

## 2017-11-15 DIAGNOSIS — Z0000 Encounter for general adult medical examination without abnormal findings: Secondary | ICD-10-CM | POA: Diagnosis not present

## 2017-11-15 LAB — VITAMIN D 25 HYDROXY (VIT D DEFICIENCY, FRACTURES): VIT D 25 HYDROXY: 17.7

## 2017-11-15 LAB — HEPATIC FUNCTION PANEL
ALK PHOS: 82 (ref 25–125)
ALT: 13 (ref 7–35)
AST: 14 (ref 13–35)
Bilirubin, Total: 0.2

## 2017-11-15 LAB — LIPID PANEL
Cholesterol: 170 (ref 0–200)
HDL: 47 (ref 35–70)
LDL Cholesterol: 107
LDl/HDL Ratio: 2.3
Triglycerides: 82 (ref 40–160)

## 2017-11-15 LAB — CBC AND DIFFERENTIAL
HCT: 38 (ref 36–46)
Hemoglobin: 12.6 (ref 12.0–16.0)
PLATELETS: 184 (ref 150–399)
WBC: 7.6

## 2017-11-15 LAB — BASIC METABOLIC PANEL
GLUCOSE: 80
POTASSIUM: 3.8 (ref 3.4–5.3)
SODIUM: 142 (ref 137–147)

## 2017-11-15 LAB — HEMOGLOBIN A1C: HEMOGLOBIN A1C: 5.6 % (ref 4.0–5.6)

## 2017-11-15 LAB — TSH: TSH: 3.23 (ref 0.41–5.90)

## 2017-12-07 ENCOUNTER — Ambulatory Visit
Admission: RE | Admit: 2017-12-07 | Discharge: 2017-12-07 | Disposition: A | Discharge status: Home / Self Care | Payer: BLUE CROSS/BLUE SHIELD | Source: Ambulatory Visit | Attending: Nurse Practitioner | Admitting: Nurse Practitioner

## 2017-12-07 DIAGNOSIS — Z1231 Encounter for screening mammogram for malignant neoplasm of breast: Secondary | ICD-10-CM | POA: Diagnosis not present

## 2017-12-11 ENCOUNTER — Other Ambulatory Visit: Payer: Self-pay | Admitting: Nurse Practitioner

## 2017-12-11 DIAGNOSIS — R921 Mammographic calcification found on diagnostic imaging of breast: Secondary | ICD-10-CM

## 2017-12-14 ENCOUNTER — Other Ambulatory Visit: Payer: Self-pay | Admitting: Nurse Practitioner

## 2017-12-14 ENCOUNTER — Ambulatory Visit
Admission: RE | Admit: 2017-12-14 | Discharge: 2017-12-14 | Disposition: A | Discharge status: Home / Self Care | Payer: BLUE CROSS/BLUE SHIELD | Source: Ambulatory Visit | Attending: Nurse Practitioner | Admitting: Nurse Practitioner

## 2017-12-14 DIAGNOSIS — R921 Mammographic calcification found on diagnostic imaging of breast: Secondary | ICD-10-CM | POA: Diagnosis not present

## 2018-05-07 ENCOUNTER — Encounter: Payer: Self-pay | Admitting: Nurse Practitioner

## 2018-05-07 DIAGNOSIS — E782 Mixed hyperlipidemia: Secondary | ICD-10-CM | POA: Insufficient documentation

## 2018-05-07 DIAGNOSIS — E785 Hyperlipidemia, unspecified: Secondary | ICD-10-CM

## 2018-05-17 ENCOUNTER — Encounter: Payer: Self-pay | Admitting: Nurse Practitioner

## 2018-05-17 ENCOUNTER — Ambulatory Visit: Payer: BLUE CROSS/BLUE SHIELD | Admitting: Nurse Practitioner

## 2018-05-17 VITALS — BP 136/88 | HR 89 | Temp 98.0°F | Ht 65.0 in | Wt 195.6 lb

## 2018-05-17 DIAGNOSIS — E785 Hyperlipidemia, unspecified: Secondary | ICD-10-CM | POA: Diagnosis not present

## 2018-05-17 DIAGNOSIS — I83813 Varicose veins of bilateral lower extremities with pain: Secondary | ICD-10-CM | POA: Diagnosis not present

## 2018-05-17 DIAGNOSIS — E039 Hypothyroidism, unspecified: Secondary | ICD-10-CM

## 2018-05-17 NOTE — Progress Notes (Addendum)
Subjective:     Patient ID: Dana Martin , female    DOB: [birth year] , 52 y.o.   MRN: [id number]   Hyperlipidemia - No current medications.  Overall attempting to avoid fried and fatty foods.   Chronic Pancreatitis - No recent episodes.    Thyroid Problem  Presents for follow-up visit. Patient reports no anxiety, fatigue or weight gain. The symptoms have been stable.     Past Medical History:  Diagnosis Date  . COPD (chronic obstructive pulmonary disease) (HCC)    denies SOB with daily activities  . Full dentures   . GERD (gastroesophageal reflux disease)   . History of pancreatitis   . Hypothyroidism   . Nasal sinus congestion 05/08/2014   with sinus drainage  . PVC (premature ventricular contraction) 07/07/2017  . Stress incontinence   . Symptomatic cholelithiasis 05/2014  . Varicose veins of bilateral lower extremities with pain       Current Outpatient Medications:  .  albuterol (PROVENTIL HFA;VENTOLIN HFA) 108 (90 Base) MCG/ACT inhaler, Inhale 2 puffs into the lungs every 6 (six) hours as needed for wheezing or shortness of breath., Disp: , Rfl:  .  diphenhydrAMINE (BENADRYL) 25 MG tablet, Take 12.5 mg by mouth daily as needed for itching (allergic reaction)., Disp: , Rfl:  .  famotidine (PEPCID) 20 MG tablet, Take 20 mg by mouth 2 (two) times daily., Disp: , Rfl:  .  Ibuprofen (ADVIL) 200 MG CAPS, Take 400 mg by mouth 2 (two) times daily., Disp: , Rfl:  .  levothyroxine (SYNTHROID) 175 MCG tablet, Take 1 tablet (175 mcg total) by mouth daily before breakfast. PT NEEDS OV PRIOR FUTURE REFILLS, Disp: 30 tablet, Rfl: 0 .  Multiple Vitamin (MULTIVITAMIN) tablet, Take 1 tablet by mouth daily., Disp: , Rfl:  .  Polyethyl Glycol-Propyl Glycol (SYSTANE ULTRA) 0.4-0.3 % SOLN, Place 1 drop into both eyes 5 (five) times daily as needed (dry eyes/irriation)., Disp: , Rfl:    Review of Systems  Constitutional: Negative.  Negative for fatigue and weight gain.  HENT: Negative.    Respiratory: Negative.   Cardiovascular: Negative.   Gastrointestinal: Negative.   Musculoskeletal: Negative.   Skin: Negative.   Psychiatric/Behavioral: Negative.  The patient is not nervous/anxious.      Today's Vitals   05/17/18 0855  BP: 136/88  Pulse: 89  Temp: 98 F (36.7 C)  TempSrc: Oral  SpO2: 95%  Weight: 195 lb 9.6 oz (88.7 kg)  Height: 5\' 5"  (1.651 m)  PainSc: 0-No pain   Body mass index is 32.55 kg/m.   Objective:  Physical Exam  Constitutional: She appears well-developed and well-nourished.  Cardiovascular: Normal rate, regular rhythm, normal heart sounds and intact distal pulses.  Pulmonary/Chest: Effort normal and breath sounds normal.  Musculoskeletal: Normal range of motion. She exhibits no tenderness.  Varicose veins are present bilateral lower extremities  Skin: Skin is warm and dry. Capillary refill takes less than 2 seconds.  Psychiatric: She has a normal mood and affect.        Assessment And Plan:     1. Acquired hypothyroidism  Chronic, good control  Continue with current medications  2. Hyperlipidemia, unspecified hyperlipidemia type  Chronic, no current medications  Will check lipid panel.   Encouraged to limit intake of fried and fatty foods.    3. Varicose veins of both lower extremities with pain  Chronic. Stable,  At this time she is not interested in having laser surgery for treatment   Did  not go to the referral made previously.        Arnette Felts, FNP

## 2018-05-17 NOTE — Patient Instructions (Addendum)
  Varicose Veins Varicose veins are veins that have become enlarged and twisted. They are usually seen in the legs but can occur in other parts of the body as well. What are the causes? This condition is the result of valves in the veins not working properly. Valves in the veins help to return blood from the leg to the heart. If these valves are damaged, blood flows backward and backs up into the veins in the leg near the skin. This causes the veins to become larger. What increases the risk? People who are on their feet a lot, who are pregnant, or who are overweight are more likely to develop varicose veins. What are the signs or symptoms?  Bulging, twisted-appearing, bluish veins, most commonly found on the legs.  Leg pain or a feeling of heaviness. These symptoms may be worse at the end of the day.  Leg swelling.  Changes in skin color. How is this diagnosed? A health care provider can usually diagnose varicose veins by examining your legs. Your health care provider may also recommend an ultrasound of your leg veins. How is this treated? Most varicose veins can be treated at home.However, other treatments are available for people who have persistent symptoms or want to improve the cosmetic appearance of the varicose veins. These treatment options include:  Sclerotherapy. A solution is injected into the vein to close it off.  Laser treatment. A laser is used to heat the vein to close it off.  Radiofrequency vein ablation. An electrical current produced by radio waves is used to close off the vein.  Phlebectomy. The vein is surgically removed through small incisions made over the varicose vein.  Vein ligation and stripping. The vein is surgically removed through incisions made over the varicose vein after the vein has been tied (ligated).  Follow these instructions at home:  Do not stand or sit in one position for long periods of time. Do not sit with your legs crossed. Rest with  your legs raised during the day.  Wear compression stockings as directed by your health care provider. These stockings help to prevent blood clots and reduce swelling in your legs.  Do not wear other tight, encircling garments around your legs, pelvis, or waist.  Walk as much as possible to increase blood flow.  Raise the foot of your bed at night with 2-inch blocks.  If you get a cut in the skin over the vein and the vein bleeds, lie down with your leg raised and press on it with a clean cloth until the bleeding stops. Then place a bandage (dressing) on the cut. See your health care provider if it continues to bleed. Contact a health care provider if:  The skin around your ankle starts to break down.  You have pain, redness, tenderness, or hard swelling in your leg over a vein.  You are uncomfortable because of leg pain.  This information is not intended to replace advice given to you by your health care provider. Make sure you discuss any questions you have with your health care provider.  1. May use Capzasin cream to legs for leg pain, use ibuprofen if not effective.  2. Continue with avoiding fried and fatty foods      Document Released: 05/04/2005 Document Revised: 12/31/2015 Document Reviewed: 01/26/2016 Elsevier Interactive Patient Education  2017 [employer].

## 2018-05-18 LAB — TSH: TSH: 1.51 u[IU]/mL (ref 0.450–4.500)

## 2018-05-18 LAB — T3, FREE: T3, Free: 2.7 pg/mL (ref 2.0–4.4)

## 2018-05-18 LAB — LIPID PANEL
CHOL/HDL RATIO: 3.2 ratio (ref 0.0–4.4)
CHOLESTEROL TOTAL: 198 mg/dL (ref 100–199)
HDL: 61 mg/dL (ref >39)
LDL CALC: 122 mg/dL — AB (ref 0–99)
TRIGLYCERIDES: 75 mg/dL (ref 0–149)
VLDL CHOLESTEROL CAL: 15 mg/dL (ref 5–40)

## 2018-05-18 LAB — T4: T4 TOTAL: 10.8 ug/dL (ref 4.5–12.0)

## 2018-07-02 ENCOUNTER — Other Ambulatory Visit: Payer: Self-pay | Admitting: Nurse Practitioner

## 2018-07-02 ENCOUNTER — Ambulatory Visit
Admission: RE | Admit: 2018-07-02 | Discharge: 2018-07-02 | Disposition: A | Discharge status: Home / Self Care | Payer: BLUE CROSS/BLUE SHIELD | Source: Ambulatory Visit | Attending: Nurse Practitioner | Admitting: Nurse Practitioner

## 2018-07-02 DIAGNOSIS — R921 Mammographic calcification found on diagnostic imaging of breast: Secondary | ICD-10-CM

## 2018-07-02 DIAGNOSIS — R922 Inconclusive mammogram: Secondary | ICD-10-CM | POA: Diagnosis not present

## 2018-07-02 DIAGNOSIS — R928 Other abnormal and inconclusive findings on diagnostic imaging of breast: Secondary | ICD-10-CM

## 2018-07-02 NOTE — Progress Notes (Signed)
mamm

## 2018-07-08 DIAGNOSIS — J209 Acute bronchitis, unspecified: Secondary | ICD-10-CM | POA: Diagnosis not present

## 2018-07-11 ENCOUNTER — Other Ambulatory Visit: Payer: Self-pay | Admitting: Nurse Practitioner

## 2018-07-23 ENCOUNTER — Other Ambulatory Visit: Payer: Self-pay | Admitting: Nurse Practitioner

## 2018-07-24 ENCOUNTER — Ambulatory Visit: Payer: BLUE CROSS/BLUE SHIELD | Admitting: Internal Medicine

## 2018-07-24 ENCOUNTER — Encounter: Payer: Self-pay | Admitting: Internal Medicine

## 2018-07-24 VITALS — BP 130/80 | HR 98 | Temp 98.2°F | Ht 65.0 in | Wt 193.2 lb

## 2018-07-24 DIAGNOSIS — R059 Cough, unspecified: Secondary | ICD-10-CM

## 2018-07-24 DIAGNOSIS — R05 Cough: Secondary | ICD-10-CM | POA: Diagnosis not present

## 2018-07-24 DIAGNOSIS — J40 Bronchitis, not specified as acute or chronic: Secondary | ICD-10-CM

## 2018-07-24 MED ORDER — DOXYCYCLINE HYCLATE 100 MG PO TABS: 100.0000 mg | ORAL_TABLET | Freq: Two times a day (BID) | ORAL | 0 refills | Status: AC

## 2018-07-24 MED ORDER — IPRATROPIUM-ALBUTEROL 0.5-2.5 (3) MG/3ML IN SOLN: 3.0000 mL | Freq: Once | RESPIRATORY_TRACT | Status: DC

## 2018-07-24 NOTE — Patient Instructions (Signed)
Use your Albuterol inhaler every 6  Hours for 3 days then twice a day for 3 days, then as needed.    Acute Bronchitis, Adult Acute bronchitis is when air tubes (bronchi) in the lungs suddenly get swollen. The condition can make it hard to breathe. It can also cause these symptoms:  A cough.  Coughing up clear, yellow, or green mucus.  Wheezing.  Chest congestion.  Shortness of breath.  A fever.  Body aches.  Chills.  A sore throat.  Follow these instructions at home: Medicines  Take over-the-counter and prescription medicines only as told by your doctor.  If you were prescribed an antibiotic medicine, take it as told by your doctor. Do not stop taking the antibiotic even if you start to feel better. General instructions  Rest.  Drink enough fluids to keep your pee (urine) clear or pale yellow.  Avoid smoking and secondhand smoke. If you smoke and you need help quitting, ask your doctor. Quitting will help your lungs heal faster.  Use an inhaler, cool mist vaporizer, or humidifier as told by your doctor.  Keep all follow-up visits as told by your doctor. This is important. How is this prevented? To lower your risk of getting this condition again:  Wash your hands often with soap and water. If you cannot use soap and water, use hand sanitizer.  Avoid contact with people who have cold symptoms.  Try not to touch your hands to your mouth, nose, or eyes.  Make sure to get the flu shot every year.  Contact a doctor if:  Your symptoms do not get better in 2 weeks. Get help right away if:  You cough up blood.  You have chest pain.  You have very bad shortness of breath.  You become dehydrated.  You faint (pass out) or keep feeling like you are going to pass out.  You keep throwing up (vomiting).  You have a very bad headache.  Your fever or chills gets worse. This information is not intended to replace advice given to you by your health care provider.  Make sure you discuss any questions you have with your health care provider. Document Released: 01/11/2008 Document Revised: 03/02/2016 Document Reviewed: 01/13/2016 Elsevier Interactive Patient Education  [employer]2018 [employer] Inc[employer]

## 2018-07-24 NOTE — Progress Notes (Signed)
Subjective:     Patient ID: Dana Martin , female    DOB: Jan 31, 1966 , 52 y.o.   MRN: [id number]   Chief Complaint  Patient presents with  . Cough    C/O still coughing for the past 2 weeks     HPI Was at Urgent Care 12/1 and was diagnosed with Bronchitis and was placed on Zpack and prednisone for 5 days. She had a cold the week before and  Got better, but few days later developed the cough. The cough is not as bad, but now is productive and at that time was not. No fever since seen at Urgent Care. Has been wheezing, cant get a good deep breath. Has only use her Albuterol only ones in the am.    Past Medical History:  Diagnosis Date  . COPD (chronic obstructive pulmonary disease) (HCC)    denies SOB with daily activities  . Full dentures   . GERD (gastroesophageal reflux disease)   . History of pancreatitis   . Hypothyroidism   . Nasal sinus congestion 05/08/2014   with sinus drainage  . PVC (premature ventricular contraction) 07/07/2017  . Stress incontinence   . Symptomatic cholelithiasis 05/2014  . Varicose veins of bilateral lower extremities with pain      Family History  Problem Relation Age of Onset  . Hypertension Mother   . Atrial fibrillation Mother   . Heart attack Mother   . Hypertension Sister   . Hypertension Brother   . Hypertension Daughter   . Lung cancer Brother   . Diabetes Sister      Current Outpatient Medications:  .  albuterol (PROVENTIL HFA;VENTOLIN HFA) 108 (90 Base) MCG/ACT inhaler, Inhale 2 puffs into the lungs every 6 (six) hours as needed for wheezing or shortness of breath., Disp: , Rfl:  .  diphenhydrAMINE (BENADRYL) 25 MG tablet, Take 12.5 mg by mouth daily as needed for itching (allergic reaction)., Disp: , Rfl:  .  famotidine (PEPCID) 20 MG tablet, Take 20 mg by mouth 2 (two) times daily., Disp: , Rfl:  .  Ibuprofen (ADVIL) 200 MG CAPS, Take 400 mg by mouth 2 (two) times daily., Disp: , Rfl:  .  Multiple Vitamin (MULTIVITAMIN)  tablet, Take 1 tablet by mouth daily., Disp: , Rfl:  .  Polyethyl Glycol-Propyl Glycol (SYSTANE ULTRA) 0.4-0.3 % SOLN, Place 1 drop into both eyes 5 (five) times daily as needed (dry eyes/irriation)., Disp: , Rfl:  .  SYNTHROID 175 MCG tablet, TAKE 1 TABLET BY MOUTH EVERY DAY, Disp: 90 tablet, Rfl: 0   Allergies  Allergen Reactions  . Fish Allergy Hives  . Gadolinium Derivatives Hives, Itching and Rash    Pt stated she felt itching and burning in her throat immediately following the injection.  . Multihance [Gadobenate] Hives, Itching and Rash    Itching and burning in her throat. Hives and itching of her skin.  . Omnipaque [Iohexol] Hives  . Other Nausea And Vomiting    ALL NARCOTICS  . Peanut-Containing Drug Products Hives  . Shellfish Allergy Hives  . Codeine Nausea And Vomiting  . Oxycodone Nausea And Vomiting     Review of Systems  Constitutional: Negative for chills, diaphoresis and fever.  HENT: Negative for congestion, postnasal drip and rhinorrhea.   Respiratory: Positive for cough and wheezing.      Today's Vitals   07/24/18 1424  BP: 130/80  Pulse: 98  Temp: 98.2 F (36.8 C)  TempSrc: Oral  SpO2: 92%  Weight:  193 lb 3.2 oz (87.6 kg)  Height: 5\' 5"  (1.651 m)   Body mass index is 32.15 kg/m.   Objective:  Physical Exam Vitals signs and nursing note reviewed.  Constitutional:      General: She is not in acute distress.    Appearance: She is obese. She is not toxic-appearing.  HENT:     Head: Normocephalic.     Right Ear: External ear normal.     Left Ear: External ear normal.     Nose: Nose normal.  Eyes:     General: No scleral icterus.    Conjunctiva/sclera: Conjunctivae normal.  Neck:     Musculoskeletal: Neck supple.  Cardiovascular:     Rate and Rhythm: Normal rate and regular rhythm.     Heart sounds: No murmur.  Pulmonary:     Effort: Pulmonary effort is normal. No respiratory distress.     Breath sounds: Normal breath sounds. No wheezing,  rhonchi or rales.  Lymphadenopathy:     Cervical: No cervical adenopathy.  Skin:    General: Skin is warm and dry.  Neurological:     General: No focal deficit present.     Mental Status: She is alert and oriented to person, place, and time.  Psychiatric:        Mood and Affect: Mood normal.        Behavior: Behavior normal.        Thought Content: Thought content normal.        Judgment: Judgment normal.    Pulse ox after duo neb 97% Assessment And Plan:      Bronchitis - ipratropium-albuterol (DUONEB) 0.5-2.5 (3) MG/3ML nebulizer solution 3 mL I placed her on Doxy 100 mg bid x 7 days  Fu prn.  Faithlyn Recktenwald RODRIGUEZ-SOUTHWORTH, PA-C

## 2018-08-25 ENCOUNTER — Other Ambulatory Visit: Payer: Self-pay | Admitting: Nurse Practitioner

## 2018-11-22 ENCOUNTER — Encounter: Payer: Self-pay | Admitting: Nurse Practitioner

## 2018-11-28 ENCOUNTER — Other Ambulatory Visit: Payer: Self-pay

## 2018-11-28 ENCOUNTER — Ambulatory Visit (INDEPENDENT_AMBULATORY_CARE_PROVIDER_SITE_OTHER): Payer: BLUE CROSS/BLUE SHIELD | Admitting: Nurse Practitioner

## 2018-11-28 ENCOUNTER — Encounter: Payer: Self-pay | Admitting: Nurse Practitioner

## 2018-11-28 VITALS — BP 130/72 | HR 93 | Temp 97.8°F | Ht 66.0 in | Wt 189.8 lb

## 2018-11-28 DIAGNOSIS — E039 Hypothyroidism, unspecified: Secondary | ICD-10-CM

## 2018-11-28 DIAGNOSIS — Z0000 Encounter for general adult medical examination without abnormal findings: Secondary | ICD-10-CM

## 2018-11-28 DIAGNOSIS — Z1159 Encounter for screening for other viral diseases: Secondary | ICD-10-CM

## 2018-11-28 DIAGNOSIS — R3121 Asymptomatic microscopic hematuria: Secondary | ICD-10-CM

## 2018-11-28 DIAGNOSIS — Z23 Encounter for immunization: Secondary | ICD-10-CM | POA: Diagnosis not present

## 2018-11-28 DIAGNOSIS — E785 Hyperlipidemia, unspecified: Secondary | ICD-10-CM | POA: Diagnosis not present

## 2018-11-28 LAB — POCT URINALYSIS DIPSTICK
Glucose, UA: NEGATIVE
Ketones, UA: NEGATIVE
Leukocytes, UA: NEGATIVE
Nitrite, UA: NEGATIVE
Protein, UA: POSITIVE — AB
Spec Grav, UA: 1.02 (ref 1.010–1.025)
Urobilinogen, UA: 0.2 E.U./dL
pH, UA: 7.5 (ref 5.0–8.0)

## 2018-11-28 MED ORDER — TETANUS-DIPHTH-ACELL PERTUSSIS 5-2.5-18.5 LF-MCG/0.5 IM SUSP
0.5000 mL | Freq: Once | INTRAMUSCULAR | Status: AC
  Administered 2018-11-28: 0.5 mL via INTRAMUSCULAR

## 2018-11-28 NOTE — Progress Notes (Signed)
Subjective:     Patient ID: Dana Martin , female    DOB: 01/30/1966 , 53 y.o.   MRN: [id number]   Chief Complaint  Patient presents with  . Annual Exam   The patient states she uses status post hysterectomy for birth control. Last LMP was No LMP recorded. Patient has had a hysterectomy.. Negative for Dysmenorrhea and Negative for Menorrhagia Mammogram last done 07/02/2018. Next due in Dec 24, 2018.  Negative for: breast discharge, breast lump(s), breast pain and breast self exam.  Pertinent negatives include abnormal bleeding (hematology), anxiety, decreased libido, depression, difficulty falling sleep, dyspareunia, history of infertility, nocturia, sexual dysfunction, sleep disturbances, urinary incontinence, urinary urgency, vaginal discharge and vaginal itching. Diet regular.  The patient states her exercise level is   minimal, only with work in the [employer], [employer].    She is working as a Therapist, music at [employer].    The patient's tobacco use is:  Social History   Tobacco Use  Smoking Status Former Smoker  . Last attempt to quit: 05/08/2013  . Years since quitting: 5.5  Smokeless Tobacco Never Used  . She has been exposed to passive smoke. The patient's alcohol use is:  Social History   Substance and Sexual Activity  Alcohol Use Not Currently   Additional information: Last pap - hysterectomy.   HPI  Thyroid Problem  Presents for follow-up visit. Patient reports no anxiety, diarrhea, fatigue, menstrual problem or palpitations. The symptoms have been stable.  Foot Injury   The incident occurred more than 1 week ago. There was no injury mechanism. The pain is present in the left foot. The patient is experiencing no pain. The pain has been intermittent since onset. Pertinent negatives include no inability to bear weight. The treatment provided mild relief.     Past Medical History:  Diagnosis Date  . COPD (chronic obstructive pulmonary disease) ([facility])     denies SOB with daily activities  . Full dentures   . GERD (gastroesophageal reflux disease)   . History of pancreatitis   . Hypothyroidism   . Nasal sinus congestion 05/08/2014   with sinus drainage  . PVC (premature ventricular contraction) 07/07/2017  . Stress incontinence   . Symptomatic cholelithiasis 05/2014  . Varicose veins of bilateral lower extremities with pain      Family History  Problem Relation Age of Onset  . Hypertension Mother   . Atrial fibrillation Mother   . Heart attack Mother   . Hypertension Sister   . Hypertension Brother   . Hypertension Daughter   . Lung cancer Brother   . Diabetes Sister      Current Outpatient Medications:  .  diphenhydrAMINE (BENADRYL) 25 MG tablet, Take 12.5 mg by mouth daily as needed for itching (allergic reaction)., Disp: , Rfl:  .  famotidine (PEPCID) 20 MG tablet, Take 20 mg by mouth 2 (two) times daily., Disp: , Rfl:  .  Ibuprofen (ADVIL) 200 MG CAPS, Take 400 mg by mouth 2 (two) times daily., Disp: , Rfl:  .  Polyethyl Glycol-Propyl Glycol (SYSTANE ULTRA) 0.4-0.3 % SOLN, Place 1 drop into both eyes 5 (five) times daily as needed (dry eyes/irriation)., Disp: , Rfl:  .  SYNTHROID 175 MCG tablet, TAKE 1 TABLET BY MOUTH EVERY DAY, Disp: 90 tablet, Rfl: 0 .  albuterol (PROVENTIL HFA;VENTOLIN HFA) 108 (90 Base) MCG/ACT inhaler, Inhale 2 puffs into the lungs every 6 (six) hours as needed for wheezing or shortness of breath., Disp: , Rfl:  .  Multiple Vitamin (MULTIVITAMIN) tablet, Take 1 tablet by mouth daily., Disp: , Rfl:   Current Facility-Administered Medications:  .  ipratropium-albuterol (DUONEB) 0.5-2.5 (3) MG/3ML nebulizer solution 3 mL, 3 mL, Nebulization, Once, Rodriguez-Southworth, Nettie Elm, PA-C   Allergies  Allergen Reactions  . Fish Allergy Hives  . Gadolinium Derivatives Hives, Itching and Rash    Pt stated she felt itching and burning in her throat immediately following the injection.  . Multihance [Gadobenate]  Hives, Itching and Rash    Itching and burning in her throat. Hives and itching of her skin.  . Omnipaque [Iohexol] Hives  . Other Nausea And Vomiting    ALL NARCOTICS  . Peanut-Containing Drug Products Hives  . Shellfish Allergy Hives  . Codeine Nausea And Vomiting  . Oxycodone Nausea And Vomiting     Review of Systems  Constitutional: Negative for fatigue.  Cardiovascular: Negative.  Negative for chest pain, palpitations and leg swelling.  Gastrointestinal: Negative for diarrhea.  Genitourinary: Negative for menstrual problem.  Musculoskeletal:       Left foot pain lateral   Neurological: Negative for dizziness and headaches.  Psychiatric/Behavioral: The patient is not nervous/anxious.      Today's Vitals   11/28/18 1140  BP: 130/72  Pulse: 93  Temp: 97.8 F (36.6 C)  TempSrc: Oral  SpO2: 97%  Weight: 189 lb 12.8 oz (86.1 kg)  Height: 5\' 6"  (1.676 m)   Body mass index is 30.63 kg/m.   Objective:  Physical Exam Constitutional:      Appearance: Normal appearance.  Cardiovascular:     Rate and Rhythm: Normal rate and regular rhythm.     Pulses: Normal pulses.     Heart sounds: Normal heart sounds. No murmur.  Pulmonary:     Effort: Pulmonary effort is normal.     Breath sounds: Normal breath sounds.  Skin:    General: Skin is warm and dry.     Capillary Refill: Capillary refill takes less than 2 seconds.  Neurological:     General: No focal deficit present.     Mental Status: She is alert and oriented to person, place, and time.  Psychiatric:        Mood and Affect: Mood normal.        Behavior: Behavior normal.        Thought Content: Thought content normal.        Judgment: Judgment normal.         Assessment And Plan:       1. Health maintenance examination . Behavior modifications discussed and diet history reviewed.   . Pt will continue to exercise regularly and modify diet with low GI, plant based foods and decrease intake of processed foods.   . Recommend intake of daily multivitamin, Vitamin D, and calcium.  . Recommend mammogram and colonoscopy for preventive screenings, as well as recommend immunizations that include influenza, TDAP, and Shingles  2. Acquired hypothyroidism  Chronic, controlled  Continue with current medications - TSH - T3 - T4, Free  3. Hyperlipidemia, unspecified hyperlipidemia type  Chronic, controlled  No current medications - Lipid panel - CBC no Diff  4. Encounter for hepatitis C screening test for low risk patient  Will check for Hepatitis C screening due to being born between the years [birth year] - Hepatitis C antibody  5. Encounter for immunization  Will give tetanus vaccine today while in office. Refer to order management. TDAP will be administered to adults 77-38 years old every 10 years. - Tdap (  BOOSTRIX) injection 0.5 mL  6. Asymptomatic microscopic hematuria  Moderate blood in urine will send for culture - Culture, Urine    Arnette Felts, FNP    THE PATIENT IS ENCOURAGED TO PRACTICE SOCIAL DISTANCING DUE TO THE COVID-19 PANDEMIC.

## 2018-11-29 ENCOUNTER — Telehealth: Payer: Self-pay

## 2018-11-29 LAB — CBC
Hematocrit: 38 % (ref 34.0–46.6)
Hemoglobin: 12.8 g/dL (ref 11.1–15.9)
MCH: 27.6 pg (ref 26.6–33.0)
MCHC: 33.7 g/dL (ref 31.5–35.7)
MCV: 82 fL (ref 79–97)
Platelets: 285 10*3/uL (ref 150–450)
RBC: 4.64 x10E6/uL (ref 3.77–5.28)
RDW: 15.2 % (ref 11.7–15.4)
WBC: 8.7 10*3/uL (ref 3.4–10.8)

## 2018-11-29 LAB — T4, FREE: Free T4: 1.78 ng/dL — ABNORMAL HIGH (ref 0.82–1.77)

## 2018-11-29 LAB — LIPID PANEL
Chol/HDL Ratio: 3.3 ratio (ref 0.0–4.4)
Cholesterol, Total: 216 mg/dL — ABNORMAL HIGH (ref 100–199)
HDL: 66 mg/dL (ref >39)
LDL Calculated: 134 mg/dL — ABNORMAL HIGH (ref 0–99)
Triglycerides: 82 mg/dL (ref 0–149)
VLDL Cholesterol Cal: 16 mg/dL (ref 5–40)

## 2018-11-29 LAB — TSH: TSH: 2.27 u[IU]/mL (ref 0.450–4.500)

## 2018-11-29 LAB — T3: T3, Total: 109 ng/dL (ref 71–180)

## 2018-11-29 LAB — HEPATITIS C ANTIBODY: Hep C Virus Ab: <0.1 s/co ratio (ref 0.0–0.9)

## 2018-11-29 LAB — URINE CULTURE

## 2018-11-29 NOTE — Telephone Encounter (Signed)
1st attempt to give results. Results also in mychart

## 2018-11-29 NOTE — Telephone Encounter (Signed)
-----   Message from Arnette Felts, FNP sent at 11/29/2018 12:15 PM EDT ----- Cholesterol levels are slightly up to 216 from 198 limit your intake of fried and fatty foods.  LDL is 134 goal is less than 99.  Also increase your fiber intake. Blood levels are normal.  Thyroid levels are normal except slightly underactive, however this can come from your diet, decrease your bread intake before we make any changes to your medications and make sure you take your medications every day separate from other medications.

## 2018-12-06 ENCOUNTER — Encounter: Payer: Self-pay | Admitting: Nurse Practitioner

## 2018-12-12 ENCOUNTER — Other Ambulatory Visit: Payer: Self-pay

## 2018-12-12 MED ORDER — LEVOTHYROXINE SODIUM 175 MCG PO TABS: 175.0000 ug | ORAL_TABLET | Freq: Every day | ORAL | 0 refills | Status: DC

## 2018-12-12 NOTE — Telephone Encounter (Signed)
Patient called requesting a refill on synthroid. Rx has been sent. YRL,RMA

## 2019-01-03 DIAGNOSIS — Z03818 Encounter for observation for suspected exposure to other biological agents ruled out: Secondary | ICD-10-CM | POA: Diagnosis not present

## 2019-01-23 ENCOUNTER — Other Ambulatory Visit: Payer: Self-pay

## 2019-01-23 ENCOUNTER — Ambulatory Visit
Admission: RE | Admit: 2019-01-23 | Discharge: 2019-01-23 | Disposition: A | Discharge status: Home / Self Care | Payer: BLUE CROSS/BLUE SHIELD | Source: Ambulatory Visit | Attending: Nurse Practitioner | Admitting: Nurse Practitioner

## 2019-01-23 DIAGNOSIS — R921 Mammographic calcification found on diagnostic imaging of breast: Secondary | ICD-10-CM

## 2019-04-19 DIAGNOSIS — J0100 Acute maxillary sinusitis, unspecified: Secondary | ICD-10-CM | POA: Diagnosis not present

## 2019-04-19 DIAGNOSIS — J3089 Other allergic rhinitis: Secondary | ICD-10-CM | POA: Diagnosis not present

## 2019-06-05 ENCOUNTER — Other Ambulatory Visit: Payer: Self-pay

## 2019-06-05 ENCOUNTER — Ambulatory Visit: Payer: BC Managed Care – PPO | Admitting: Nurse Practitioner

## 2019-06-05 ENCOUNTER — Encounter: Payer: Self-pay | Admitting: Nurse Practitioner

## 2019-06-05 VITALS — BP 126/80 | HR 86 | Temp 98.3°F | Ht 66.0 in | Wt 196.4 lb

## 2019-06-05 DIAGNOSIS — E039 Hypothyroidism, unspecified: Secondary | ICD-10-CM | POA: Diagnosis not present

## 2019-06-05 DIAGNOSIS — R5383 Other fatigue: Secondary | ICD-10-CM | POA: Diagnosis not present

## 2019-06-05 DIAGNOSIS — E785 Hyperlipidemia, unspecified: Secondary | ICD-10-CM

## 2019-06-05 MED ORDER — LEVOTHYROXINE SODIUM 175 MCG PO TABS: 175.0000 ug | ORAL_TABLET | Freq: Every day | ORAL | 1 refills | Status: DC

## 2019-06-05 NOTE — Progress Notes (Signed)
Subjective:     Patient ID: Dana Martin , female    DOB: [birth year] , 53 y.o.   MRN: [id number]   Chief Complaint  Patient presents with  . Hypothyroidism    HPI  Admits to not eating healthy and eating more carbohydrates. She feels like when she has constipation this leads to pancreatitis due to her previous.   Thyroid Problem Presents for follow-up visit. Patient reports no anxiety, constipation, diarrhea, fatigue or palpitations. The symptoms have been stable.     Past Medical History:  Diagnosis Date  . COPD (chronic obstructive pulmonary disease) ([place])    denies SOB with daily activities  . Full dentures   . GERD (gastroesophageal reflux disease)   . History of pancreatitis   . Hypothyroidism   . Nasal sinus congestion 05/08/2014   with sinus drainage  . PVC (premature ventricular contraction) 07/07/2017  . Stress incontinence   . Symptomatic cholelithiasis 05/2014  . Varicose veins of bilateral lower extremities with pain      Family History  Problem Relation Age of Onset  . Hypertension Mother   . Atrial fibrillation Mother   . Heart attack Mother   . Hypertension Sister   . Hypertension Brother   . Hypertension Daughter   . Lung cancer Brother   . Diabetes Sister      Current Outpatient Medications:  .  diphenhydrAMINE (BENADRYL) 25 MG tablet, Take 12.5 mg by mouth daily as needed for itching (allergic reaction)., Disp: , Rfl:  .  famotidine (PEPCID) 20 MG tablet, Take 20 mg by mouth 2 (two) times daily., Disp: , Rfl:  .  levothyroxine (SYNTHROID) 175 MCG tablet, Take 1 tablet (175 mcg total) by mouth daily., Disp: 90 tablet, Rfl: 0 .  Multiple Vitamin (MULTIVITAMIN) tablet, Take 1 tablet by mouth daily., Disp: , Rfl:  .  Polyethyl Glycol-Propyl Glycol (SYSTANE ULTRA) 0.4-0.3 % SOLN, Place 1 drop into both eyes 5 (five) times daily as needed (dry eyes/irriation)., Disp: , Rfl:  .  albuterol (PROVENTIL HFA;VENTOLIN HFA) 108 (90 Base) MCG/ACT inhaler,  Inhale 2 puffs into the lungs every 6 (six) hours as needed for wheezing or shortness of breath., Disp: , Rfl:  .  Ibuprofen (ADVIL) 200 MG CAPS, Take 400 mg by mouth 2 (two) times daily., Disp: , Rfl:   Current Facility-Administered Medications:  .  ipratropium-albuterol (DUONEB) 0.5-2.5 (3) MG/3ML nebulizer solution 3 mL, 3 mL, Nebulization, Once, Rodriguez-Southworth, Sunday Spillers, PA-C   Allergies  Allergen Reactions  . Fish Allergy Hives  . Gadolinium Derivatives Hives, Itching and Rash    Pt stated she felt itching and burning in her throat immediately following the injection.  . Multihance [Gadobenate] Hives, Itching and Rash    Itching and burning in her throat. Hives and itching of her skin.  . Omnipaque [Iohexol] Hives  . Other Nausea And Vomiting    ALL NARCOTICS  . Peanut-Containing Drug Products Hives  . Shellfish Allergy Hives  . Codeine Nausea And Vomiting  . Oxycodone Nausea And Vomiting     Review of Systems  Constitutional: Negative for fatigue.  Respiratory: Negative.  Negative for cough.   Cardiovascular: Negative.  Negative for chest pain, palpitations and leg swelling.  Gastrointestinal: Negative for abdominal distention, constipation, diarrhea, nausea and vomiting.  Neurological: Negative.   Psychiatric/Behavioral: Negative.  The patient is not nervous/anxious.      Today's Vitals   06/05/19 1013  BP: 126/80  Pulse: 86  Temp: 98.3 F (36.8 C)  TempSrc:  Oral  Weight: 196 lb 6.4 oz (89.1 kg)  Height: 5\' 6"  (1.676 m)  PainSc: 0-No pain   Body mass index is 31.7 kg/m.   Objective:  Physical Exam Constitutional:      Appearance: Normal appearance.  Cardiovascular:     Rate and Rhythm: Normal rate and regular rhythm.     Pulses: Normal pulses.     Heart sounds: Normal heart sounds. No murmur.  Pulmonary:     Effort: Pulmonary effort is normal. No respiratory distress.     Breath sounds: Normal breath sounds. No wheezing.  Skin:    Capillary Refill:  Capillary refill takes less than 2 seconds.  Neurological:     General: No focal deficit present.     Mental Status: She is alert and oriented to person, place, and time.  Psychiatric:        Mood and Affect: Mood normal.        Behavior: Behavior normal.        Thought Content: Thought content normal.        Judgment: Judgment normal.         Assessment And Plan:     1. Acquired hypothyroidism  Chronic, controlled  Continue with current medications - TSH - T4 - T3, free  2. Hyperlipidemia, unspecified hyperlipidemia type  Chronic, controlled  No current medications  3. Fatigue, unspecified type  This is new fatigue  Will check for metabolic levels.    Encouraged to cut back on carbohydrates.    Increase physical activity as tolerated. - Vitamin B12 - Methylmalonic Acid   , FNP    THE PATIENT IS ENCOURAGED TO PRACTICE SOCIAL DISTANCING DUE TO THE COVID-19 PANDEMIC.

## 2019-06-09 LAB — VITAMIN D 25 HYDROXY (VIT D DEFICIENCY, FRACTURES): Vit D, 25-Hydroxy: 19.6 ng/mL — ABNORMAL LOW (ref 30.0–100.0)

## 2019-06-09 LAB — T3, FREE: T3, Free: 2.7 pg/mL (ref 2.0–4.4)

## 2019-06-09 LAB — METHYLMALONIC ACID, SERUM: Methylmalonic Acid: 164 nmol/L (ref 0–378)

## 2019-06-09 LAB — TSH: TSH: 2.24 u[IU]/mL (ref 0.450–4.500)

## 2019-06-09 LAB — T4: T4, Total: 10.1 ug/dL (ref 4.5–12.0)

## 2019-06-09 LAB — VITAMIN B12: Vitamin B-12: 347 pg/mL (ref 232–1245)

## 2019-06-10 ENCOUNTER — Other Ambulatory Visit: Payer: Self-pay | Admitting: Nurse Practitioner

## 2019-06-10 DIAGNOSIS — E559 Vitamin D deficiency, unspecified: Secondary | ICD-10-CM

## 2019-06-10 MED ORDER — VITAMIN D (ERGOCALCIFEROL) 1.25 MG (50000 UNIT) PO CAPS: 50000.0000 [IU] | ORAL_CAPSULE | ORAL | 0 refills | Status: DC

## 2019-06-13 ENCOUNTER — Encounter: Payer: Self-pay | Admitting: Nurse Practitioner

## 2019-06-13 ENCOUNTER — Other Ambulatory Visit: Payer: Self-pay

## 2019-06-13 ENCOUNTER — Ambulatory Visit: Payer: BC Managed Care – PPO | Admitting: Internal Medicine

## 2019-06-13 ENCOUNTER — Ambulatory Visit: Payer: BC Managed Care – PPO | Admitting: Nurse Practitioner

## 2019-06-13 VITALS — BP 100/76 | HR 105 | Temp 98.0°F | Ht 66.0 in | Wt 189.2 lb

## 2019-06-13 DIAGNOSIS — R1010 Upper abdominal pain, unspecified: Secondary | ICD-10-CM

## 2019-06-13 DIAGNOSIS — Z8719 Personal history of other diseases of the digestive system: Secondary | ICD-10-CM | POA: Diagnosis not present

## 2019-06-13 MED ORDER — TRAMADOL HCL 50 MG PO TABS: 50.0000 mg | ORAL_TABLET | Freq: Four times a day (QID) | ORAL | 0 refills | Status: DC | PRN

## 2019-06-13 NOTE — Progress Notes (Signed)
Subjective:     Patient ID: Dana Martin , female    DOB: 1965-11-24 , 53 y.o.   MRN: [id number]   Chief Complaint  Patient presents with  . Abdominal Pain    patient stated her pain started last week, she hasnt ate much for the past coupleof days.    HPI  She reports she ate heavier food on Thursday and Friday.  From right upper quadrant radiating around back.  Piercing and burning with a full feeling.    Abdominal Pain This is a new problem. Episode onset: Saturday symptoms started. The onset quality is gradual. The problem occurs constantly. The problem has been gradually worsening. The quality of the pain is burning, a sensation of fullness, colicky and sharp. The abdominal pain radiates to the RUQ and back. Pertinent negatives include no anorexia, constipation, diarrhea, fever, headaches, myalgias or vomiting. The pain is aggravated by eating. Treatments tried: ibuprofen last 5 hours. Her past medical history is significant for pancreatitis. There is no history of abdominal surgery or colon cancer.     Past Medical History:  Diagnosis Date  . COPD (chronic obstructive pulmonary disease) ([place])    denies SOB with daily activities  . Full dentures   . GERD (gastroesophageal reflux disease)   . History of pancreatitis   . Hypothyroidism   . Nasal sinus congestion 05/08/2014   with sinus drainage  . PVC (premature ventricular contraction) 07/07/2017  . Stress incontinence   . Symptomatic cholelithiasis 05/2014  . Varicose veins of bilateral lower extremities with pain      Family History  Problem Relation Age of Onset  . Hypertension Mother   . Atrial fibrillation Mother   . Heart attack Mother   . Hypertension Sister   . Hypertension Brother   . Hypertension Daughter   . Lung cancer Brother   . Diabetes Sister      Current Outpatient Medications:  .  albuterol (PROVENTIL HFA;VENTOLIN HFA) 108 (90 Base) MCG/ACT inhaler, Inhale 2 puffs into the lungs every 6 (six)  hours as needed for wheezing or shortness of breath., Disp: , Rfl:  .  diphenhydrAMINE (BENADRYL) 25 MG tablet, Take 12.5 mg by mouth daily as needed for itching (allergic reaction)., Disp: , Rfl:  .  famotidine (PEPCID) 20 MG tablet, Take 20 mg by mouth 2 (two) times daily., Disp: , Rfl:  .  Ibuprofen (ADVIL) 200 MG CAPS, Take 400 mg by mouth 2 (two) times daily., Disp: , Rfl:  .  levothyroxine (SYNTHROID) 175 MCG tablet, Take 1 tablet (175 mcg total) by mouth daily., Disp: 90 tablet, Rfl: 1 .  Multiple Vitamin (MULTIVITAMIN) tablet, Take 1 tablet by mouth daily., Disp: , Rfl:  .  Polyethyl Glycol-Propyl Glycol (SYSTANE ULTRA) 0.4-0.3 % SOLN, Place 1 drop into both eyes 5 (five) times daily as needed (dry eyes/irriation)., Disp: , Rfl:  .  Vitamin D, Ergocalciferol, (DRISDOL) 1.25 MG (50000 UT) CAPS capsule, Take 1 capsule (50,000 Units total) by mouth every 7 (seven) days., Disp: 24 capsule, Rfl: 0  Current Facility-Administered Medications:  .  ipratropium-albuterol (DUONEB) 0.5-2.5 (3) MG/3ML nebulizer solution 3 mL, 3 mL, Nebulization, Once, Rodriguez-Southworth, Sunday Spillers, PA-C   Allergies  Allergen Reactions  . Fish Allergy Hives  . Gadolinium Derivatives Hives, Itching and Rash    Pt stated she felt itching and burning in her throat immediately following the injection.  . Multihance [Gadobenate] Hives, Itching and Rash    Itching and burning in her throat. Hives and  itching of her skin.  . Omnipaque [Iohexol] Hives  . Other Nausea And Vomiting    ALL NARCOTICS  . Peanut-Containing Drug Products Hives  . Shellfish Allergy Hives  . Codeine Nausea And Vomiting  . Oxycodone Nausea And Vomiting     Review of Systems  Constitutional: Negative for fatigue and fever.  Respiratory: Negative.   Cardiovascular: Negative.  Negative for chest pain, palpitations and leg swelling.  Gastrointestinal: Positive for abdominal pain. Negative for anorexia, constipation, diarrhea and vomiting.   Musculoskeletal: Negative for myalgias.  Skin: Negative.   Neurological: Negative for dizziness and headaches.     Today's Vitals   06/13/19 1012 06/13/19 1015  BP: 108/80 100/76  Pulse: (!) 105   Temp: 98 F (36.7 C)   TempSrc: Oral   Weight: 189 lb 3.2 oz (85.8 kg)   Height: _0  (1.676 m)   PainSc: 10-Worst pain ever   PainLoc: Abdomen    Body mass index is 30.54 kg/m.   Objective:  Physical Exam Constitutional:      Appearance: She is well-developed.  Abdominal:     General: Bowel sounds are normal.     Palpations: Abdomen is soft.     Tenderness: There is abdominal tenderness in the right upper quadrant.  Neurological:     Mental Status: She is alert.         Assessment And Plan:     1. Pain of upper abdomen  Previous history of pancreatitis  Will draw amylase/lipase, BMP and CBC  Limit supply of tramadol sent  - traMADol (ULTRAM) 50 MG tablet; Take 1 tablet (50 mg total) by mouth every 6 (six) hours as needed.  Dispense: 20 tablet; Refill: 0 - Amylase - Lipase - BMP8+eGFR - CBC without diff  [name], FNP    THE PATIENT IS ENCOURAGED TO PRACTICE SOCIAL DISTANCING DUE TO THE COVID-19 PANDEMIC.

## 2019-06-13 NOTE — Patient Instructions (Signed)
Pancreatitis Eating Plan Pancreatitis is when your pancreas becomes irritated and swollen (inflamed). The pancreas is a small organ located behind your stomach. It helps your body digest food and regulate your blood sugar. Pancreatitis can affect how your body digests food, especially foods with fat. You may also have other symptoms such as abdominal pain or nausea. When you have pancreatitis, following a low-fat eating plan may help you manage symptoms and recover more quickly. Work with your health care provider or a diet and nutrition specialist (dietitian) to create an eating plan that is right for you. What are tips for following this plan? Reading food labels Use the information on food labels to help keep track of how much fat you eat:  Check the serving size.  Look for the amount of total fat in grams (g) in one serving. ? Low-fat foods have 3 g of fat or less per serving. ? Fat-free foods have 0.5 g of fat or less per serving.  Keep track of how much fat you eat based on how many servings you eat. ? For example, if you eat two servings, the amount of fat you eat will be two times what is listed on the label. Shopping   Buy low-fat or nonfat foods, such as: ? Fresh, frozen, or canned fruits and vegetables. ? Grains, including pasta, bread, and rice. ? Lean meat, poultry, fish, and other protein foods. ? Low-fat or nonfat dairy.  Avoid buying bakery products and other sweets made with whole milk, butter, and eggs.  Avoid buying snack foods with added fat, such as anything with butter or cheese flavoring. Cooking  Remove skin from poultry, and remove extra fat from meat.  Limit the amount of fat and oil you use to 6 teaspoons or less per day.  Cook using low-fat methods, such as boiling, broiling, grilling, steaming, or baking.  Use spray oil to cook. Add fat-free chicken broth to add flavor and moisture.  Avoid adding cream to thicken soups or sauces. Use other thickeners  such as corn starch or tomato paste. Meal planning   Eat a low-fat diet as told by your dietitian. For most people, this means having no more than 55-65 grams of fat each day.  Eat small, frequent meals throughout the day. For example, you may have 5-6 small meals instead of 3 large meals.  Drink enough fluid to keep your urine pale yellow.  Do not drink alcohol. Talk to your health care provider if you need help stopping.  Limit how much caffeine you have, including black coffee, black and green tea, caffeinated soft drinks, and energy drinks. General information  Let your health care provider or dietitian know if you have unplanned weight loss on this eating plan.  You may be instructed to follow a clear liquid diet during a flare of symptoms. Talk with your health care provider about how to manage your diet during symptoms of a flare.  Take any vitamins or supplements as told by your health care provider.  Work with a dietitian, especially if you have other conditions such as obesity or diabetes mellitus. What foods should I avoid? Fruits Fried fruits. Fruits served with butter or cream. Vegetables Fried vegetables. Vegetables cooked with butter, cheese, or cream. Grains Biscuits, waffles, donuts, pastries, and croissants. Pies and cookies. Butter-flavored popcorn. Regular crackers. Meats and other protein foods Fatty cuts of meat. Poultry with skin. Organ meats. Bacon, sausage, and cold cuts. Whole eggs. Nuts and nut butters. Dairy Whole and   2% milk. Whole milk yogurt. Whole milk ice cream. Cream and half-and-half. Cream cheese. Sour cream. Cheese. Beverages Wine, beer, and liquor. The items listed above may not be a complete list of foods and beverages to avoid. Contact a dietitian for more information. Summary  Pancreatitis can affect how your body digests food, especially foods with fat.  When you have pancreatitis, it is recommended that you follow a low-fat eating  plan to help you recover more quickly and manage symptoms. For most people, this means limiting fat to no more than 55-65 grams per day.  Do not drink alcohol. Limit the amount of caffeine you have, and drink enough fluid to keep your urine pale yellow. This information is not intended to replace advice given to you by your health care provider. Make sure you discuss any questions you have with your health care provider. Document Released: 10/31/2017 Document Revised: 11/15/2018 Document Reviewed: 10/31/2017 [employer] Patient Education  2020 [employer]  

## 2019-06-14 LAB — AMYLASE: Amylase: 97 U/L (ref 31–110)

## 2019-06-14 LAB — CBC
Hematocrit: 38.1 % (ref 34.0–46.6)
Hemoglobin: 12.5 g/dL (ref 11.1–15.9)
MCH: 26.8 pg (ref 26.6–33.0)
MCHC: 32.8 g/dL (ref 31.5–35.7)
MCV: 82 fL (ref 79–97)
Platelets: 273 10*3/uL (ref 150–450)
RBC: 4.67 x10E6/uL (ref 3.77–5.28)
RDW: 15.3 % (ref 11.7–15.4)
WBC: 13 10*3/uL — ABNORMAL HIGH (ref 3.4–10.8)

## 2019-06-14 LAB — LIPASE: Lipase: 106 U/L — ABNORMAL HIGH (ref 14–72)

## 2019-06-14 LAB — BMP8+EGFR
BUN/Creatinine Ratio: 13 (ref 9–23)
BUN: 11 mg/dL (ref 6–24)
CO2: 21 mmol/L (ref 20–29)
Calcium: 9.5 mg/dL (ref 8.7–10.2)
Chloride: 98 mmol/L (ref 96–106)
Creatinine, Ser: 0.85 mg/dL (ref 0.57–1.00)
GFR calc Af Amer: 89 mL/min/{1.73_m2} (ref >59)
GFR calc non Af Amer: 77 mL/min/{1.73_m2} (ref >59)
Glucose: 81 mg/dL (ref 65–99)
Potassium: 3.7 mmol/L (ref 3.5–5.2)
Sodium: 136 mmol/L (ref 134–144)

## 2019-06-17 NOTE — Progress Notes (Signed)
The antibiotic should help.

## 2019-06-19 ENCOUNTER — Encounter: Payer: Self-pay | Admitting: Nurse Practitioner

## 2019-06-19 ENCOUNTER — Other Ambulatory Visit: Payer: Self-pay

## 2019-06-19 ENCOUNTER — Ambulatory Visit: Payer: BC Managed Care – PPO | Admitting: Nurse Practitioner

## 2019-06-19 VITALS — BP 100/80 | HR 103 | Temp 98.2°F | Ht 66.0 in | Wt 183.0 lb

## 2019-06-19 DIAGNOSIS — K8500 Idiopathic acute pancreatitis without necrosis or infection: Secondary | ICD-10-CM

## 2019-06-19 DIAGNOSIS — K5900 Constipation, unspecified: Secondary | ICD-10-CM | POA: Diagnosis not present

## 2019-06-19 DIAGNOSIS — E538 Deficiency of other specified B group vitamins: Secondary | ICD-10-CM | POA: Diagnosis not present

## 2019-06-19 MED ORDER — CYANOCOBALAMIN 1000 MCG/ML IJ SOLN
1000.0000 ug | Freq: Once | INTRAMUSCULAR | Status: AC
  Administered 2019-06-19: 1000 ug via INTRAMUSCULAR

## 2019-06-19 MED ORDER — CIPROFLOXACIN HCL 500 MG PO TABS: 500.0000 mg | ORAL_TABLET | Freq: Two times a day (BID) | ORAL | 0 refills | Status: AC

## 2019-06-19 NOTE — Progress Notes (Signed)
Subjective:     Patient ID: Dana Martin , female    DOB: [birth year] , 53 y.o.   MRN: [id number]   Chief Complaint  Patient presents with  . B12 Injection    HPI  Here today for Vitamin B12 injection.  She is feeling a little better with her abdominal pain.  She is going to try egg white and toast.   She has been eating yogurt and applesauce over the last 2 weeks.    Wt Readings from Last 3 Encounters: 06/19/19 : 183 lb (83 kg) 06/13/19 : 189 lb 3.2 oz (85.8 kg) 06/05/19 : 196 lb 6.4 oz (89.1 kg)     Past Medical History:  Diagnosis Date  . COPD (chronic obstructive pulmonary disease) (HCC)    denies SOB with daily activities  . Full dentures   . GERD (gastroesophageal reflux disease)   . History of pancreatitis   . Hypothyroidism   . Nasal sinus congestion 05/08/2014   with sinus drainage  . PVC (premature ventricular contraction) 07/07/2017  . Stress incontinence   . Symptomatic cholelithiasis 05/2014  . Varicose veins of bilateral lower extremities with pain      Family History  Problem Relation Age of Onset  . Hypertension Mother   . Atrial fibrillation Mother   . Heart attack Mother   . Hypertension Sister   . Hypertension Brother   . Hypertension Daughter   . Lung cancer Brother   . Diabetes Sister      Current Outpatient Medications:  .  albuterol (PROVENTIL HFA;VENTOLIN HFA) 108 (90 Base) MCG/ACT inhaler, Inhale 2 puffs into the lungs every 6 (six) hours as needed for wheezing or shortness of breath., Disp: , Rfl:  .  diphenhydrAMINE (BENADRYL) 25 MG tablet, Take 12.5 mg by mouth daily as needed for itching (allergic reaction)., Disp: , Rfl:  .  famotidine (PEPCID) 20 MG tablet, Take 20 mg by mouth 2 (two) times daily., Disp: , Rfl:  .  Ibuprofen (ADVIL) 200 MG CAPS, Take 400 mg by mouth 2 (two) times daily., Disp: , Rfl:  .  levothyroxine (SYNTHROID) 175 MCG tablet, Take 1 tablet (175 mcg total) by mouth daily., Disp: 90 tablet, Rfl: 1 .  Multiple  Vitamin (MULTIVITAMIN) tablet, Take 1 tablet by mouth daily., Disp: , Rfl:  .  Polyethyl Glycol-Propyl Glycol (SYSTANE ULTRA) 0.4-0.3 % SOLN, Place 1 drop into both eyes 5 (five) times daily as needed (dry eyes/irriation)., Disp: , Rfl:  .  traMADol (ULTRAM) 50 MG tablet, Take 1 tablet (50 mg total) by mouth every 6 (six) hours as needed., Disp: 20 tablet, Rfl: 0 .  Vitamin D, Ergocalciferol, (DRISDOL) 1.25 MG (50000 UT) CAPS capsule, Take 1 capsule (50,000 Units total) by mouth every 7 (seven) days., Disp: 24 capsule, Rfl: 0  Current Facility-Administered Medications:  .  cyanocobalamin ((VITAMIN B-12)) injection 1,000 mcg, 1,000 mcg, Intramuscular, Once, Dana Felts, FNP .  ipratropium-albuterol (DUONEB) 0.5-2.5 (3) MG/3ML nebulizer solution 3 mL, 3 mL, Nebulization, Once, Dana Martin, Dana Elm, PA-C   Allergies  Allergen Reactions  . Fish Allergy Hives  . Gadolinium Derivatives Hives, Itching and Rash    Pt stated she felt itching and burning in her throat immediately following the injection.  . Multihance [Gadobenate] Hives, Itching and Rash    Itching and burning in her throat. Hives and itching of her skin.  . Omnipaque [Iohexol] Hives  . Other Nausea And Vomiting    ALL NARCOTICS  . Peanut-Containing Drug Products Hives  .  Shellfish Allergy Hives  . Codeine Nausea And Vomiting  . Oxycodone Nausea And Vomiting     Review of Systems  Constitutional: Negative.   Respiratory: Negative.   Cardiovascular: Negative for chest pain, palpitations and leg swelling.  Neurological: Negative for dizziness and headaches.  Psychiatric/Behavioral: Negative.      Today's Vitals   06/19/19 0947  BP: 100/80  Pulse: (!) 103  Temp: 98.2 F (36.8 C)  TempSrc: Oral  Weight: 183 lb (83 kg)  Height: 5\' 6"  (1.676 m)  PainSc: 0-No pain   Body mass index is 29.54 kg/m.   Objective:  Physical Exam Constitutional:      Appearance: Normal appearance.  Cardiovascular:     Rate and  Rhythm: Normal rate and regular rhythm.     Pulses: Normal pulses.     Heart sounds: Normal heart sounds. No murmur.  Pulmonary:     Effort: Pulmonary effort is normal. No respiratory distress.     Breath sounds: Normal breath sounds.  Skin:    General: Skin is warm and dry.  Neurological:     General: No focal deficit present.     Mental Status: She is alert and oriented to person, place, and time.         Assessment And Plan:    1. Vitamin B12 deficiency  Here today for 1 of 4 weekly vitamin B12 injection - cyanocobalamin ((VITAMIN B-12)) injection 1,000 mcg  2. Idiopathic acute pancreatitis, unspecified complication status  White count was slightly elevated last week will treat with antibiotic cipro BID for 10 days  Pain is improving  Advised to slowly incorporate foods starting with bland diet - ciprofloxacin (CIPRO) 500 MG tablet; Take 1 tablet (500 mg total) by mouth 2 (two) times daily for 10 days.  Dispense: 20 tablet; Refill: 0  3. Constipation, unspecified constipation type  Encouraged to increase water intake and take a probiotic daily  Reports stool softners cramps her stomach      Dana Brine, FNP    THE PATIENT IS ENCOURAGED TO PRACTICE SOCIAL DISTANCING DUE TO THE COVID-19 PANDEMIC.

## 2019-06-19 NOTE — Patient Instructions (Signed)

## 2019-06-26 ENCOUNTER — Other Ambulatory Visit: Payer: Self-pay

## 2019-06-26 ENCOUNTER — Ambulatory Visit (INDEPENDENT_AMBULATORY_CARE_PROVIDER_SITE_OTHER): Payer: BC Managed Care – PPO | Admitting: Nurse Practitioner

## 2019-06-26 ENCOUNTER — Encounter: Payer: Self-pay | Admitting: Nurse Practitioner

## 2019-06-26 VITALS — BP 120/82 | HR 79 | Temp 98.4°F | Wt 188.0 lb

## 2019-06-26 DIAGNOSIS — E538 Deficiency of other specified B group vitamins: Secondary | ICD-10-CM

## 2019-06-26 DIAGNOSIS — K8500 Idiopathic acute pancreatitis without necrosis or infection: Secondary | ICD-10-CM | POA: Diagnosis not present

## 2019-06-26 HISTORY — DX: Deficiency of other specified B group vitamins: E53.8

## 2019-06-26 MED ORDER — CYANOCOBALAMIN 1000 MCG/ML IJ SOLN
1000.0000 ug | Freq: Once | INTRAMUSCULAR | Status: AC
  Administered 2019-06-26: 1000 ug via INTRAMUSCULAR

## 2019-06-26 NOTE — Progress Notes (Signed)
Subjective:     Patient ID: Dana Martin , female    DOB: 08-Oct-1965 , 53 y.o.   MRN: [id number]   Chief Complaint  Patient presents with  . B12 Injection    HPI  Here today for Vitamin B12 injection 2/4. Her energy level is slightly better but she thinks she is She is feeling better with her abdominal pain.    Wt Readings from Last 3 Encounters: 06/26/19 : 188 lb (85.3 kg) 06/19/19 : 183 lb (83 kg) 06/13/19 : 189 lb 3.2 oz (85.8 kg)       Past Medical History:  Diagnosis Date  . COPD (chronic obstructive pulmonary disease) (HCC)    denies SOB with daily activities  . Full dentures   . GERD (gastroesophageal reflux disease)   . History of pancreatitis   . Hypothyroidism   . Nasal sinus congestion 05/08/2014   with sinus drainage  . PVC (premature ventricular contraction) 07/07/2017  . Stress incontinence   . Symptomatic cholelithiasis 05/2014  . Varicose veins of bilateral lower extremities with pain      Family History  Problem Relation Age of Onset  . Hypertension Mother   . Atrial fibrillation Mother   . Heart attack Mother   . Hypertension Sister   . Hypertension Brother   . Hypertension Daughter   . Lung cancer Brother   . Diabetes Sister      Current Outpatient Medications:  .  albuterol (PROVENTIL HFA;VENTOLIN HFA) 108 (90 Base) MCG/ACT inhaler, Inhale 2 puffs into the lungs every 6 (six) hours as needed for wheezing or shortness of breath., Disp: , Rfl:  .  diphenhydrAMINE (BENADRYL) 25 MG tablet, Take 12.5 mg by mouth daily as needed for itching (allergic reaction)., Disp: , Rfl:  .  famotidine (PEPCID) 20 MG tablet, Take 20 mg by mouth 2 (two) times daily., Disp: , Rfl:  .  Ibuprofen (ADVIL) 200 MG CAPS, Take 400 mg by mouth 2 (two) times daily., Disp: , Rfl:  .  levothyroxine (SYNTHROID) 175 MCG tablet, Take 1 tablet (175 mcg total) by mouth daily., Disp: 90 tablet, Rfl: 1 .  Multiple Vitamin (MULTIVITAMIN) tablet, Take 1 tablet by mouth daily.,  Disp: , Rfl:  .  Polyethyl Glycol-Propyl Glycol (SYSTANE ULTRA) 0.4-0.3 % SOLN, Place 1 drop into both eyes 5 (five) times daily as needed (dry eyes/irriation)., Disp: , Rfl:  .  traMADol (ULTRAM) 50 MG tablet, Take 1 tablet (50 mg total) by mouth every 6 (six) hours as needed., Disp: 20 tablet, Rfl: 0 .  Vitamin D, Ergocalciferol, (DRISDOL) 1.25 MG (50000 UT) CAPS capsule, Take 1 capsule (50,000 Units total) by mouth every 7 (seven) days., Disp: 24 capsule, Rfl: 0 .  ciprofloxacin (CIPRO) 500 MG tablet, Take 1 tablet (500 mg total) by mouth 2 (two) times daily for 10 days. (Patient not taking: Reported on 06/26/2019), Disp: 20 tablet, Rfl: 0  Current Facility-Administered Medications:  .  ipratropium-albuterol (DUONEB) 0.5-2.5 (3) MG/3ML nebulizer solution 3 mL, 3 mL, Nebulization, Once, Rodriguez-Southworth, Nettie Elm, PA-C   Allergies  Allergen Reactions  . Fish Allergy Hives  . Gadolinium Derivatives Hives, Itching and Rash    Pt stated she felt itching and burning in her throat immediately following the injection.  . Multihance [Gadobenate] Hives, Itching and Rash    Itching and burning in her throat. Hives and itching of her skin.  . Omnipaque [Iohexol] Hives  . Other Nausea And Vomiting    ALL NARCOTICS  . Peanut-Containing Drug  Products Hives  . Shellfish Allergy Hives  . Codeine Nausea And Vomiting  . Oxycodone Nausea And Vomiting     Review of Systems  Constitutional: Positive for fatigue.  Respiratory: Negative.   Cardiovascular: Negative for chest pain, palpitations and leg swelling.  Neurological: Negative for dizziness and headaches.  Psychiatric/Behavioral: Negative.      Today's Vitals   06/26/19 1008  BP: 120/82  Pulse: 79  Temp: 98.4 F (36.9 C)  TempSrc: Oral  Weight: 188 lb (85.3 kg)   Body mass index is 30.34 kg/m.   Objective:  Physical Exam Constitutional:      General: She is not in acute distress.    Appearance: Normal appearance.   Cardiovascular:     Rate and Rhythm: Normal rate and regular rhythm.     Pulses: Normal pulses.     Heart sounds: Normal heart sounds. No murmur.  Pulmonary:     Effort: Pulmonary effort is normal. No respiratory distress.     Breath sounds: Normal breath sounds.  Skin:    General: Skin is warm and dry.  Neurological:     General: No focal deficit present.     Mental Status: She is alert and oriented to person, place, and time.         Assessment And Plan:    1. Vitamin B12 deficiency  Here today for 2 of 4 weekly vitamin B12 injection - cyanocobalamin ((VITAMIN B-12)) injection 1,000 mcg  2. Idiopathic acute pancreatitis, unspecified complication status  She is feeling better continue monitoring your diet with low fat diet.    She has had a increase in her weight by 5 lbs.             [name], FNP    THE PATIENT IS ENCOURAGED TO PRACTICE SOCIAL DISTANCING DUE TO THE COVID-19 PANDEMIC.

## 2019-07-03 ENCOUNTER — Encounter: Payer: Self-pay | Admitting: Nurse Practitioner

## 2019-07-03 ENCOUNTER — Other Ambulatory Visit: Payer: Self-pay

## 2019-07-03 ENCOUNTER — Ambulatory Visit (INDEPENDENT_AMBULATORY_CARE_PROVIDER_SITE_OTHER): Payer: BC Managed Care – PPO | Admitting: Nurse Practitioner

## 2019-07-03 VITALS — BP 118/82 | HR 85 | Temp 97.8°F | Ht 65.8 in | Wt 184.0 lb

## 2019-07-03 DIAGNOSIS — E538 Deficiency of other specified B group vitamins: Secondary | ICD-10-CM

## 2019-07-03 DIAGNOSIS — K8500 Idiopathic acute pancreatitis without necrosis or infection: Secondary | ICD-10-CM

## 2019-07-03 MED ORDER — CYANOCOBALAMIN 1000 MCG/ML IJ SOLN
1000.0000 ug | Freq: Once | INTRAMUSCULAR | Status: AC
  Administered 2019-07-03: 1000 ug via INTRAMUSCULAR

## 2019-07-03 NOTE — Progress Notes (Signed)
Subjective:     Patient ID: Dana Martin , female    DOB: 1965-10-17 , 53 y.o.   MRN: [id number]   Chief Complaint  Patient presents with  . B12 Injection    HPI  Here today for Vitamin B12 injection 3/4.  She does not feel her energy level is any better.      Wt Readings from Last 3 Encounters: 07/03/19 : 184 lb (83.5 kg) 06/26/19 : 188 lb (85.3 kg) 06/19/19 : 183 lb (83 kg)        Past Medical History:  Diagnosis Date  . COPD (chronic obstructive pulmonary disease) (HCC)    denies SOB with daily activities  . Full dentures   . GERD (gastroesophageal reflux disease)   . History of pancreatitis   . Hypothyroidism   . Nasal sinus congestion 05/08/2014   with sinus drainage  . PVC (premature ventricular contraction) 07/07/2017  . Stress incontinence   . Symptomatic cholelithiasis 05/2014  . Varicose veins of bilateral lower extremities with pain      Family History  Problem Relation Age of Onset  . Hypertension Mother   . Atrial fibrillation Mother   . Heart attack Mother   . Hypertension Sister   . Hypertension Brother   . Hypertension Daughter   . Lung cancer Brother   . Diabetes Sister      Current Outpatient Medications:  .  albuterol (PROVENTIL HFA;VENTOLIN HFA) 108 (90 Base) MCG/ACT inhaler, Inhale 2 puffs into the lungs every 6 (six) hours as needed for wheezing or shortness of breath., Disp: , Rfl:  .  diphenhydrAMINE (BENADRYL) 25 MG tablet, Take 12.5 mg by mouth daily as needed for itching (allergic reaction)., Disp: , Rfl:  .  famotidine (PEPCID) 20 MG tablet, Take 20 mg by mouth 2 (two) times daily., Disp: , Rfl:  .  Ibuprofen (ADVIL) 200 MG CAPS, Take 400 mg by mouth 2 (two) times daily., Disp: , Rfl:  .  levothyroxine (SYNTHROID) 175 MCG tablet, Take 1 tablet (175 mcg total) by mouth daily., Disp: 90 tablet, Rfl: 1 .  Multiple Vitamin (MULTIVITAMIN) tablet, Take 1 tablet by mouth daily., Disp: , Rfl:  .  Polyethyl Glycol-Propyl Glycol (SYSTANE  ULTRA) 0.4-0.3 % SOLN, Place 1 drop into both eyes 5 (five) times daily as needed (dry eyes/irriation)., Disp: , Rfl:  .  traMADol (ULTRAM) 50 MG tablet, Take 1 tablet (50 mg total) by mouth every 6 (six) hours as needed., Disp: 20 tablet, Rfl: 0 .  Vitamin D, Ergocalciferol, (DRISDOL) 1.25 MG (50000 UT) CAPS capsule, Take 1 capsule (50,000 Units total) by mouth every 7 (seven) days., Disp: 24 capsule, Rfl: 0  Current Facility-Administered Medications:  .  ipratropium-albuterol (DUONEB) 0.5-2.5 (3) MG/3ML nebulizer solution 3 mL, 3 mL, Nebulization, Once, Rodriguez-Southworth, Nettie Elm, PA-C   Allergies  Allergen Reactions  . Fish Allergy Hives  . Gadolinium Derivatives Hives, Itching and Rash    Pt stated she felt itching and burning in her throat immediately following the injection.  . Multihance [Gadobenate] Hives, Itching and Rash    Itching and burning in her throat. Hives and itching of her skin.  . Omnipaque [Iohexol] Hives  . Other Nausea And Vomiting    ALL NARCOTICS  . Peanut-Containing Drug Products Hives  . Shellfish Allergy Hives  . Codeine Nausea And Vomiting  . Oxycodone Nausea And Vomiting     Review of Systems  Constitutional: Positive for fatigue.  Respiratory: Negative.   Cardiovascular: Negative for chest  pain, palpitations and leg swelling.  Neurological: Negative for dizziness and headaches.  Psychiatric/Behavioral: Negative.      Today's Vitals   07/03/19 1212  BP: 118/82  Pulse: 85  Temp: 97.8 F (36.6 C)  TempSrc: Oral  Weight: 184 lb (83.5 kg)  Height: 5' 5.8" (1.671 m)   Body mass index is 29.88 kg/m.   Objective:  Physical Exam Constitutional:      General: She is not in acute distress.    Appearance: Normal appearance.  Cardiovascular:     Rate and Rhythm: Normal rate and regular rhythm.     Pulses: Normal pulses.     Heart sounds: Normal heart sounds. No murmur.  Pulmonary:     Effort: Pulmonary effort is normal. No respiratory  distress.     Breath sounds: Normal breath sounds.  Abdominal:     Palpations: Abdomen is soft.  Skin:    General: Skin is warm and dry.  Neurological:     General: No focal deficit present.     Mental Status: She is alert and oriented to person, place, and time.         Assessment And Plan:    1. Vitamin B12 deficiency  Here today for 3 of 4 weekly vitamin B12 injection  Will recheck her vitamin b12 levels at next visit prior to injection - cyanocobalamin ((VITAMIN B-12)) injection 1,000 mcg  2. Idiopathic acute pancreatitis, unspecified complication status  She still feels she is not back to 100% and continues to have issues    I have referred her to Dr. Collene Mares for continued abdominal discomfort even after treatement with antibiotics, taking probiotics           [name], FNP    THE PATIENT IS ENCOURAGED TO PRACTICE SOCIAL DISTANCING DUE TO THE COVID-19 PANDEMIC.

## 2019-07-09 ENCOUNTER — Other Ambulatory Visit: Payer: BC Managed Care – PPO

## 2019-07-10 ENCOUNTER — Other Ambulatory Visit: Payer: Self-pay

## 2019-07-10 ENCOUNTER — Encounter: Payer: Self-pay | Admitting: Nurse Practitioner

## 2019-07-10 ENCOUNTER — Ambulatory Visit (INDEPENDENT_AMBULATORY_CARE_PROVIDER_SITE_OTHER): Payer: BC Managed Care – PPO | Admitting: Nurse Practitioner

## 2019-07-10 VITALS — BP 124/86 | HR 82 | Temp 98.0°F | Ht 65.8 in | Wt 186.0 lb

## 2019-07-10 DIAGNOSIS — I83813 Varicose veins of bilateral lower extremities with pain: Secondary | ICD-10-CM

## 2019-07-10 DIAGNOSIS — E538 Deficiency of other specified B group vitamins: Secondary | ICD-10-CM

## 2019-07-10 MED ORDER — CYANOCOBALAMIN 1000 MCG/ML IJ SOLN
1000.0000 ug | Freq: Once | INTRAMUSCULAR | Status: AC
  Administered 2019-07-10: 1000 ug via INTRAMUSCULAR

## 2019-07-10 NOTE — Progress Notes (Signed)
Subjective:     Patient ID: Dana Martin , female    DOB: March 22, 1966 , 53 y.o.   MRN: [id number]   Chief Complaint  Patient presents with  . B12 Injection    HPI  Here today for Vitamin B12 injection 4/4.  "I feel like my fatigue is getting better"  Wt Readings from Last 3 Encounters: 07/10/19 : 186 lb (84.4 kg) 07/03/19 : 184 lb (83.5 kg) 06/26/19 : 188 lb (85.3 kg)      Past Medical History:  Diagnosis Date  . COPD (chronic obstructive pulmonary disease) (HCC)    denies SOB with daily activities  . Full dentures   . GERD (gastroesophageal reflux disease)   . History of pancreatitis   . Hypothyroidism   . Nasal sinus congestion 05/08/2014   with sinus drainage  . PVC (premature ventricular contraction) 07/07/2017  . Stress incontinence   . Symptomatic cholelithiasis 05/2014  . Varicose veins of bilateral lower extremities with pain      Family History  Problem Relation Age of Onset  . Hypertension Mother   . Atrial fibrillation Mother   . Heart attack Mother   . Hypertension Sister   . Hypertension Brother   . Hypertension Daughter   . Lung cancer Brother   . Diabetes Sister      Current Outpatient Medications:  .  albuterol (PROVENTIL HFA;VENTOLIN HFA) 108 (90 Base) MCG/ACT inhaler, Inhale 2 puffs into the lungs every 6 (six) hours as needed for wheezing or shortness of breath., Disp: , Rfl:  .  diphenhydrAMINE (BENADRYL) 25 MG tablet, Take 12.5 mg by mouth daily as needed for itching (allergic reaction)., Disp: , Rfl:  .  famotidine (PEPCID) 20 MG tablet, Take 20 mg by mouth 2 (two) times daily., Disp: , Rfl:  .  Ibuprofen (ADVIL) 200 MG CAPS, Take 400 mg by mouth 2 (two) times daily., Disp: , Rfl:  .  levothyroxine (SYNTHROID) 175 MCG tablet, Take 1 tablet (175 mcg total) by mouth daily., Disp: 90 tablet, Rfl: 1 .  Multiple Vitamin (MULTIVITAMIN) tablet, Take 1 tablet by mouth daily., Disp: , Rfl:  .  Polyethyl Glycol-Propyl Glycol (SYSTANE ULTRA)  0.4-0.3 % SOLN, Place 1 drop into both eyes 5 (five) times daily as needed (dry eyes/irriation)., Disp: , Rfl:  .  traMADol (ULTRAM) 50 MG tablet, Take 1 tablet (50 mg total) by mouth every 6 (six) hours as needed., Disp: 20 tablet, Rfl: 0 .  Vitamin D, Ergocalciferol, (DRISDOL) 1.25 MG (50000 UT) CAPS capsule, Take 1 capsule (50,000 Units total) by mouth every 7 (seven) days., Disp: 24 capsule, Rfl: 0  Current Facility-Administered Medications:  .  ipratropium-albuterol (DUONEB) 0.5-2.5 (3) MG/3ML nebulizer solution 3 mL, 3 mL, Nebulization, Once, Rodriguez-Southworth, Nettie Elm, PA-C   Allergies  Allergen Reactions  . Fish Allergy Hives  . Gadolinium Derivatives Hives, Itching and Rash    Pt stated she felt itching and burning in her throat immediately following the injection.  . Multihance [Gadobenate] Hives, Itching and Rash    Itching and burning in her throat. Hives and itching of her skin.  . Omnipaque [Iohexol] Hives  . Other Nausea And Vomiting    ALL NARCOTICS  . Peanut-Containing Drug Products Hives  . Shellfish Allergy Hives  . Codeine Nausea And Vomiting  . Oxycodone Nausea And Vomiting     Review of Systems  Constitutional: Positive for fatigue.  Respiratory: Negative.   Cardiovascular: Negative for chest pain, palpitations and leg swelling.  Neurological: Negative  for dizziness and headaches.  Psychiatric/Behavioral: Negative.      Today's Vitals   07/10/19 0933  BP: 124/86  Pulse: 82  Temp: 98 F (36.7 C)  TempSrc: Oral  Weight: 186 lb (84.4 kg)  Height: 5' 5.8" (1.671 m)  PainSc: 0-No pain   Body mass index is 30.2 kg/m.   Objective:  Physical Exam Constitutional:      General: She is not in acute distress.    Appearance: Normal appearance.  Cardiovascular:     Rate and Rhythm: Normal rate and regular rhythm.     Pulses: Normal pulses.     Heart sounds: Normal heart sounds. No murmur.  Pulmonary:     Effort: Pulmonary effort is normal. No  respiratory distress.     Breath sounds: Normal breath sounds.  Abdominal:     Palpations: Abdomen is soft.  Skin:    General: Skin is warm and dry.     Comments: Spider veins to legs near knee area.   Neurological:     General: No focal deficit present.     Mental Status: She is alert and oriented to person, place, and time.         Assessment And Plan:    1. Vitamin B12 deficiency  Here today for 4 of 4 weekly vitamin B12 injection  Will check her vitamin B12 - cyanocobalamin ((VITAMIN B-12)) injection 1,000 mcg  2. Varicose veins of both lower extremities with pain  Continues to have pain at night to the top of her legs/thighs at night  Multiple spider varicose veins present  Recommend to wear support socks or compression shorts to go over thighs to see if this helps and encouraged to take Tylenol as needed vs ibuprofen due to risk for GI irritation, ulcers or bleeding.          [name], FNP    THE PATIENT IS ENCOURAGED TO PRACTICE SOCIAL DISTANCING DUE TO THE COVID-19 PANDEMIC.

## 2019-07-11 LAB — VITAMIN B12: Vitamin B-12: 792 pg/mL (ref 232–1245)

## 2019-07-29 ENCOUNTER — Other Ambulatory Visit: Payer: Self-pay | Admitting: Gastroenterology

## 2019-07-29 ENCOUNTER — Other Ambulatory Visit: Payer: BC Managed Care – PPO

## 2019-07-29 ENCOUNTER — Other Ambulatory Visit: Payer: Self-pay

## 2019-07-29 ENCOUNTER — Other Ambulatory Visit: Payer: Self-pay | Admitting: Nurse Practitioner

## 2019-07-29 ENCOUNTER — Other Ambulatory Visit (HOSPITAL_COMMUNITY): Payer: Self-pay | Admitting: Gastroenterology

## 2019-07-29 DIAGNOSIS — Z1211 Encounter for screening for malignant neoplasm of colon: Secondary | ICD-10-CM | POA: Diagnosis not present

## 2019-07-29 DIAGNOSIS — E538 Deficiency of other specified B group vitamins: Secondary | ICD-10-CM

## 2019-07-29 DIAGNOSIS — R1084 Generalized abdominal pain: Secondary | ICD-10-CM

## 2019-07-29 DIAGNOSIS — K8500 Idiopathic acute pancreatitis without necrosis or infection: Secondary | ICD-10-CM | POA: Diagnosis not present

## 2019-07-29 DIAGNOSIS — R140 Abdominal distension (gaseous): Secondary | ICD-10-CM | POA: Diagnosis not present

## 2019-07-30 ENCOUNTER — Emergency Department (HOSPITAL_COMMUNITY): Payer: BC Managed Care – PPO

## 2019-07-30 ENCOUNTER — Encounter (HOSPITAL_COMMUNITY): Payer: Self-pay | Admitting: Emergency Medicine

## 2019-07-30 ENCOUNTER — Other Ambulatory Visit: Payer: Self-pay

## 2019-07-30 ENCOUNTER — Emergency Department (HOSPITAL_COMMUNITY)
Admission: EM | Admit: 2019-07-30 | Discharge: 2019-07-30 | Disposition: A | Discharge status: Home / Self Care | Payer: BC Managed Care – PPO | Attending: Emergency Medicine | Admitting: Emergency Medicine

## 2019-07-30 DIAGNOSIS — Z87891 Personal history of nicotine dependence: Secondary | ICD-10-CM | POA: Diagnosis not present

## 2019-07-30 DIAGNOSIS — R112 Nausea with vomiting, unspecified: Secondary | ICD-10-CM | POA: Diagnosis not present

## 2019-07-30 DIAGNOSIS — Z79899 Other long term (current) drug therapy: Secondary | ICD-10-CM | POA: Diagnosis not present

## 2019-07-30 DIAGNOSIS — R109 Unspecified abdominal pain: Secondary | ICD-10-CM | POA: Diagnosis not present

## 2019-07-30 DIAGNOSIS — K2980 Duodenitis without bleeding: Secondary | ICD-10-CM | POA: Diagnosis not present

## 2019-07-30 DIAGNOSIS — J449 Chronic obstructive pulmonary disease, unspecified: Secondary | ICD-10-CM | POA: Insufficient documentation

## 2019-07-30 DIAGNOSIS — E039 Hypothyroidism, unspecified: Secondary | ICD-10-CM | POA: Insufficient documentation

## 2019-07-30 DIAGNOSIS — R1031 Right lower quadrant pain: Secondary | ICD-10-CM | POA: Diagnosis not present

## 2019-07-30 LAB — COMPREHENSIVE METABOLIC PANEL
ALT: 11 U/L (ref 0–44)
AST: 15 U/L (ref 15–41)
Albumin: 3.6 g/dL (ref 3.5–5.0)
Alkaline Phosphatase: 83 U/L (ref 38–126)
Anion gap: 12 (ref 5–15)
BUN: 10 mg/dL (ref 6–20)
CO2: 26 mmol/L (ref 22–32)
Calcium: 9.4 mg/dL (ref 8.9–10.3)
Chloride: 99 mmol/L (ref 98–111)
Creatinine, Ser: 0.62 mg/dL (ref 0.44–1.00)
GFR calc Af Amer: >60 mL/min (ref >60)
GFR calc non Af Amer: >60 mL/min (ref >60)
Glucose, Bld: 117 mg/dL — ABNORMAL HIGH (ref 70–99)
Potassium: 3.5 mmol/L (ref 3.5–5.1)
Sodium: 137 mmol/L (ref 135–145)
Total Bilirubin: 0.4 mg/dL (ref 0.3–1.2)
Total Protein: 7.7 g/dL (ref 6.5–8.1)

## 2019-07-30 LAB — CBC
HCT: 36.9 % (ref 36.0–46.0)
Hematocrit: 36.5 % (ref 34.0–46.6)
Hemoglobin: 11.8 g/dL (ref 11.1–15.9)
Hemoglobin: 11.8 g/dL — ABNORMAL LOW (ref 12.0–15.0)
MCH: 27.3 pg (ref 26.6–33.0)
MCH: 27.4 pg (ref 26.0–34.0)
MCHC: 32.3 g/dL (ref 31.5–35.7)
MCHC: 32 g/dL (ref 30.0–36.0)
MCV: 85 fL (ref 79–97)
MCV: 85.6 fL (ref 80.0–100.0)
Platelets: 299 10*3/uL (ref 150–450)
Platelets: 289 10*3/uL (ref 150–400)
RBC: 4.32 x10E6/uL (ref 3.77–5.28)
RBC: 4.31 MIL/uL (ref 3.87–5.11)
RDW: 16 % — ABNORMAL HIGH (ref 11.7–15.4)
RDW: 16.4 % — ABNORMAL HIGH (ref 11.5–15.5)
WBC: 13.6 10*3/uL — ABNORMAL HIGH (ref 3.4–10.8)
WBC: 16.7 10*3/uL — ABNORMAL HIGH (ref 4.0–10.5)
nRBC: 0 % (ref 0.0–0.2)

## 2019-07-30 LAB — URINALYSIS, ROUTINE W REFLEX MICROSCOPIC
Bilirubin Urine: NEGATIVE
Glucose, UA: NEGATIVE mg/dL
Ketones, ur: 5 mg/dL — AB
Leukocytes,Ua: NEGATIVE
Nitrite: NEGATIVE
Protein, ur: 100 mg/dL — AB
Specific Gravity, Urine: 1.016 (ref 1.005–1.030)
pH: 6 (ref 5.0–8.0)

## 2019-07-30 LAB — CMP14 + ANION GAP
ALT: 10 IU/L (ref 0–32)
AST: 12 IU/L (ref 0–40)
Albumin/Globulin Ratio: 1.3 (ref 1.2–2.2)
Albumin: 4.3 g/dL (ref 3.8–4.9)
Alkaline Phosphatase: 102 IU/L (ref 39–117)
Anion Gap: 14 mmol/L (ref 10.0–18.0)
BUN/Creatinine Ratio: 11 (ref 9–23)
BUN: 7 mg/dL (ref 6–24)
Bilirubin Total: 0.2 mg/dL (ref 0.0–1.2)
CO2: 25 mmol/L (ref 20–29)
Calcium: 9.5 mg/dL (ref 8.7–10.2)
Chloride: 102 mmol/L (ref 96–106)
Creatinine, Ser: 0.61 mg/dL (ref 0.57–1.00)
GFR calc Af Amer: 118 mL/min/{1.73_m2} (ref >59)
GFR calc non Af Amer: 102 mL/min/{1.73_m2} (ref >59)
Globulin, Total: 3.4 g/dL (ref 1.5–4.5)
Glucose: 91 mg/dL (ref 65–99)
Potassium: 4.2 mmol/L (ref 3.5–5.2)
Sodium: 141 mmol/L (ref 134–144)
Total Protein: 7.7 g/dL (ref 6.0–8.5)

## 2019-07-30 LAB — I-STAT BETA HCG BLOOD, ED (MC, WL, AP ONLY): I-stat hCG, quantitative: <5 m[IU]/mL (ref <5)

## 2019-07-30 LAB — LIPASE: Lipase: 92 U/L — ABNORMAL HIGH (ref 14–72)

## 2019-07-30 LAB — VITAMIN B12: Vitamin B-12: 545 pg/mL (ref 232–1245)

## 2019-07-30 LAB — LIPASE, BLOOD: Lipase: 47 U/L (ref 11–51)

## 2019-07-30 MED ORDER — PANTOPRAZOLE SODIUM 40 MG PO TBEC: 40.0000 mg | DELAYED_RELEASE_TABLET | Freq: Every day | ORAL | 0 refills | Status: DC

## 2019-07-30 MED ORDER — PANTOPRAZOLE SODIUM 40 MG IV SOLR
40.0000 mg | Freq: Once | INTRAVENOUS | Status: AC
  Administered 2019-07-30: 40 mg via INTRAVENOUS
  Filled 2019-07-30: qty 40

## 2019-07-30 MED ORDER — SODIUM CHLORIDE 0.9% FLUSH: 3.0000 mL | Freq: Once | INTRAVENOUS | Status: DC

## 2019-07-30 MED ORDER — MORPHINE SULFATE (PF) 4 MG/ML IV SOLN
4.0000 mg | Freq: Once | INTRAVENOUS | Status: AC
  Administered 2019-07-30: 4 mg via INTRAVENOUS
  Filled 2019-07-30: qty 1

## 2019-07-30 MED ORDER — ONDANSETRON HCL 4 MG/2ML IJ SOLN
4.0000 mg | Freq: Once | INTRAMUSCULAR | Status: AC
  Administered 2019-07-30: 4 mg via INTRAVENOUS
  Filled 2019-07-30: qty 2

## 2019-07-30 MED ORDER — SODIUM CHLORIDE 0.9 % IV BOLUS
1000.0000 mL | Freq: Once | INTRAVENOUS | Status: AC
  Administered 2019-07-30: 1000 mL via INTRAVENOUS

## 2019-07-30 NOTE — ED Notes (Signed)
Patient transported to CT 

## 2019-07-30 NOTE — ED Provider Notes (Signed)
[facility] Provider Note   CSN: [id number] Arrival date & time: 07/30/19  0534     History Chief Complaint  Patient presents with  . Abdominal Pain    Dana Martin is a 53 y.o. female.  53yo female presents with complaint of right side abdominal pain, aching in nature, intermittent, onset yesterday, worse today. Associated with nausea and vomiting, reports occasional loose stools. No changes in bladder habits. Patient reports history of pancreatitis, states feels different from previous episodes of pancreatis. Patient went to her PCP yesterday and had labs drawn with request to see GI for her history of pancreatitis. No other complaints or concerns.  Prior abdominal surgery includes cholecystectomy.        Past Medical History:  Diagnosis Date  . COPD (chronic obstructive pulmonary disease) (HCC)    denies SOB with daily activities  . Full dentures   . GERD (gastroesophageal reflux disease)   . History of pancreatitis   . Hypothyroidism   . Nasal sinus congestion 05/08/2014   with sinus drainage  . PVC (premature ventricular contraction) 07/07/2017  . Stress incontinence   . Symptomatic cholelithiasis 05/2014  . Varicose veins of bilateral lower extremities with pain     Patient Active Problem List   Diagnosis Date Noted  . Vitamin B12 deficiency 06/26/2019  . Hyperlipidemia 05/07/2018  . PVC (premature ventricular contraction) 07/07/2017  . Leukocytosis 03/16/2015  . Environmental allergies 05/01/2014  . Rhinitis, allergic 05/01/2014  . Acute pancreatitis 12/06/2013  . Hypothyroidism 12/06/2013  . Allergic rhinitis 12/06/2013  . Health maintenance examination 05/29/2013  . Hyponatremia 05/09/2013  . Pancreatitis 05/08/2013    Past Surgical History:  Procedure Laterality Date  . BREAST BIOPSY Right pt unsure   benign  . BREAST EXCISIONAL BIOPSY Left pt unsure   benign  . BREAST LUMPECTOMY    . CHOLECYSTECTOMY N/A  05/13/2014   Procedure: LAPAROSCOPIC CHOLECYSTECTOMY WITH INTRAOPERATIVE CHOLANGIOGRAM;  Surgeon: Abigail Miyamotoouglas Blackman, MD;  Location: [facility];  Service: General;  Laterality: N/A;  . EUS N/A 02/19/2014   Procedure: ESOPHAGEAL ENDOSCOPIC ULTRASOUND (EUS) RADIAL;  Surgeon: Willis ModenaWilliam Outlaw, MD;  Location: [facility];  Service: Endoscopy;  Laterality: N/A;  . TUBAL LIGATION    . VAGINAL HYSTERECTOMY  11/13/2001   transvaginal      OB History   No obstetric history on file.     Family History  Problem Relation Age of Onset  . Hypertension Mother   . Atrial fibrillation Mother   . Heart attack Mother   . Hypertension Sister   . Hypertension Brother   . Hypertension Daughter   . Lung cancer Brother   . Diabetes Sister     Social History   Tobacco Use  . Smoking status: Former Smoker    Quit date: 05/08/2013    Years since quitting: 6.2  . Smokeless tobacco: Never Used  Substance Use Topics  . Alcohol use: Not Currently  . Drug use: No    Home Medications Prior to Admission medications   Medication Sig Start Date End Date Taking? Authorizing Provider  albuterol (PROVENTIL HFA;VENTOLIN HFA) 108 (90 Base) MCG/ACT inhaler Inhale 2 puffs into the lungs every 6 (six) hours as needed for wheezing or shortness of breath.    [provider]  diphenhydrAMINE (BENADRYL) 25 MG tablet Take 12.5 mg by mouth daily as needed for itching (allergic reaction).    [provider]  famotidine (PEPCID) 20 MG tablet Take 20 mg by  mouth 2 (two) times daily.    [provider]  levothyroxine (SYNTHROID) 175 MCG tablet Take 1 tablet (175 mcg total) by mouth daily. 06/05/19   Arnette Felts, FNP  Multiple Vitamin (MULTIVITAMIN) tablet Take 1 tablet by mouth daily.    [provider]  pantoprazole (PROTONIX) 40 MG tablet Take 1 tablet (40 mg total) by mouth daily for 10 days. 07/30/19 08/09/19  Jeannie Fend, PA-C  Polyethyl Glycol-Propyl Glycol (SYSTANE  ULTRA) 0.4-0.3 % SOLN Place 1 drop into both eyes 5 (five) times daily as needed (dry eyes/irriation).    [provider]  traMADol (ULTRAM) 50 MG tablet Take 1 tablet (50 mg total) by mouth every 6 (six) hours as needed. 06/13/19   Arnette Felts, FNP  Vitamin D, Ergocalciferol, (DRISDOL) 1.25 MG (50000 UT) CAPS capsule Take 1 capsule (50,000 Units total) by mouth every 7 (seven) days. 06/10/19   Arnette Felts, FNP    Allergies    Fish allergy, Gadolinium derivatives, Multihance [gadobenate], Omnipaque [iohexol], Other, Peanut-containing drug products, Shellfish allergy, Codeine, and Oxycodone  Review of Systems   Review of Systems  Constitutional: Negative for chills, diaphoresis and fever.  Respiratory: Negative for shortness of breath.   Cardiovascular: Negative for chest pain.  Gastrointestinal: Positive for abdominal pain, diarrhea, nausea and vomiting. Negative for constipation.  Genitourinary: Negative for dysuria, frequency, hematuria and urgency.  Musculoskeletal: Negative for arthralgias and myalgias.  Skin: Negative for rash and wound.  Allergic/Immunologic: Negative for immunocompromised state.  Neurological: Negative for weakness.  Psychiatric/Behavioral: Negative for confusion.  All other systems reviewed and are negative.   Physical Exam Updated Vital Signs BP (!) 158/94 (BP Location: Right Arm)   Pulse 84   Temp 98.2 F (36.8 C) (Oral)   Resp 18   SpO2 99%   Physical Exam Vitals and nursing note reviewed.  Constitutional:      General: She is not in acute distress.    Appearance: She is well-developed. She is not diaphoretic.  HENT:     Head: Normocephalic and atraumatic.  Cardiovascular:     Rate and Rhythm: Normal rate and regular rhythm.     Heart sounds: Normal heart sounds.  Pulmonary:     Effort: Pulmonary effort is normal.     Breath sounds: Normal breath sounds.  Abdominal:     Palpations: Abdomen is soft.     Tenderness: There is no  abdominal tenderness. There is no right CVA tenderness or left CVA tenderness.  Skin:    General: Skin is warm and dry.     Findings: No rash.  Neurological:     Mental Status: She is alert and oriented to person, place, and time.  Psychiatric:        Behavior: Behavior normal.     ED Results / Procedures / Treatments   Labs (all labs ordered are listed, but only abnormal results are displayed) Labs Reviewed  COMPREHENSIVE METABOLIC PANEL - Abnormal; Notable for the following components:      Result Value   Glucose, Bld 117 (*)    All other components within normal limits  CBC - Abnormal; Notable for the following components:   WBC 16.7 (*)    Hemoglobin 11.8 (*)    RDW 16.4 (*)    All other components within normal limits  URINALYSIS, ROUTINE W REFLEX MICROSCOPIC - Abnormal; Notable for the following components:   Hgb urine dipstick LARGE (*)    Ketones, ur 5 (*)    Protein, ur  100 (*)    Bacteria, UA RARE (*)    All other components within normal limits  LIPASE, BLOOD  I-STAT BETA HCG BLOOD, ED ([facility], [facility], [facility] ONLY)    EKG None  Radiology CT Renal Stone Study  Result Date: 07/30/2019 CLINICAL DATA:  Right flank pain EXAM: CT ABDOMEN AND PELVIS WITHOUT CONTRAST TECHNIQUE: Multidetector CT imaging of the abdomen and pelvis was performed following the standard protocol without IV contrast. COMPARISON:  03/14/2015 FINDINGS: Lower chest: Lung bases are clear. No effusions. Heart is normal size. Hepatobiliary: No suspicious focal liver abnormality is seen. Stable 4 cm right hepatic cyst. Status post cholecystectomy. No biliary dilatation. Pancreas: There is stranding noted around the pancreatic head and adjacent proximal duodenum. It is unclear source of this inflammation. Cannot exclude focal pancreatitis. No ductal dilatation. Pancreatic body and tail grossly unremarkable. Spleen: No focal abnormality.  Normal size. Adrenals/Urinary Tract: Punctate nonobstructing stones in the  upper pole of the right kidney. No ureteral stones or hydronephrosis. Adrenal glands and urinary bladder unremarkable. Small low-density area within the midpole of the right kidney measures 10 mm compared to 10 mm previously, likely small cyst. Stomach/Bowel: Left colonic diverticulosis. No active diverticulitis. Stomach and small bowel decompressed, unremarkable. Vascular/Lymphatic: Aortic atherosclerosis. No enlarged abdominal or pelvic lymph nodes. Reproductive: Prior hysterectomy.  No adnexal masses. Other: No free fluid or free air. Musculoskeletal: No acute bony abnormality. IMPRESSION: Stranding/inflammation in the right upper abdomen around the pancreatic head and proximal duodenum. It is difficult to determine etiology but could reflect focal acute pancreatitis changes or duodenitis. Recommend clinical correlation and correlation with laboratory values. Punctate right upper pole nephrolithiasis. No ureteral stones or hydronephrosis. Aortic atherosclerosis. Left colonic diverticulosis. Electronically Signed   By: Rolm Baptise M.D.   On: 07/30/2019 08:43    Procedures Procedures (including critical care time)  Medications Ordered in ED Medications  sodium chloride flush (NS) 0.9 % injection 3 mL (3 mLs Intravenous Not Given 07/30/19 0839)  sodium chloride 0.9 % bolus 1,000 mL (0 mLs Intravenous Stopped 07/30/19 1022)  morphine 4 MG/ML injection 4 mg (4 mg Intravenous Given 07/30/19 0823)  ondansetron (ZOFRAN) injection 4 mg (4 mg Intravenous Given 07/30/19 0822)  pantoprazole (PROTONIX) injection 40 mg (40 mg Intravenous Given 07/30/19 1019)    ED Course  I have reviewed the triage vital signs and the nursing notes.  Pertinent labs & imaging results that were available during my care of the patient were reviewed by me and considered in my medical decision making (see chart for details).  Clinical Course as of Jul 29 1206  Tue Jul 29, 5465  4224 53 year old female presents with complaint  of right abdominal pain with nausea and vomiting.  Patient states pain feels different than her typical pancreatitis.  Patient saw PCP yesterday, had labs drawn and plan was to refer to GI for second opinion on her pancreatitis.  Patient denies blood in her emesis or stools, states that she does take NSAIDs regularly.  No other complaints or concerns. Labs were completed prior to patient being seen in the emergency room, compared to her previous labs, slight increase in her white blood cell count from 13,000-16,000, urinalysis with large amount of hemoglobin, protein, no evidence of UTI, no history of kidney stones.  CMP within normal limits, hCG negative.  Patient's lipase is 47, do not suspect acute pancreatitis, CT without contrast was ordered to evaluate for possible stone cause for her right side and flank pain.  Abdomen is  soft and nontender, no CVA tenderness.  CT urogram returns with right-sided kidney stones, no ureterolithiasis.  Question duodenitis versus pancreatitis.  As lipase has improved compared to previous labs and is not elevated today, doubt pancreatitis.  Plan is to treat patient for duodenitis, given Protonix in the ER with prescription for same, advised to discontinue her daily NSAIDs which may be causing an ulcer versus worsening pancreatitis.  Patient to follow-up with GI as scheduled, return to ER for new or worsening symptoms.  She was discharged able to tolerate p.o. fluids with pain better controlled.   [LM]    Clinical Course User Index [LM] Alden Hipp   MDM Rules/Calculators/A&P                      Final Clinical Impression(s) / ED Diagnoses Final diagnoses:  Duodenitis    Rx / DC Orders ED Discharge Orders         Ordered    pantoprazole (PROTONIX) 40 MG tablet  Daily     07/30/19 1119           Alden Hipp 07/30/19 1208    Tilden Fossa, MD 07/31/19 0930

## 2019-07-30 NOTE — Discharge Instructions (Addendum)
Take Protonix as prescribed.  Follow-up with your primary care provider.  Follow-up with GI as scheduled. You should stop taking Motrin and all other NSAIDs.  This could be irritating the lining of your stomach resulting in a ulcer.  NSAIDs may also cause pancreatitis.

## 2019-07-30 NOTE — ED Notes (Signed)
Pt given some water and crackers for po challenge. Pt stated she didn't really want to eat anything but would try.

## 2019-07-30 NOTE — ED Triage Notes (Signed)
Patient reports RLQ pain radiating to right lower back with emesis onset yesterday morning , denies diarrhea , no fever or chills .

## 2019-08-07 ENCOUNTER — Telehealth: Payer: Self-pay

## 2019-08-07 NOTE — Telephone Encounter (Signed)
Per YR call Ezequiel Essex just wanted to check on her and she how she is doing we got notes sating she went to the ER  Spoke w/pt she stated she is doing better

## 2019-08-08 ENCOUNTER — Encounter (HOSPITAL_COMMUNITY): Payer: Self-pay

## 2019-08-08 ENCOUNTER — Ambulatory Visit (HOSPITAL_COMMUNITY): Payer: BC Managed Care – PPO

## 2019-08-14 ENCOUNTER — Encounter: Payer: Self-pay | Admitting: Nurse Practitioner

## 2019-08-14 ENCOUNTER — Ambulatory Visit (INDEPENDENT_AMBULATORY_CARE_PROVIDER_SITE_OTHER): Payer: BC Managed Care – PPO | Admitting: Nurse Practitioner

## 2019-08-14 ENCOUNTER — Other Ambulatory Visit: Payer: Self-pay

## 2019-08-14 VITALS — BP 122/84 | HR 92 | Temp 98.0°F | Ht 66.2 in | Wt 180.6 lb

## 2019-08-14 DIAGNOSIS — E538 Deficiency of other specified B group vitamins: Secondary | ICD-10-CM | POA: Diagnosis not present

## 2019-08-14 DIAGNOSIS — Z8719 Personal history of other diseases of the digestive system: Secondary | ICD-10-CM | POA: Diagnosis not present

## 2019-08-14 DIAGNOSIS — Z91018 Allergy to other foods: Secondary | ICD-10-CM | POA: Diagnosis not present

## 2019-08-14 MED ORDER — CYANOCOBALAMIN 1000 MCG/ML IJ SOLN
1000.0000 ug | Freq: Once | INTRAMUSCULAR | Status: AC
  Administered 2019-08-14: 1000 ug via INTRAMUSCULAR

## 2019-08-14 NOTE — Progress Notes (Signed)
This visit occurred during the SARS-CoV-2 public health emergency.  Safety protocols were in place, including screening questions prior to the visit, additional usage of staff PPE, and extensive cleaning of exam room while observing appropriate contact time as indicated for disinfecting solutions.  Subjective:     Patient ID: Dana Martin , female    DOB: 1965/08/31 , 54 y.o.   MRN: [id number]   Chief Complaint  Patient presents with  . B12 Injection    HPI  She is here for Vitamin B12 injection due to her levels were still a little low and she has good benefit. She has improved fatigue and is able to go to sleep without taking any sleep aid (Benadryl).    Seen in ER 07/30/2019 for vomiting and abdominal pain, she had seen Dr. Elnoria Howard earlier that day.  He had recommended she admit herself but she did not want to due to her special needs daughter.  She is scheduled to have an Endoscopy on 08/23/2019    Past Medical History:  Diagnosis Date  . COPD (chronic obstructive pulmonary disease) (HCC)    denies SOB with daily activities  . Full dentures   . GERD (gastroesophageal reflux disease)   . History of pancreatitis   . Hypothyroidism   . Nasal sinus congestion 05/08/2014   with sinus drainage  . PVC (premature ventricular contraction) 07/07/2017  . Stress incontinence   . Symptomatic cholelithiasis 05/2014  . Varicose veins of bilateral lower extremities with pain      Family History  Problem Relation Age of Onset  . Hypertension Mother   . Atrial fibrillation Mother   . Heart attack Mother   . Hypertension Sister   . Hypertension Brother   . Hypertension Daughter   . Lung cancer Brother   . Diabetes Sister      Current Outpatient Medications:  .  albuterol (PROVENTIL HFA;VENTOLIN HFA) 108 (90 Base) MCG/ACT inhaler, Inhale 2 puffs into the lungs every 6 (six) hours as needed for wheezing or shortness of breath., Disp: , Rfl:  .  diphenhydrAMINE (BENADRYL) 25 MG tablet,  Take 12.5 mg by mouth daily as needed for itching (allergic reaction)., Disp: , Rfl:  .  famotidine (PEPCID) 20 MG tablet, Take 20 mg by mouth 2 (two) times daily., Disp: , Rfl:  .  levothyroxine (SYNTHROID) 175 MCG tablet, Take 1 tablet (175 mcg total) by mouth daily., Disp: 90 tablet, Rfl: 1 .  Multiple Vitamin (MULTIVITAMIN) tablet, Take 1 tablet by mouth daily., Disp: , Rfl:  .  Polyethyl Glycol-Propyl Glycol (SYSTANE ULTRA) 0.4-0.3 % SOLN, Place 1 drop into both eyes 5 (five) times daily as needed (dry eyes/irriation)., Disp: , Rfl:  .  traMADol (ULTRAM) 50 MG tablet, Take 1 tablet (50 mg total) by mouth every 6 (six) hours as needed., Disp: 20 tablet, Rfl: 0 .  Vitamin D, Ergocalciferol, (DRISDOL) 1.25 MG (50000 UT) CAPS capsule, Take 1 capsule (50,000 Units total) by mouth every 7 (seven) days., Disp: 24 capsule, Rfl: 0  Current Facility-Administered Medications:  .  ipratropium-albuterol (DUONEB) 0.5-2.5 (3) MG/3ML nebulizer solution 3 mL, 3 mL, Nebulization, Once, Rodriguez-Southworth, Nettie Elm, PA-C   Allergies  Allergen Reactions  . Fish Allergy Hives  . Gadolinium Derivatives Hives, Itching and Rash    Pt stated she felt itching and burning in her throat immediately following the injection.  . Multihance [Gadobenate] Hives, Itching and Rash    Itching and burning in her throat. Hives and itching of her skin.  Marland Kitchen  Omnipaque [Iohexol] Hives  . Other Nausea And Vomiting    ALL NARCOTICS  . Peanut-Containing Drug Products Hives  . Shellfish Allergy Hives  . Codeine Nausea And Vomiting  . Oxycodone Nausea And Vomiting     Review of Systems  Constitutional: Negative.   Respiratory: Negative.  Negative for cough.   Cardiovascular: Negative.  Negative for chest pain, palpitations and leg swelling.  Allergic/Immunologic:       Reports she has seen an allergist in the past and was advised she is allergic to multiple foods.  Has seen Dr. Ishmael Holter in the past.   Neurological: Negative for  dizziness and headaches.  Psychiatric/Behavioral: Negative.      Today's Vitals   08/14/19 0902  BP: 122/84  Pulse: 92  Temp: 98 F (36.7 C)  TempSrc: Oral  Weight: 180 lb 9.6 oz (81.9 kg)  Height: 5' 6.2" (1.681 m)  PainSc: 0-No pain   Body mass index is 28.97 kg/m.   Objective:  Physical Exam Vitals reviewed.  Constitutional:      Appearance: Normal appearance.  Cardiovascular:     Rate and Rhythm: Normal rate and regular rhythm.     Pulses: Normal pulses.     Heart sounds: Normal heart sounds. No murmur.  Pulmonary:     Effort: Pulmonary effort is normal. No respiratory distress.     Breath sounds: Normal breath sounds.  Skin:    Capillary Refill: Capillary refill takes less than 2 seconds.  Neurological:     General: No focal deficit present.     Mental Status: She is alert and oriented to person, place, and time.     Cranial Nerves: No cranial nerve deficit.  Psychiatric:        Mood and Affect: Mood normal.        Behavior: Behavior normal.        Thought Content: Thought content normal.        Judgment: Judgment normal.         Assessment And Plan:     1. Vitamin B12 deficiency  Vitamin B12 level remains slightly low and she has good benefit from the vitamin B12 injection  Will have her to return in 2 weeks for another injection - cyanocobalamin ((VITAMIN B-12)) injection 1,000 mcg  2. Multiple food allergies  Requesting a referral to an allergist to re-evaluate her multiple food allergies - Ambulatory referral to Allergy  3. History of pancreatitis  She had an ER visit in December treated with antibiotics and Dr. Benson Norway wanted her to be admitted however she declined  She states she is doing better today  She is scheduled for an Endoscopy next week.   [name], FNP    THE PATIENT IS ENCOURAGED TO PRACTICE SOCIAL DISTANCING DUE TO THE COVID-19 PANDEMIC.

## 2019-08-15 ENCOUNTER — Encounter (HOSPITAL_COMMUNITY): Payer: Self-pay | Admitting: Gastroenterology

## 2019-08-15 ENCOUNTER — Other Ambulatory Visit: Payer: Self-pay

## 2019-08-20 ENCOUNTER — Other Ambulatory Visit (HOSPITAL_COMMUNITY)
Admission: RE | Admit: 2019-08-20 | Discharge: 2019-08-20 | Disposition: A | Discharge status: Home / Self Care | Payer: BC Managed Care – PPO | Source: Ambulatory Visit | Attending: Gastroenterology | Admitting: Gastroenterology

## 2019-08-20 DIAGNOSIS — Z20822 Contact with and (suspected) exposure to covid-19: Secondary | ICD-10-CM | POA: Diagnosis not present

## 2019-08-20 DIAGNOSIS — Z01812 Encounter for preprocedural laboratory examination: Secondary | ICD-10-CM | POA: Diagnosis not present

## 2019-08-22 LAB — NOVEL CORONAVIRUS, NAA (HOSP ORDER, SEND-OUT TO REF LAB; TAT 18-24 HRS): SARS-CoV-2, NAA: NOT DETECTED

## 2019-08-23 ENCOUNTER — Encounter (HOSPITAL_COMMUNITY)
Admission: RE | Disposition: A | Discharge status: Home / Self Care | Payer: Self-pay | Source: Home / Self Care | Attending: Gastroenterology

## 2019-08-23 ENCOUNTER — Ambulatory Visit (HOSPITAL_COMMUNITY)
Admission: RE | Admit: 2019-08-23 | Discharge: 2019-08-23 | Disposition: A | Discharge status: Home / Self Care | Payer: BC Managed Care – PPO | Attending: Gastroenterology | Admitting: Gastroenterology

## 2019-08-23 ENCOUNTER — Ambulatory Visit (HOSPITAL_COMMUNITY): Payer: BC Managed Care – PPO | Admitting: Anesthesiology

## 2019-08-23 ENCOUNTER — Encounter (HOSPITAL_COMMUNITY): Payer: Self-pay | Admitting: Gastroenterology

## 2019-08-23 ENCOUNTER — Other Ambulatory Visit: Payer: Self-pay

## 2019-08-23 DIAGNOSIS — K219 Gastro-esophageal reflux disease without esophagitis: Secondary | ICD-10-CM | POA: Insufficient documentation

## 2019-08-23 DIAGNOSIS — K7689 Other specified diseases of liver: Secondary | ICD-10-CM | POA: Insufficient documentation

## 2019-08-23 DIAGNOSIS — J449 Chronic obstructive pulmonary disease, unspecified: Secondary | ICD-10-CM | POA: Diagnosis not present

## 2019-08-23 DIAGNOSIS — K8500 Idiopathic acute pancreatitis without necrosis or infection: Secondary | ICD-10-CM | POA: Diagnosis not present

## 2019-08-23 DIAGNOSIS — Z79899 Other long term (current) drug therapy: Secondary | ICD-10-CM | POA: Diagnosis not present

## 2019-08-23 DIAGNOSIS — Z79890 Hormone replacement therapy (postmenopausal): Secondary | ICD-10-CM | POA: Diagnosis not present

## 2019-08-23 DIAGNOSIS — R1013 Epigastric pain: Secondary | ICD-10-CM | POA: Insufficient documentation

## 2019-08-23 DIAGNOSIS — E785 Hyperlipidemia, unspecified: Secondary | ICD-10-CM | POA: Diagnosis not present

## 2019-08-23 DIAGNOSIS — K861 Other chronic pancreatitis: Secondary | ICD-10-CM | POA: Insufficient documentation

## 2019-08-23 DIAGNOSIS — Z9049 Acquired absence of other specified parts of digestive tract: Secondary | ICD-10-CM | POA: Diagnosis not present

## 2019-08-23 DIAGNOSIS — Z87891 Personal history of nicotine dependence: Secondary | ICD-10-CM | POA: Diagnosis not present

## 2019-08-23 DIAGNOSIS — E039 Hypothyroidism, unspecified: Secondary | ICD-10-CM | POA: Diagnosis not present

## 2019-08-23 DIAGNOSIS — D72829 Elevated white blood cell count, unspecified: Secondary | ICD-10-CM | POA: Diagnosis not present

## 2019-08-23 DIAGNOSIS — R1084 Generalized abdominal pain: Secondary | ICD-10-CM | POA: Diagnosis not present

## 2019-08-23 HISTORY — PX: ESOPHAGOGASTRODUODENOSCOPY (EGD) WITH PROPOFOL: SHX5813

## 2019-08-23 HISTORY — DX: Presence of dental prosthetic device (complete) (partial): Z97.2

## 2019-08-23 HISTORY — DX: Presence of spectacles and contact lenses: Z97.3

## 2019-08-23 HISTORY — PX: UPPER ESOPHAGEAL ENDOSCOPIC ULTRASOUND (EUS): SHX6562

## 2019-08-23 SURGERY — UPPER ESOPHAGEAL ENDOSCOPIC ULTRASOUND (EUS)
Anesthesia: Monitor Anesthesia Care

## 2019-08-23 MED ORDER — PROPOFOL 1000 MG/100ML IV EMUL
INTRAVENOUS | Status: AC
  Filled 2019-08-23: qty 100

## 2019-08-23 MED ORDER — PROPOFOL 500 MG/50ML IV EMUL
INTRAVENOUS | Status: DC | PRN
  Administered 2019-08-23 (×2): 20 mg via INTRAVENOUS
  Administered 2019-08-23: 10 mg via INTRAVENOUS
  Administered 2019-08-23: 50 mg via INTRAVENOUS

## 2019-08-23 MED ORDER — LACTATED RINGERS IV SOLN: INTRAVENOUS | Status: DC

## 2019-08-23 MED ORDER — PROPOFOL 500 MG/50ML IV EMUL
INTRAVENOUS | Status: DC | PRN
  Administered 2019-08-23: 150 ug/kg/min via INTRAVENOUS

## 2019-08-23 NOTE — Op Note (Signed)
[facility] Patient Name: Dana Martin Procedure Date: 08/23/2019 MRN: [id number] Attending MD: Jeani Hawking , MD Date of Birth: [birth year] CSN: [id number] Age: 54 Admit Type: Outpatient Procedure:                Upper EUS Indications:              Suspected chronic pancreatitis, Epigastric                            abdominal pain Providers:                Jeani Hawking, MD, Tillie Fantasia, RN, Lanna Poche, Technician, Everardo Pacific, Technician, Roni Bread, CRNA Referring MD:              Medicines:                Propofol per Anesthesia Complications:            No immediate complications. Estimated Blood Loss:     Estimated blood loss: none. Procedure:                Pre-Anesthesia Assessment:                           - Prior to the procedure, a History and Physical                            was performed, and patient medications and                            allergies were reviewed. The patient's tolerance of                            previous anesthesia was also reviewed. The risks                            and benefits of the procedure and the sedation                            options and risks were discussed with the patient.                            All questions were answered, and informed consent                            was obtained. Prior Anticoagulants: The patient has                            taken no previous anticoagulant or antiplatelet                            agents. ASA Grade Assessment: II - A patient  with                            mild systemic disease. After reviewing the risks                            and benefits, the patient was deemed in                            satisfactory condition to undergo the procedure.                           - Sedation was administered by an anesthesia                            professional. Deep sedation was attained.                            After obtaining informed consent, the endoscope was                            passed under direct vision. Throughout the                            procedure, the patient's blood pressure, pulse, and                            oxygen saturations were monitored continuously. The                            [id number] ([id number] Olympus Linear EUS was                            introduced through the mouth, and advanced to the                            second part of duodenum. The upper EUS was                            accomplished without difficulty. The patient                            tolerated the procedure well. Scope In: Scope Out: Findings:      ENDOSONOGRAPHIC FINDING: :      Evidence of a previous cholecystectomy was identified       endosonographically.      A cyst was found in the liver and measured 40 mm by 40 mm in maximal       cross-sectional diameter. The cyst was anechoic. It was without septae.       The outer wall of the lesion was not seen.      There was no evidence of any choledocholithiasis. the CBD measaured 1-3       mm in diameter. The pancreas appeared "hazy", but there was no evidence       of stranding or any fluid collections. The PD was  normal in caliber. Impression:               - Evidence of a cholecystectomy.                           - A cyst was found in the liver and measured 40 mm                            by 40 mm.                           - No specimens collected. Moderate Sedation:      Not Applicable - Patient had care per Anesthesia. Recommendation:           - Patient has a contact number available for                            emergencies. The signs and symptoms of potential                            delayed complications were discussed with the                            patient. Return to normal activities tomorrow.                            Written discharge instructions were provided to the                             patient.                           - Resume regular diet. Procedure Code(s):        --- Professional ---                           [phone], Esophagogastroduodenoscopy, flexible,                            transoral; with endoscopic ultrasound examination                            limited to the esophagus, stomach or duodenum, and                            adjacent structures Diagnosis Code(s):        --- Professional ---                           R10.13, Epigastric pain                           Z90.49, Acquired absence of other specified parts                            of digestive tract  K76.89, Other specified diseases of liver CPT copyright 2019 [employer]. All rights reserved. The codes documented in this report are preliminary and upon coder review may  be revised to meet current compliance requirements. Jeani Hawking, MD Jeani Hawking, MD 08/23/2019 10:30:33 AM This report has been signed electronically. Number of Addenda: 0

## 2019-08-23 NOTE — Anesthesia Preprocedure Evaluation (Signed)
Anesthesia Evaluation  Patient identified by MRN, date of birth, ID band Patient awake    Reviewed: Allergy & Precautions, NPO status , Patient's Chart, lab work & pertinent test results  Airway Mallampati: II  TM Distance: >3 FB Neck ROM: Full    Dental no notable dental hx.    Pulmonary neg pulmonary ROS, former smoker,    Pulmonary exam normal breath sounds clear to auscultation       Cardiovascular negative cardio ROS Normal cardiovascular exam Rhythm:Regular Rate:Normal     Neuro/Psych negative neurological ROS  negative psych ROS   GI/Hepatic Neg liver ROS, GERD  ,  Endo/Other  Hypothyroidism   Renal/GU negative Renal ROS  negative genitourinary   Musculoskeletal negative musculoskeletal ROS (+)   Abdominal   Peds negative pediatric ROS (+)  Hematology negative hematology ROS (+)   Anesthesia Other Findings   Reproductive/Obstetrics negative OB ROS                             Anesthesia Physical Anesthesia Plan  ASA: II  Anesthesia Plan: MAC   Post-op Pain Management:    Induction: Intravenous  PONV Risk Score and Plan: 0  Airway Management Planned: Simple Face Mask  Additional Equipment:   Intra-op Plan:   Post-operative Plan:   Informed Consent: I have reviewed the patients History and Physical, chart, labs and discussed the procedure including the risks, benefits and alternatives for the proposed anesthesia with the patient or authorized representative who has indicated his/her understanding and acceptance.     Dental advisory given  Plan Discussed with: CRNA and Surgeon  Anesthesia Plan Comments:         Anesthesia Quick Evaluation

## 2019-08-23 NOTE — H&P (Signed)
Dana Martin HPI: The patient was recently experienced pain consistent with her prior gallstone pancreatitis. She was originally hospitalized 12/2013 for 4 days and treated conservatively. The CT scan was positive for an acute pancreatitis and there was sludge/punctate stones in the gallbladder during that hospitalization. An outpatient EUS was performed by Dr. Paulita Fujita and the EUS was consistent with the prior imaging. The EUS was performed on 02/2014. The pancreatic parenchyma was normal. There was a known cyst in the gallbladder fossa. A lap chole was performed by Dr. Ninfa Linden on 05/2014 without any complications. Her admission lipase in 2015 was at 196 and most recently on 06/13/2019 it was at 106. Over these past 5 years she reports two additional bouts of pancreatitis and she did not present to the hospital for one of the bouts, although in retrospect she should have presented. Her abdomen continues to feel sore and there is some radiation of her pain to her back.     Past Medical History:  Diagnosis Date  . COPD (chronic obstructive pulmonary disease) ([place])    denies SOB with daily activities  . Full dentures   . GERD (gastroesophageal reflux disease)   . History of pancreatitis   . Hypothyroidism   . Nasal sinus congestion 05/08/2014   with sinus drainage  . PVC (premature ventricular contraction) 07/07/2017  . Stress incontinence   . Symptomatic cholelithiasis 05/2014  . Varicose veins of bilateral lower extremities with pain   . Wears dentures    upper and lower  . Wears glasses     Past Surgical History:  Procedure Laterality Date  . BREAST BIOPSY Right pt unsure   benign  . BREAST EXCISIONAL BIOPSY Left pt unsure   benign  . BREAST LUMPECTOMY    . CHOLECYSTECTOMY N/A 05/13/2014   Procedure: LAPAROSCOPIC CHOLECYSTECTOMY WITH INTRAOPERATIVE CHOLANGIOGRAM;  Surgeon: Coralie Keens, MD;  Location: [facility];  Service: General;  Laterality: N/A;  . EUS  N/A 02/19/2014   Procedure: ESOPHAGEAL ENDOSCOPIC ULTRASOUND (EUS) RADIAL;  Surgeon: Arta Silence, MD;  Location: [facility];  Service: Endoscopy;  Laterality: N/A;  . TUBAL LIGATION    . VAGINAL HYSTERECTOMY  11/13/2001   transvaginal     Family History  Problem Relation Age of Onset  . Hypertension Mother   . Atrial fibrillation Mother   . Heart attack Mother   . Hypertension Sister   . Hypertension Brother   . Hypertension Daughter   . Lung cancer Brother   . Diabetes Sister     Social History:  reports that she quit smoking about 6 years ago. She has never used smokeless tobacco. She reports previous alcohol use. She reports that she does not use drugs.  Allergies:  Allergies  Allergen Reactions  . Fish Allergy Hives  . Gadolinium Derivatives Hives, Itching and Rash    Pt stated she felt itching and burning in her throat immediately following the injection.  . Multihance [Gadobenate] Hives, Itching and Rash    Itching and burning in her throat. Hives and itching of her skin.  . Omnipaque [Iohexol] Hives  . Other Nausea And Vomiting    ALL NARCOTICS  . Peanut-Containing Drug Products Hives  . Shellfish Allergy Hives  . Codeine Nausea And Vomiting  . Oxycodone Nausea And Vomiting  . Tramadol     headaches    Medications:  Scheduled:  Continuous: . lactated ringers 20 mL/hr at 08/23/19 0825    No results found for this or any previous visit (  from the past 24 hour(s)).   No results found.  ROS:  As stated above in the HPI otherwise negative.  Blood pressure 132/89, pulse 89, temperature 97.9 F (36.6 C), temperature source Oral, resp. rate (!) 25, height 5\' 5"  (1.651 m), weight 81.6 kg, SpO2 96 %.    PE: Gen: NAD, Alert and Oriented HEENT:  [place]/AT, EOMI Neck: Supple, no LAD Lungs: CTA Bilaterally CV: RRR without M/G/R ABM: Soft, NTND, +BS Ext: No C/C/E  Assessment/Plan: 1) Recurrent pancreatitis - EUS.  Minka Knight D 08/23/2019, 9:01 AM

## 2019-08-23 NOTE — Anesthesia Procedure Notes (Signed)
Date/Time: 08/23/2019 9:57 AM Performed by: Thornell Mule, CRNA Oxygen Delivery Method: Simple face mask

## 2019-08-23 NOTE — Anesthesia Postprocedure Evaluation (Signed)
Anesthesia Post Note  Patient: Dana Martin  Procedure(s) Performed: UPPER ESOPHAGEAL ENDOSCOPIC ULTRASOUND (EUS) (N/A )     Patient location during evaluation: PACU Anesthesia Type: MAC Level of consciousness: awake and alert Pain management: pain level controlled Vital Signs Assessment: post-procedure vital signs reviewed and stable Respiratory status: spontaneous breathing, nonlabored ventilation, respiratory function stable and patient connected to nasal cannula oxygen Cardiovascular status: stable and blood pressure returned to baseline Postop Assessment: no apparent nausea or vomiting Anesthetic complications: no    Last Vitals:  Vitals:   08/23/19 1040 08/23/19 1050  BP: 134/88 133/90  Pulse: 84 83  Resp: (!) 24 14  Temp:    SpO2: 100% 99%    Last Pain:  Vitals:   08/23/19 1050  TempSrc:   PainSc: 0-No pain                 Rishard Delange S

## 2019-08-23 NOTE — Transfer of Care (Signed)
Immediate Anesthesia Transfer of Care Note  Patient: Dana Martin  Procedure(s) Performed: UPPER ESOPHAGEAL ENDOSCOPIC ULTRASOUND (EUS) (N/A )  Patient Location: PACU  Anesthesia Type:MAC  Level of Consciousness: drowsy, patient cooperative and responds to stimulation  Airway & Oxygen Therapy: Patient Spontanous Breathing and Patient connected to face mask oxygen  Post-op Assessment: Report given to RN and Post -op Vital signs reviewed and stable  Post vital signs: Reviewed and stable  Last Vitals:  Vitals Value Taken Time  BP 99/77 08/23/19 1030  Temp    Pulse 83 08/23/19 1031  Resp 21 08/23/19 1031  SpO2 100 % 08/23/19 1031  Vitals shown include unvalidated device data.  Last Pain:  Vitals:   08/23/19 0819  TempSrc: Oral  PainSc: 0-No pain         Complications: No apparent anesthesia complications

## 2019-08-23 NOTE — Discharge Instructions (Signed)

## 2019-08-26 ENCOUNTER — Encounter: Payer: Self-pay | Admitting: *Deleted

## 2019-08-28 ENCOUNTER — Other Ambulatory Visit: Payer: Self-pay

## 2019-08-28 ENCOUNTER — Encounter: Payer: Self-pay | Admitting: Nurse Practitioner

## 2019-08-28 ENCOUNTER — Ambulatory Visit (INDEPENDENT_AMBULATORY_CARE_PROVIDER_SITE_OTHER): Payer: BC Managed Care – PPO | Admitting: Nurse Practitioner

## 2019-08-28 VITALS — BP 122/78 | HR 88 | Temp 97.6°F | Ht 66.2 in | Wt 179.2 lb

## 2019-08-28 DIAGNOSIS — Z8719 Personal history of other diseases of the digestive system: Secondary | ICD-10-CM

## 2019-08-28 DIAGNOSIS — E538 Deficiency of other specified B group vitamins: Secondary | ICD-10-CM

## 2019-08-28 MED ORDER — CYANOCOBALAMIN 1000 MCG/ML IJ SOLN
1000.0000 ug | Freq: Once | INTRAMUSCULAR | Status: AC
  Administered 2019-08-28: 10:00:00 1000 ug via INTRAMUSCULAR

## 2019-08-28 NOTE — Progress Notes (Signed)
This visit occurred during the SARS-CoV-2 public health emergency.  Safety protocols were in place, including screening questions prior to the visit, additional usage of staff PPE, and extensive cleaning of exam room while observing appropriate contact time as indicated for disinfecting solutions.  Subjective:     Patient ID: Dana Martin , female    DOB: Nov 01, 1965 , 54 y.o.   MRN: [id number]   Chief Complaint  Patient presents with  . B12 Injection    HPI  She is here for Vitamin B12 injection due to her levels were still a little low and she has good benefit. Continues to have improved fatigue.      Past Medical History:  Diagnosis Date  . COPD (chronic obstructive pulmonary disease) ([facility])    denies SOB with daily activities  . Full dentures   . GERD (gastroesophageal reflux disease)   . History of pancreatitis   . Hypothyroidism   . Nasal sinus congestion 05/08/2014   with sinus drainage  . PVC (premature ventricular contraction) 07/07/2017  . Stress incontinence   . Symptomatic cholelithiasis 05/2014  . Varicose veins of bilateral lower extremities with pain   . Wears dentures    upper and lower  . Wears glasses      Family History  Problem Relation Age of Onset  . Hypertension Mother   . Atrial fibrillation Mother   . Heart attack Mother   . Hypertension Sister   . Hypertension Brother   . Hypertension Daughter   . Lung cancer Brother   . Diabetes Sister      Current Outpatient Medications:  .  acetaminophen (TYLENOL) 500 MG tablet, Take 1,000 mg by mouth every 6 (six) hours as needed for moderate pain or headache., Disp: , Rfl:  .  diphenhydrAMINE (BENADRYL) 25 MG tablet, Take 25-50 mg by mouth daily as needed for itching (allergic reaction). , Disp: , Rfl:  .  famotidine (PEPCID) 10 MG tablet, Take 10 mg by mouth daily., Disp: , Rfl:  .  ibuprofen (ADVIL) 200 MG tablet, Take 400-800 mg by mouth every 6 (six) hours as needed for headache or moderate  pain., Disp: , Rfl:  .  levocetirizine (XYZAL) 5 MG tablet, Take 5 mg by mouth at bedtime., Disp: , Rfl:  .  levothyroxine (SYNTHROID) 175 MCG tablet, Take 1 tablet (175 mcg total) by mouth daily. (Patient taking differently: Take 175 mcg by mouth daily. Brand only), Disp: 90 tablet, Rfl: 1 .  Polyethyl Glycol-Propyl Glycol (SYSTANE ULTRA) 0.4-0.3 % SOLN, Place 1 drop into both eyes 5 (five) times daily as needed (dry eyes/irriation)., Disp: , Rfl:  .  Vitamin D, Ergocalciferol, (DRISDOL) 1.25 MG (50000 UT) CAPS capsule, Take 1 capsule (50,000 Units total) by mouth every 7 (seven) days. (Patient taking differently: Take 50,000 Units by mouth every Tuesday. ), Disp: 24 capsule, Rfl: 0   Allergies  Allergen Reactions  . Fish Allergy Hives  . Gadolinium Derivatives Hives, Itching and Rash    Pt stated she felt itching and burning in her throat immediately following the injection.  . Multihance [Gadobenate] Hives, Itching and Rash    Itching and burning in her throat. Hives and itching of her skin.  . Omnipaque [Iohexol] Hives  . Other Nausea And Vomiting    ALL NARCOTICS  . Peanut-Containing Drug Products Hives  . Shellfish Allergy Hives  . Codeine Nausea And Vomiting  . Oxycodone Nausea And Vomiting  . Tramadol     headaches  Review of Systems  Constitutional: Negative.   Respiratory: Negative.  Negative for cough.   Cardiovascular: Negative.  Negative for chest pain, palpitations and leg swelling.  Allergic/Immunologic:       Reports she has seen an allergist in the past and was advised she is allergic to multiple foods.  Has seen Dr. Ishmael Holter in the past.   Neurological: Negative for dizziness and headaches.  Psychiatric/Behavioral: Negative.      Today's Vitals   08/28/19 0940  BP: 122/78  Pulse: 88  Temp: 97.6 F (36.4 C)  TempSrc: Oral  Weight: 179 lb 3.2 oz (81.3 kg)  Height: 5' 6.2" (1.681 m)  PainSc: 0-No pain   Body mass index is 28.75 kg/m.   Objective:   Physical Exam Vitals reviewed.  Constitutional:      Appearance: Normal appearance.  Cardiovascular:     Rate and Rhythm: Normal rate and regular rhythm.     Pulses: Normal pulses.     Heart sounds: Normal heart sounds. No murmur.  Pulmonary:     Effort: Pulmonary effort is normal. No respiratory distress.     Breath sounds: Normal breath sounds.  Skin:    Capillary Refill: Capillary refill takes less than 2 seconds.  Neurological:     General: No focal deficit present.     Mental Status: She is alert and oriented to person, place, and time.     Cranial Nerves: No cranial nerve deficit.  Psychiatric:        Mood and Affect: Mood normal.        Behavior: Behavior normal.        Thought Content: Thought content normal.        Judgment: Judgment normal.         Assessment And Plan:     1. Vitamin B12 deficiency  Continues to have good benefit  She will start taking a vitamin B complex daily  If she is feeling okay she will continue that and wait to get an injection in April - cyanocobalamin ((VITAMIN B-12)) injection 1,000 mcg  2. History of pancreatitis  She had an endoscopy done this month which did not indicate any issues with her pancreas.    [name], FNP    THE PATIENT IS ENCOURAGED TO PRACTICE SOCIAL DISTANCING DUE TO THE COVID-19 PANDEMIC.

## 2019-08-28 NOTE — Patient Instructions (Addendum)
Vitamin B12 Deficiency Vitamin B12 deficiency occurs when the body does not have enough vitamin B12, which is an important vitamin. The body needs this vitamin:  To make red blood cells.  To make DNA. This is the genetic material inside cells.  To help the nerves work properly so they can carry messages from the brain to the body. Vitamin B12 deficiency can cause various health problems, such as a low red blood cell count (anemia) or nerve damage. What are the causes? This condition may be caused by:  Not eating enough foods that contain vitamin B12.  Not having enough stomach acid and digestive fluids to properly absorb vitamin B12 from the food that you eat.  Certain digestive system diseases that make it hard to absorb vitamin B12. These diseases include Crohn's disease, chronic pancreatitis, and cystic fibrosis.  A condition in which the body does not make enough of a protein (intrinsic factor), resulting in too few red blood cells (pernicious anemia).  Having a surgery in which part of the stomach or small intestine is removed.  Taking certain medicines that make it hard for the body to absorb vitamin B12. These medicines include: ? Heartburn medicines (antacids and proton pump inhibitors). ? Certain antibiotic medicines. ? Some medicines that are used to treat diabetes, tuberculosis, gout, or high cholesterol. What increases the risk? The following factors may make you more likely to develop a B12 deficiency:  Being older than age 28.  Eating a vegetarian or vegan diet, especially while you are pregnant.  Eating a poor diet while you are pregnant.  Taking certain medicines.  Having alcoholism. What are the signs or symptoms? In some cases, there are no symptoms of this condition. If the condition leads to anemia or nerve damage, various symptoms can occur, such as:  Weakness.  Fatigue.  Loss of appetite.  Weight loss.  Numbness or tingling in your hands and  feet.  Redness and burning of the tongue.  Confusion or memory problems.  Depression.  Sensory problems, such as color blindness, ringing in the ears, or loss of taste.  Diarrhea or constipation.  Trouble walking. If anemia is severe, symptoms can include:  Shortness of breath.  Dizziness.  Rapid heart rate (tachycardia). How is this diagnosed? This condition may be diagnosed with a blood test to measure the level of vitamin B12 in your blood. You may also have other tests, including:  A group of tests that measure certain characteristics of blood cells (complete blood count, CBC).  A blood test to measure intrinsic factor.  A procedure where a thin tube with a camera on the end is used to look into your stomach or intestines (endoscopy). Other tests may be needed to discover the cause of B12 deficiency. How is this treated? Treatment for this condition depends on the cause. This condition may be treated by:  Changing your eating and drinking habits, such as: ? Eating more foods that contain vitamin B12. ? Drinking less alcohol or no alcohol.  Getting vitamin B12 injections.  Taking vitamin B12 supplements. Your health care provider will tell you which dosage is best for you. Follow these instructions at home: Eating and drinking   Eat lots of healthy foods that contain vitamin B12, including: ? Meats and poultry. This includes beef, pork, chicken, [place], and organ meats, such as liver. ? Seafood. This includes clams, rainbow trout, salmon, tuna, and haddock. ? Eggs. ? Cereal and dairy products that are fortified. This means that vitamin B12  has been added to the food. Check the label on the package to see if the food is fortified. The items listed above may not be a complete list of recommended foods and beverages. Contact a dietitian for more information. General instructions  Get any injections that are prescribed by your health care provider.  Take  supplements only as told by your health care provider. Follow the directions carefully.  Do not drink alcohol if your health care provider tells you not to. In some cases, you may only be asked to limit alcohol use.  Keep all follow-up visits as told by your health care provider. This is important. Contact a health care provider if:  Your symptoms come back. Get help right away if you:  Develop shortness of breath.  Have a rapid heart rate.  Have chest pain.  Become dizzy or lose consciousness. Summary  Vitamin B12 deficiency occurs when the body does not have enough vitamin B12.  The main causes of vitamin B12 deficiency include dietary deficiency, digestive diseases, pernicious anemia, and having a surgery in which part of the stomach or small intestine is removed.  In some cases, there are no symptoms of this condition. If the condition leads to anemia or nerve damage, various symptoms can occur, such as weakness, shortness of breath, and numbness.  Treatment may include getting vitamin B12 injections or taking vitamin B12 supplements. Eat lots of healthy foods that contain vitamin B12. This information is not intended to replace advice given to you by your health care provider. Make sure you discuss any questions you have with your health care provider. Document Revised: 01/11/2019 Document Reviewed: 04/03/2018 Elsevier Patient Education  2020 [employer].   Take on over the counter vitamin B complex vitamin daily.

## 2019-09-04 ENCOUNTER — Encounter: Payer: Self-pay | Admitting: Allergy

## 2019-09-04 ENCOUNTER — Ambulatory Visit: Payer: BC Managed Care – PPO | Admitting: Allergy

## 2019-09-04 ENCOUNTER — Other Ambulatory Visit: Payer: Self-pay

## 2019-09-04 VITALS — BP 134/82 | HR 88 | Temp 98.6°F | Resp 18 | Ht 65.5 in | Wt 178.0 lb

## 2019-09-04 DIAGNOSIS — T781XXD Other adverse food reactions, not elsewhere classified, subsequent encounter: Secondary | ICD-10-CM | POA: Diagnosis not present

## 2019-09-04 DIAGNOSIS — T781XXA Other adverse food reactions, not elsewhere classified, initial encounter: Secondary | ICD-10-CM | POA: Insufficient documentation

## 2019-09-04 HISTORY — DX: Other adverse food reactions, not elsewhere classified, initial encounter: T78.1XXA

## 2019-09-04 NOTE — Progress Notes (Signed)
New Patient Note  RE: Dana Martin MRN: [id number] DOB: 1966-04-26 Date of Office Visit: 09/04/2019  Referring provider: Arnette Felts, FNP Primary care provider: Arnette Felts, FNP  Chief Complaint: Food Intolerance (seen 10+ years ago. she has peanut allergy already, along with shellfish, fin fish, tree nuts. she is here today to get more in depth testing to see what she is able to ear. she has had some pancreatic issues and wants to eat healthier. )  History of Present Illness: I had the pleasure of seeing Dana Martin for initial evaluation at the [facility] on 09/04/2019. She is a 54 y.o. female, who is referred here by Arnette Felts, FNP for the evaluation of food allergies.  Food: She reports food allergy to peanuts, tree nuts, tuna and legumes. The reaction occurred about 10 years ago, after she ate small amounts of these foods. Symptoms started within minutes and was in the form of perioral hives and roof of her mouth would swell. Denies any wheezing, abdominal pain, diarrhea, vomiting. Denies any associated cofactors such as exertion, infection, NSAID use. The symptoms lasted for 1 hour after benadryl. She was not evaluated in ED. Since this episode, she does report other accidental exposures to peanuts/tree nuts but she would take benadryl afterwards with no significant clinical reactions. She does not have access to epinephrine autoinjector.   Past work up includes: skin testing over 10 years ago showed multiple positives - seafood, peanuts, tree nuts per patient report.  Dietary History: patient has been eating other foods including milk, eggs, sesame, soy, wheat, meats, fruits and vegetables.  She reports reading labels and avoiding shellfish, seafood, legumes in diet completely.   The last 5-6 years patient has been having issues with her pancreas and she needs to figure out what she can eat.   Assessment and Plan: Dana Martin is a 54 y.o. female  with: Adverse food reaction Reactions to peanuts, tree nuts, tuna, legumes 10 years ago in the form of perioral hives and questionable mouth swelling.  Skin testing at that time showed multiple positives per patient report.  Having issues with her pancreas and wants to know if she is still allergic to the above foods.  Today's skin testing showed: Borderline positive to cashew. Negative to peanuts, other tree nuts, seafood, shellfish and legumes.  Continue to avoid peanuts, tree nuts, seafood, shellfish and legumes as before. Get bloodwork. If bloodwork is negative then will give you more instructions on scheduling the in office food challenge until then no change in diet.   For mild symptoms you can take over the counter antihistamines such as Benadryl and monitor symptoms closely. If symptoms worsen or if you have severe symptoms including breathing issues, throat closure, significant swelling, whole body hives, severe diarrhea and vomiting, lightheadedness then seek immediate medical care.  Food action plan given.  Return in about 1 year (around 09/03/2020).  Lab Orders     IgE Nut Prof. w/Component Rflx     Allergen Profile, Food-Fish     Allergen Profile, Shellfish     Allergen Pea f12     Allergen, Kidney Bean, [id number]     Allergen, Francoise Ceo, Rf315     Allergen, Bean Black  Other allergy screening: Asthma: no Rhino conjunctivitis: yes Mild rhino conjunctivitis symptoms and takes Xyzal with good benefit.  Medication allergy: yes Hymenoptera allergy: no Urticaria: no Eczema: no History of recurrent infections suggestive of immunodeficency: no  Diagnostics: Skin Testing: Select foods.  Positive test to: borderline to cashews. Negative to peanuts, other tree nuts, seafood, shellfish and legumes. Results discussed with patient/family. Food Adult Perc - 09/04/19 0900    Time Antigen Placed  3086    Allergen Manufacturer  Waynette Buttery    Location  Back    Number of allergen test  24      Control-buffer 50% Glycerol  Negative    Control-Histamine 1 mg/ml  2+    1. Peanut  Negative    8. Shellfish Mix  Negative    9. Fish Mix  Negative    10. Cashew  --   +/-   11. Pecan Food  Negative    12. Walnut Food  Negative    13. Almond  Negative    14. Hazelnut  Negative    15. [place] nut  Negative    16. Coconut  Negative    17. Pistachio  Negative    18. Catfish  Negative    19. Bass  Negative    20. Trout  Negative    21. Tuna  Negative    22. Salmon  Negative    23. Flounder  Negative    24. Codfish  Negative    25. Shrimp  Negative    26. Crab  Negative    27. Lobster  Negative    28. Oyster  Negative    29. Scallops  Negative    45. Pea, Green/English  Negative    46. Navy Bean  Negative       Past Medical History: Patient Active Problem List   Diagnosis Date Noted  . Adverse food reaction 09/04/2019  . Vitamin B12 deficiency 06/26/2019  . Hyperlipidemia 05/07/2018  . PVC (premature ventricular contraction) 07/07/2017  . Leukocytosis 03/16/2015  . Environmental allergies 05/01/2014  . Rhinitis, allergic 05/01/2014  . Acute pancreatitis 12/06/2013  . Hypothyroidism 12/06/2013  . Allergic rhinitis 12/06/2013  . Health maintenance examination 05/29/2013  . Hyponatremia 05/09/2013  . Pancreatitis 05/08/2013   Past Medical History:  Diagnosis Date  . Angio-edema   . COPD (chronic obstructive pulmonary disease) (HCC)    denies SOB with daily activities  . Full dentures   . GERD (gastroesophageal reflux disease)   . History of pancreatitis   . Hypothyroidism   . Nasal sinus congestion 05/08/2014   with sinus drainage  . PVC (premature ventricular contraction) 07/07/2017  . Stress incontinence   . Symptomatic cholelithiasis 05/2014  . Urticaria   . Varicose veins of bilateral lower extremities with pain   . Wears dentures    upper and lower  . Wears glasses    Past Surgical History: Past Surgical History:  Procedure Laterality Date  .  BREAST BIOPSY Right pt unsure   benign  . BREAST EXCISIONAL BIOPSY Left pt unsure   benign  . BREAST LUMPECTOMY    . CHOLECYSTECTOMY N/A 05/13/2014   Procedure: LAPAROSCOPIC CHOLECYSTECTOMY WITH INTRAOPERATIVE CHOLANGIOGRAM;  Surgeon: Abigail Miyamoto, MD;  Location: [facility];  Service: General;  Laterality: N/A;  . ESOPHAGOGASTRODUODENOSCOPY (EGD) WITH PROPOFOL N/A 08/23/2019   Procedure: ESOPHAGOGASTRODUODENOSCOPY (EGD) WITH PROPOFOL;  Surgeon: Jeani Hawking, MD;  Location: [facility];  Service: Endoscopy;  Laterality: N/A;  . EUS N/A 02/19/2014   Procedure: ESOPHAGEAL ENDOSCOPIC ULTRASOUND (EUS) RADIAL;  Surgeon: Willis Modena, MD;  Location: [facility];  Service: Endoscopy;  Laterality: N/A;  . TUBAL LIGATION    . UPPER ESOPHAGEAL ENDOSCOPIC ULTRASOUND (EUS) N/A 08/23/2019   Procedure: UPPER ESOPHAGEAL ENDOSCOPIC ULTRASOUND (EUS);  Surgeon: Jeani HawkingHung, Patrick, MD;  [facility];  Service: Endoscopy;  Laterality: N/A;  . VAGINAL HYSTERECTOMY  11/13/2001   transvaginal    Medication List:  Current Outpatient Medications  Medication Sig Dispense Refill  . acetaminophen (TYLENOL) 500 MG tablet Take 1,000 mg by mouth every 6 (six) hours as needed for moderate pain or headache.    . diphenhydrAMINE (BENADRYL) 25 MG tablet Take 25-50 mg by mouth daily as needed for itching (allergic reaction).     . famotidine (PEPCID) 10 MG tablet Take 10 mg by mouth daily.    Marland Kitchen. ibuprofen (ADVIL) 200 MG tablet Take 400-800 mg by mouth every 6 (six) hours as needed for headache or moderate pain.    Marland Kitchen. levocetirizine (XYZAL) 5 MG tablet Take 5 mg by mouth at bedtime.    Marland Kitchen. levothyroxine (SYNTHROID) 175 MCG tablet Take 1 tablet (175 mcg total) by mouth daily. 90 tablet 1  . Polyethyl Glycol-Propyl Glycol (SYSTANE ULTRA) 0.4-0.3 % SOLN Place 1 drop into both eyes 5 (five) times daily as needed (dry eyes/irriation).    . Vitamin D, Ergocalciferol, (DRISDOL) 1.25 MG (50000 UT) CAPS capsule Take  1 capsule (50,000 Units total) by mouth every 7 (seven) days. (Patient taking differently: Take 50,000 Units by mouth every Tuesday. ) 24 capsule 0   No current facility-administered medications for this visit.   Allergies: Allergies  Allergen Reactions  . Fish Allergy Hives  . Gadolinium Derivatives Hives, Itching and Rash    Pt stated she felt itching and burning in her throat immediately following the injection.  . Multihance [Gadobenate] Hives, Itching and Rash    Itching and burning in her throat. Hives and itching of her skin.  . Omnipaque [Iohexol] Hives  . Other Nausea And Vomiting    ALL NARCOTICS  . Peanut-Containing Drug Products Hives  . Shellfish Allergy Hives  . Codeine Nausea And Vomiting  . Oxycodone Nausea And Vomiting  . Tramadol     headaches   Social History: Social History   Socioeconomic History  . Marital status: Married    Spouse name: Not on file  . Number of children: Not on file  . Years of education: Not on file  . Highest education level: Not on file  Occupational History  . Not on file  Tobacco Use  . Smoking status: Current Every Day Smoker    Packs/day: 0.50    Types: Cigarettes  . Smokeless tobacco: Never Used  . Tobacco comment: quit for six years, started back 09-2018  Substance and Sexual Activity  . Alcohol use: Not Currently  . Drug use: No  . Sexual activity: Yes  Other Topics Concern  . Not on file  Social History Narrative  . Not on file   Social Determinants of Health   Financial Resource Strain:   . Difficulty of Paying Living Expenses: Not on file  Food Insecurity:   . Worried About Programme researcher, broadcasting/film/videounning Out of Food in the Last Year: Not on file  . Ran Out of Food in the Last Year: Not on file  Transportation Needs:   . Lack of Transportation (Medical): Not on file  . Lack of Transportation (Non-Medical): Not on file  Physical Activity:   . Days of Exercise per Week: Not on file  . Minutes of Exercise per Session: Not on file   Stress:   . Feeling of Stress : Not on file  Social Connections:   . Frequency of Communication with Friends and Family: Not on  file  . Frequency of Social Gatherings with Friends and Family: Not on file  . Attends Religious Services: Not on file  . Active Member of Clubs or Organizations: Not on file  . Attends Archivist Meetings: Not on file  . Marital Status: Not on file   Lives in a house which is 56 years old. Smoking: quit for 6 years and now started smoking again for the past year.  Occupation: works at [employer] History: Environmental education officer in the house: no Charity fundraiser in the family room: no Carpet in the bedroom: no Heating: electric Cooling: central Pet: yes 1 dog x 9 yrs  Family History: Family History  Problem Relation Age of Onset  . Hypertension Mother   . Atrial fibrillation Mother   . Heart attack Mother   . Hypertension Sister   . Hypertension Brother   . Hypertension Daughter   . Lung cancer Brother   . Diabetes Sister    Problem                               Relation Asthma                                   Daughter  Eczema                                No  Food allergy                          No  Allergic rhino conjunctivitis     Daughter   Review of Systems  Constitutional: Negative for appetite change, chills, fever and unexpected weight change.  HENT: Negative for congestion and rhinorrhea.   Eyes: Negative for itching.  Respiratory: Negative for cough, chest tightness, shortness of breath and wheezing.   Cardiovascular: Negative for chest pain.  Gastrointestinal: Negative for abdominal pain, nausea and vomiting.  Genitourinary: Negative for difficulty urinating.  Skin: Negative for rash.  Neurological: Negative for headaches.   Objective: BP 134/82 (BP Location: Right Arm, Patient Position: Sitting, Cuff Size: Normal)   Pulse 88   Temp 98.6 F (37 C) (Temporal)   Resp 18   Ht 5' 5.5" (1.664 m)   Wt 178 lb (80.7  kg)   SpO2 97%   BMI 29.17 kg/m  Body mass index is 29.17 kg/m. Physical Exam  Constitutional: She is oriented to person, place, and time. She appears well-developed and well-nourished.  HENT:  Head: Normocephalic and atraumatic.  Right Ear: External ear normal.  Left Ear: External ear normal.  Nose: Nose normal.  Mouth/Throat: Oropharynx is clear and moist.  Eyes: Conjunctivae and EOM are normal.  Cardiovascular: Normal rate, regular rhythm and normal heart sounds. Exam reveals no gallop and no friction rub.  No murmur heard. Pulmonary/Chest: Effort normal and breath sounds normal. She has no wheezes. She has no rales.  Abdominal: Soft.  Musculoskeletal:     Cervical back: Neck supple.  Neurological: She is alert and oriented to person, place, and time.  Skin: Skin is warm. No rash noted.  Psychiatric: She has a normal mood and affect. Her behavior is normal.  Nursing note and vitals reviewed.  The plan was reviewed with the patient/family, and all questions/concerned were addressed.  It was my pleasure to see Dana Martin today and participate in her care. Please feel free to contact me with any questions or concerns.  Sincerely,  Wyline Mood, DO Allergy & Immunology  [facility] office: [phone] [facility] office: [phone] [place] office: [phone]

## 2019-09-04 NOTE — Assessment & Plan Note (Signed)
Reactions to peanuts, tree nuts, tuna, legumes 10 years ago in the form of perioral hives and questionable mouth swelling.  Skin testing at that time showed multiple positives per patient report.  Having issues with her pancreas and wants to know if she is still allergic to the above foods.  Today's skin testing showed: Borderline positive to cashew. Negative to peanuts, other tree nuts, seafood, shellfish and legumes.  Continue to avoid peanuts, tree nuts, seafood, shellfish and legumes as before. Get bloodwork. If bloodwork is negative then will give you more instructions on scheduling the in office food challenge until then no change in diet.   For mild symptoms you can take over the counter antihistamines such as Benadryl and monitor symptoms closely. If symptoms worsen or if you have severe symptoms including breathing issues, throat closure, significant swelling, whole body hives, severe diarrhea and vomiting, lightheadedness then seek immediate medical care.  Food action plan given.

## 2019-09-04 NOTE — Patient Instructions (Addendum)
Today's skin testing showed: Borderline positive to cashew. Negative to peanuts, other tree nuts, seafood, shellfish and legumes.  Food:  Continue to avoid peanuts, tree nuts, seafood, shellfish and legumes as before. No change in diet.   For mild symptoms you can take over the counter antihistamines such as Benadryl and monitor symptoms closely. If symptoms worsen or if you have severe symptoms including breathing issues, throat closure, significant swelling, whole body hives, severe diarrhea and vomiting, lightheadedness then seek immediate medical care.  Food action plan given.  Get bloodwork:  We are ordering labs, so please allow 1-2 weeks for the results to come back. With the newly implemented Cures Act, the labs might be visible to you at the same time that they become visible to me. However, I will not address the results until all of the results are back, so please be patient.  In the meantime, continue recommendations in your patient instructions, including avoidance measures (if applicable), until you hear from me.  If bloodwork is negative then will give you more instructions on scheduling the in office food challenge.   Follow up in 1 year or sooner if needed.

## 2019-09-09 LAB — IGE NUT PROF. W/COMPONENT RFLX
F017-IgE Hazelnut (Filbert): <0.1 kU/L
F018-IgE Brazil Nut: <0.1 kU/L
F020-IgE Almond: <0.1 kU/L
F202-IgE Cashew Nut: <0.1 kU/L
F203-IgE Pistachio Nut: <0.1 kU/L
F256-IgE Walnut: <0.1 kU/L
Macadamia Nut, IgE: <0.1 kU/L
Peanut, IgE: <0.1 kU/L
Pecan Nut IgE: <0.1 kU/L

## 2019-09-09 LAB — ALLERGEN, BEAN BLACK
Black Bean*, IgE: <0.35 kU/L (ref <0.35)
Class Interpretation: 0

## 2019-09-09 LAB — ALLERGEN PROFILE, FOOD-FISH
Allergen Mackerel IgE: <0.1 kU/L
Allergen Salmon IgE: <0.1 kU/L
Allergen Trout IgE: <0.1 kU/L
Allergen Walley Pike IgE: <0.1 kU/L
Codfish IgE: <0.1 kU/L
Halibut IgE: <0.1 kU/L
Tuna: <0.1 kU/L

## 2019-09-09 LAB — ALLERGEN, GREEN BEAN, RF315: Allergen Green Bean IgE: <0.1 kU/L

## 2019-09-09 LAB — ALLERGEN, KIDNEY BEAN, RF287: Kidney Bean IgE: <0.1 kU/L

## 2019-09-09 LAB — ALLERGEN PEA F12: Allergen Green Pea IgE: <0.1 kU/L

## 2019-09-09 LAB — ALLERGEN PROFILE, SHELLFISH
Clam IgE: <0.1 kU/L
F023-IgE Crab: <0.1 kU/L
F080-IgE Lobster: <0.1 kU/L
F290-IgE Oyster: <0.1 kU/L
Scallop IgE: 0.12 kU/L — AB
Shrimp IgE: 0.14 kU/L — AB

## 2019-10-02 ENCOUNTER — Ambulatory Visit: Payer: BC Managed Care – PPO | Admitting: Nurse Practitioner

## 2019-10-07 ENCOUNTER — Ambulatory Visit (INDEPENDENT_AMBULATORY_CARE_PROVIDER_SITE_OTHER): Payer: BC Managed Care – PPO | Admitting: Nurse Practitioner

## 2019-10-07 ENCOUNTER — Other Ambulatory Visit: Payer: Self-pay

## 2019-10-07 ENCOUNTER — Encounter: Payer: Self-pay | Admitting: Nurse Practitioner

## 2019-10-07 VITALS — BP 116/70 | HR 98 | Temp 98.0°F | Ht 66.2 in | Wt 180.2 lb

## 2019-10-07 DIAGNOSIS — E538 Deficiency of other specified B group vitamins: Secondary | ICD-10-CM

## 2019-10-07 DIAGNOSIS — Z720 Tobacco use: Secondary | ICD-10-CM

## 2019-10-07 MED ORDER — CYANOCOBALAMIN 1000 MCG/ML IJ SOLN
1000.0000 ug | Freq: Once | INTRAMUSCULAR | Status: AC
  Administered 2019-10-07: 1000 ug via INTRAMUSCULAR

## 2019-10-07 MED ORDER — BUPROPION HCL ER (XL) 150 MG PO TB24: 150.0000 mg | ORAL_TABLET | Freq: Every day | ORAL | 1 refills | Status: DC

## 2019-10-07 NOTE — Progress Notes (Signed)
This visit occurred during the SARS-CoV-2 public health emergency.  Safety protocols were in place, including screening questions prior to the visit, additional usage of staff PPE, and extensive cleaning of exam room while observing appropriate contact time as indicated for disinfecting solutions.  Subjective:     Patient ID: Dana Martin , female    DOB: 1965/11/20 , 54 y.o.   MRN: [id number]   Chief Complaint  Patient presents with  . B12 Injection    patient presents today for her monthly vitamin B12    HPI  She is here for vitamin B12 injection for the month. She feels her fatigue level has decreased since that time. She has done nicotine gum which did not do well.     Past Medical History:  Diagnosis Date  . Angio-edema   . COPD (chronic obstructive pulmonary disease) ([place])    denies SOB with daily activities  . Full dentures   . GERD (gastroesophageal reflux disease)   . History of pancreatitis   . Hypothyroidism   . Nasal sinus congestion 05/08/2014   with sinus drainage  . PVC (premature ventricular contraction) 07/07/2017  . Stress incontinence   . Symptomatic cholelithiasis 05/2014  . Urticaria   . Varicose veins of bilateral lower extremities with pain   . Wears dentures    upper and lower  . Wears glasses      Family History  Problem Relation Age of Onset  . Hypertension Mother   . Atrial fibrillation Mother   . Heart attack Mother   . Hypertension Sister   . Hypertension Brother   . Hypertension Daughter   . Lung cancer Brother   . Diabetes Sister      Current Outpatient Medications:  .  acetaminophen (TYLENOL) 500 MG tablet, Take 1,000 mg by mouth every 6 (six) hours as needed for moderate pain or headache., Disp: , Rfl:  .  diphenhydrAMINE (BENADRYL) 25 MG tablet, Take 25-50 mg by mouth daily as needed for itching (allergic reaction). , Disp: , Rfl:  .  famotidine (PEPCID) 10 MG tablet, Take 10 mg by mouth daily., Disp: , Rfl:  .  ibuprofen  (ADVIL) 200 MG tablet, Take 400-800 mg by mouth every 6 (six) hours as needed for headache or moderate pain., Disp: , Rfl:  .  levocetirizine (XYZAL) 5 MG tablet, Take 5 mg by mouth at bedtime., Disp: , Rfl:  .  levothyroxine (SYNTHROID) 175 MCG tablet, Take 1 tablet (175 mcg total) by mouth daily., Disp: 90 tablet, Rfl: 1 .  Polyethyl Glycol-Propyl Glycol (SYSTANE ULTRA) 0.4-0.3 % SOLN, Place 1 drop into both eyes 5 (five) times daily as needed (dry eyes/irriation)., Disp: , Rfl:  .  Vitamin D, Ergocalciferol, (DRISDOL) 1.25 MG (50000 UT) CAPS capsule, Take 1 capsule (50,000 Units total) by mouth every 7 (seven) days., Disp: 24 capsule, Rfl: 0   Allergies  Allergen Reactions  . Fish Allergy Hives  . Gadolinium Derivatives Hives, Itching and Rash    Pt stated she felt itching and burning in her throat immediately following the injection.  . Multihance [Gadobenate] Hives, Itching and Rash    Itching and burning in her throat. Hives and itching of her skin.  . Omnipaque [Iohexol] Hives  . Other Nausea And Vomiting    ALL NARCOTICS  . Peanut-Containing Drug Products Hives  . Shellfish Allergy Hives  . Codeine Nausea And Vomiting  . Oxycodone Nausea And Vomiting  . Tramadol     headaches  Review of Systems  Constitutional: Negative.   Respiratory: Negative.   Cardiovascular: Negative.  Negative for chest pain, palpitations and leg swelling.  Neurological: Negative for dizziness and headaches.  Psychiatric/Behavioral: Negative.      Today's Vitals   10/07/19 1150  BP: 116/70  Pulse: 98  Temp: 98 F (36.7 C)  Weight: 180 lb 3.2 oz (81.7 kg)  Height: 5' 6.2" (1.681 m)  PainSc: 0-No pain   Body mass index is 28.91 kg/m.   Objective:  Physical Exam Constitutional:      General: She is not in acute distress.    Appearance: Normal appearance.  Cardiovascular:     Rate and Rhythm: Normal rate and regular rhythm.     Pulses: Normal pulses.     Heart sounds: Normal heart  sounds. No murmur.  Pulmonary:     Effort: Pulmonary effort is normal. No respiratory distress.     Breath sounds: Normal breath sounds.  Neurological:     General: No focal deficit present.     Mental Status: She is alert and oriented to person, place, and time.     Cranial Nerves: No cranial nerve deficit.  Psychiatric:        Mood and Affect: Mood normal.        Behavior: Behavior normal.        Thought Content: Thought content normal.        Judgment: Judgment normal.         Assessment And Plan:     1. Vitamin B12 deficiency  She is doing well   Vitamin B12 given in office  2. Tobacco abuse  She is interested in quitting smoking will start on bupropion - buPROPion (WELLBUTRIN XL) 150 MG 24 hr tablet; Take 1 tablet (150 mg total) by mouth daily.  Dispense: 30 tablet; Refill: 1    Arnette Felts, FNP    THE PATIENT IS ENCOURAGED TO PRACTICE SOCIAL DISTANCING DUE TO THE COVID-19 PANDEMIC.

## 2019-11-22 ENCOUNTER — Other Ambulatory Visit: Payer: Self-pay | Admitting: Nurse Practitioner

## 2019-11-22 DIAGNOSIS — Z720 Tobacco use: Secondary | ICD-10-CM

## 2019-11-27 DIAGNOSIS — K8500 Idiopathic acute pancreatitis without necrosis or infection: Secondary | ICD-10-CM | POA: Diagnosis not present

## 2019-11-27 DIAGNOSIS — R197 Diarrhea, unspecified: Secondary | ICD-10-CM | POA: Diagnosis not present

## 2019-12-04 ENCOUNTER — Encounter: Payer: Self-pay | Admitting: Nurse Practitioner

## 2019-12-04 ENCOUNTER — Ambulatory Visit (INDEPENDENT_AMBULATORY_CARE_PROVIDER_SITE_OTHER): Payer: BC Managed Care – PPO | Admitting: Nurse Practitioner

## 2019-12-04 ENCOUNTER — Other Ambulatory Visit: Payer: Self-pay

## 2019-12-04 VITALS — BP 128/80 | HR 87 | Temp 98.3°F | Ht 66.2 in | Wt 181.8 lb

## 2019-12-04 DIAGNOSIS — E039 Hypothyroidism, unspecified: Secondary | ICD-10-CM

## 2019-12-04 DIAGNOSIS — Z0000 Encounter for general adult medical examination without abnormal findings: Secondary | ICD-10-CM

## 2019-12-04 DIAGNOSIS — E559 Vitamin D deficiency, unspecified: Secondary | ICD-10-CM | POA: Diagnosis not present

## 2019-12-04 DIAGNOSIS — E538 Deficiency of other specified B group vitamins: Secondary | ICD-10-CM | POA: Diagnosis not present

## 2019-12-04 DIAGNOSIS — M79671 Pain in right foot: Secondary | ICD-10-CM

## 2019-12-04 DIAGNOSIS — E785 Hyperlipidemia, unspecified: Secondary | ICD-10-CM

## 2019-12-04 DIAGNOSIS — Z720 Tobacco use: Secondary | ICD-10-CM

## 2019-12-04 DIAGNOSIS — M79672 Pain in left foot: Secondary | ICD-10-CM

## 2019-12-04 MED ORDER — CYANOCOBALAMIN 1000 MCG/ML IJ SOLN
1000.0000 ug | Freq: Once | INTRAMUSCULAR | Status: AC
  Administered 2019-12-04: 1000 ug via INTRAMUSCULAR

## 2019-12-04 MED ORDER — CYANOCOBALAMIN 1000 MCG/ML IJ SOLN: 1000.0000 ug | Freq: Once | INTRAMUSCULAR | Status: DC

## 2019-12-04 NOTE — Patient Instructions (Signed)
 Health Maintenance, Female Adopting a healthy lifestyle and getting preventive care are important in promoting health and wellness. Ask your health care provider about:  The right schedule for you to have regular tests and exams.  Things you can do on your own to prevent diseases and keep yourself healthy. What should I know about diet, weight, and exercise? Eat a healthy diet   Eat a diet that includes plenty of vegetables, fruits, low-fat dairy products, and lean protein.  Do not eat a lot of foods that are high in solid fats, added sugars, or sodium. Maintain a healthy weight Body mass index (BMI) is used to identify weight problems. It estimates body fat based on height and weight. Your health care provider can help determine your BMI and help you achieve or maintain a healthy weight. Get regular exercise Get regular exercise. This is one of the most important things you can do for your health. Most adults should:  Exercise for at least 150 minutes each week. The exercise should increase your heart rate and make you sweat (moderate-intensity exercise).  Do strengthening exercises at least twice a week. This is in addition to the moderate-intensity exercise.  Spend less time sitting. Even light physical activity can be beneficial. Watch cholesterol and blood lipids Have your blood tested for lipids and cholesterol at 56 years of age, then have this test every 5 years. Have your cholesterol levels checked more often if:  Your lipid or cholesterol levels are high.  You are older than 56 years of age.  You are at high risk for heart disease. What should I know about cancer screening? Depending on your health history and family history, you may need to have cancer screening at various ages. This may include screening for:  Breast cancer.  Cervical cancer.  Colorectal cancer.  Skin cancer.  Lung cancer. What should I know about heart disease, diabetes, and high blood  pressure? Blood pressure and heart disease  High blood pressure causes heart disease and increases the risk of stroke. This is more likely to develop in people who have high blood pressure readings, are of African descent, or are overweight.  Have your blood pressure checked: ? Every 3-5 years if you are 18-39 years of age. ? Every year if you are 40 years old or older. Diabetes Have regular diabetes screenings. This checks your fasting blood sugar level. Have the screening done:  Once every three years after age 40 if you are at a normal weight and have a low risk for diabetes.  More often and at a younger age if you are overweight or have a high risk for diabetes. What should I know about preventing infection? Hepatitis B If you have a higher risk for hepatitis B, you should be screened for this virus. Talk with your health care provider to find out if you are at risk for hepatitis B infection. Hepatitis C Testing is recommended for:  Everyone born from 1945 through 1965.  Anyone with known risk factors for hepatitis C. Sexually transmitted infections (STIs)  Get screened for STIs, including gonorrhea and chlamydia, if: ? You are sexually active and are younger than 56 years of age. ? You are older than 56 years of age and your health care provider tells you that you are at risk for this type of infection. ? Your sexual activity has changed since you were last screened, and you are at increased risk for chlamydia or gonorrhea. Ask your health care provider   if you are at risk.  Ask your health care provider about whether you are at high risk for HIV. Your health care provider may recommend a prescription medicine to help prevent HIV infection. If you choose to take medicine to prevent HIV, you should first get tested for HIV. You should then be tested every 3 months for as long as you are taking the medicine. Pregnancy  If you are about to stop having your period (premenopausal) and  you may become pregnant, seek counseling before you get pregnant.  Take 400 to 800 micrograms (mcg) of folic acid every day if you become pregnant.  Ask for birth control (contraception) if you want to prevent pregnancy. Osteoporosis and menopause Osteoporosis is a disease in which the bones lose minerals and strength with aging. This can result in bone fractures. If you are 65 years old or older, or if you are at risk for osteoporosis and fractures, ask your health care provider if you should:  Be screened for bone loss.  Take a calcium or vitamin D supplement to lower your risk of fractures.  Be given hormone replacement therapy (HRT) to treat symptoms of menopause. Follow these instructions at home: Lifestyle  Do not use any products that contain nicotine or tobacco, such as cigarettes, e-cigarettes, and chewing tobacco. If you need help quitting, ask your health care provider.  Do not use street drugs.  Do not share needles.  Ask your health care provider for help if you need support or information about quitting drugs. Alcohol use  Do not drink alcohol if: ? Your health care provider tells you not to drink. ? You are pregnant, may be pregnant, or are planning to become pregnant.  If you drink alcohol: ? Limit how much you use to 0-1 drink a day. ? Limit intake if you are breastfeeding.  Be aware of how much alcohol is in your drink. In the [place] one drink equals one 12 oz bottle of beer (355 mL), one 5 oz glass of wine (148 mL), or one 1 oz glass of hard liquor (44 mL). General instructions  Schedule regular health, dental, and eye exams.  Stay current with your vaccines.  Tell your health care provider if: ? You often feel depressed. ? You have ever been abused or do not feel safe at home. Summary  Adopting a healthy lifestyle and getting preventive care are important in promoting health and wellness.  Follow your health care provider's instructions about healthy  diet, exercising, and getting tested or screened for diseases.  Follow your health care provider's instructions on monitoring your cholesterol and blood pressure. This information is not intended to replace advice given to you by your health care provider. Make sure you discuss any questions you have with your health care provider. Document Revised: 07/18/2018 Document Reviewed: 07/18/2018 Elsevier Patient Education  2020 Elsevier Inc.  American Heart Association (AHA) Exercise Recommendation  Being physically active is important to prevent heart disease and stroke, the nation's No. 1and No. 5killers. To improve overall cardiovascular health, we suggest at least 150 minutes per week of moderate exercise or 75 minutes per week of vigorous exercise (or a combination of moderate and vigorous activity). Thirty minutes a day, five times a week is an easy goal to remember. You will also experience benefits even if you divide your time into two or three segments of 10 to 15 minutes per day.  For people who would benefit from lowering their blood pressure or cholesterol, we   recommend 40 minutes of aerobic exercise of moderate to vigorous intensity three to four times a week to lower the risk for heart attack and stroke.  Physical activity is anything that makes you move your body and burn calories.  This includes things like climbing stairs or playing sports. Aerobic exercises benefit your heart, and include walking, jogging, swimming or biking. Strength and stretching exercises are best for overall stamina and flexibility.  The simplest, positive change you can make to effectively improve your heart health is to start walking. It's enjoyable, free, easy, social and great exercise. A walking program is flexible and boasts high success rates because people can stick with it. It's easy for walking to become a regular and satisfying part of life.   For Overall Cardiovascular Health:  At least 30 minutes  of moderate-intensity aerobic activity at least 5 days per week for a total of 150  OR   At least 25 minutes of vigorous aerobic activity at least 3 days per week for a total of 75 minutes; or a combination of moderate- and vigorous-intensity aerobic activity  AND   Moderate- to high-intensity muscle-strengthening activity at least 2 days per week for additional health benefits.  For Lowering Blood Pressure and Cholesterol  An average 40 minutes of moderate- to vigorous-intensity aerobic activity 3 or 4 times per week  What if I can't make it to the time goal? Something is always better than nothing! And everyone has to start somewhere. Even if you've been sedentary for years, today is the day you can begin to make healthy changes in your life. If you don't think you'll make it for 30 or 40 minutes, set a reachable goal for today. You can work up toward your overall goal by increasing your time as you get stronger. Don't let all-or-nothing thinking rob you of doing what you can every day.  Source:http://www.heart.org    

## 2019-12-04 NOTE — Progress Notes (Signed)
This visit occurred during the SARS-CoV-2 public health emergency.  Safety protocols were in place, including screening questions prior to the visit, additional usage of staff PPE, and extensive cleaning of exam room while observing appropriate contact time as indicated for disinfecting solutions.  Subjective:     Patient ID: Dana Martin , female    DOB: 1966-01-06 , 54 y.o.   MRN: [id number]   Chief Complaint  Patient presents with  . Annual Exam    HPI  HPI   The patient states she uses status post hysterectomy. Negative for Dysmenorrhea and Negative for Menorrhagia Mammogram last done 01/23/2019.  Negative for: breast discharge, breast lump(s), breast pain and breast self exam.  Pertinent negatives include abnormal bleeding (hematology), anxiety, decreased libido, depression, difficulty falling sleep, dyspareunia, history of infertility, nocturia, sexual dysfunction, sleep disturbances, urinary incontinence, urinary urgency, vaginal discharge and vaginal itching. Diet regular; she is eating small meals, avoiding greasy foods. The patient states her exercise level is minimal.   The patient's tobacco use is:  Social History   Tobacco Use  Smoking Status Current Every Day Smoker  . Packs/day: 0.50  . Types: Cigarettes  Smokeless Tobacco Never Used  Tobacco Comment   quit for six years, started back 09-2018   She has been exposed to passive smoke. The patient's alcohol use is:  Social History   Substance and Sexual Activity  Alcohol Use Not Currently   Additional information: Last pap hysterectomy.   Past Medical History:  Diagnosis Date  . Angio-edema   . COPD (chronic obstructive pulmonary disease) (HCC)    denies SOB with daily activities  . Full dentures   . GERD (gastroesophageal reflux disease)   . History of pancreatitis   . Hypothyroidism   . Nasal sinus congestion 05/08/2014   with sinus drainage  . PVC (premature ventricular contraction) 07/07/2017  .  Stress incontinence   . Symptomatic cholelithiasis 05/2014  . Urticaria   . Varicose veins of bilateral lower extremities with pain   . Wears dentures    upper and lower  . Wears glasses      Family History  Problem Relation Age of Onset  . Hypertension Mother   . Atrial fibrillation Mother   . Heart attack Mother   . Hypertension Sister   . Hypertension Brother   . Hypertension Daughter   . Lung cancer Brother   . Diabetes Sister      Current Outpatient Medications:  .  acetaminophen (TYLENOL) 500 MG tablet, Take 1,000 mg by mouth every 6 (six) hours as needed for moderate pain or headache., Disp: , Rfl:  .  buPROPion (WELLBUTRIN XL) 150 MG 24 hr tablet, TAKE 1 TABLET BY MOUTH EVERY DAY, Disp: 90 tablet, Rfl: 1 .  diphenhydrAMINE (BENADRYL) 25 MG tablet, Take 25-50 mg by mouth daily as needed for itching (allergic reaction). , Disp: , Rfl:  .  famotidine (PEPCID) 10 MG tablet, Take 10 mg by mouth daily., Disp: , Rfl:  .  ibuprofen (ADVIL) 200 MG tablet, Take 400-800 mg by mouth every 6 (six) hours as needed for headache or moderate pain., Disp: , Rfl:  .  levocetirizine (XYZAL) 5 MG tablet, Take 5 mg by mouth at bedtime., Disp: , Rfl:  .  levothyroxine (SYNTHROID) 175 MCG tablet, Take 1 tablet (175 mcg total) by mouth daily., Disp: 90 tablet, Rfl: 1 .  Polyethyl Glycol-Propyl Glycol (SYSTANE ULTRA) 0.4-0.3 % SOLN, Place 1 drop into both eyes 5 (five) times daily as  needed (dry eyes/irriation)., Disp: , Rfl:  .  Vitamin D, Ergocalciferol, (DRISDOL) 1.25 MG (50000 UT) CAPS capsule, Take 1 capsule (50,000 Units total) by mouth every 7 (seven) days., Disp: 24 capsule, Rfl: 0   Allergies  Allergen Reactions  . Fish Allergy Hives  . Gadolinium Derivatives Hives, Itching and Rash    Pt stated she felt itching and burning in her throat immediately following the injection.  . Multihance [Gadobenate] Hives, Itching and Rash    Itching and burning in her throat. Hives and itching of her  skin.  . Omnipaque [Iohexol] Hives  . Other Nausea And Vomiting    ALL NARCOTICS  . Peanut-Containing Drug Products Hives  . Shellfish Allergy Hives  . Codeine Nausea And Vomiting  . Oxycodone Nausea And Vomiting  . Tramadol     headaches     Review of Systems  Constitutional: Negative.   HENT: Negative.   Eyes: Negative.   Respiratory: Negative.  Negative for cough.   Cardiovascular: Negative.   Gastrointestinal: Negative.   Endocrine: Negative.   Genitourinary: Negative.   Musculoskeletal: Negative.   Skin: Negative.   Allergic/Immunologic: Negative.   Neurological: Negative.   Hematological: Negative.   Psychiatric/Behavioral: Negative.      Today's Vitals   12/04/19 1016  BP: 128/80  Pulse: 87  Temp: 98.3 F (36.8 C)  TempSrc: Oral  Weight: 181 lb 12.8 oz (82.5 kg)  Height: 5' 6.2" (1.681 m)   Body mass index is 29.17 kg/m.   Objective:  Physical Exam Constitutional:      General: She is not in acute distress.    Appearance: Normal appearance. She is well-developed.  HENT:     Head: Normocephalic and atraumatic.     Right Ear: Hearing, tympanic membrane, ear canal and external ear normal. There is no impacted cerumen.     Left Ear: Hearing, tympanic membrane, ear canal and external ear normal. There is no impacted cerumen.     Nose:     Comments: Deferred - masked    Mouth/Throat:     Comments: Deferred - masked Eyes:     General: Lids are normal.     Extraocular Movements: Extraocular movements intact.     Conjunctiva/sclera: Conjunctivae normal.     Pupils: Pupils are equal, round, and reactive to light.     Funduscopic exam:    Right eye: No papilledema.        Left eye: No papilledema.  Neck:     Thyroid: No thyroid mass.     Vascular: No carotid bruit.  Cardiovascular:     Rate and Rhythm: Normal rate and regular rhythm.     Pulses: Normal pulses.     Heart sounds: Normal heart sounds. No murmur.  Pulmonary:     Effort: Pulmonary effort  is normal.     Breath sounds: Normal breath sounds.  Abdominal:     General: Abdomen is flat. Bowel sounds are normal.     Palpations: Abdomen is soft.  Musculoskeletal:        General: No swelling. Normal range of motion.     Cervical back: Full passive range of motion without pain, normal range of motion and neck supple.     Right lower leg: No edema.     Left lower leg: No edema.  Skin:    General: Skin is warm and dry.     Capillary Refill: Capillary refill takes less than 2 seconds.  Neurological:     General:  No focal deficit present.     Mental Status: She is alert and oriented to person, place, and time.     Cranial Nerves: No cranial nerve deficit.     Sensory: No sensory deficit.  Psychiatric:        Mood and Affect: Mood normal.        Behavior: Behavior normal.        Thought Content: Thought content normal.        Judgment: Judgment normal.         Assessment And Plan:     1. Routine general medical examination at a health care facility . Behavior modifications discussed and diet history reviewed.   . Pt will continue to exercise regularly and modify diet with low GI, plant based foods and decrease intake of processed foods.  . Recommend intake of daily multivitamin, Vitamin D, and calcium.  . Recommend mammogram and colonoscopy for preventive screenings, as well as recommend immunizations that include influenza, TDAP (up to date)  2. Vitamin B12 deficiency  Monthly vitamin B12 given in office. - Vitamin B12 - cyanocobalamin ((VITAMIN B-12)) injection 1,000 mcg  3. Acquired hypothyroidism  Chronic, controlled  Continue with current medications - TSH  4. Vitamin D deficiency  Will check vitamin D level and supplement as needed.     Also encouraged to spend 15 minutes in the sun daily.  - VITAMIN D 25 Hydroxy (Vit-D Deficiency, Fractures)  5. Foot pain, bilateral  She is to ensure she has good fitting shoes   6. Hyperlipidemia, unspecified  hyperlipidemia type  Chronic, diet controlled - Comprehensive metabolic panel - Lipid panel  7. Tobacco abuse  Smoking cessation instruction/counseling given:  counseled patient on the dangers of tobacco use, advised patient to stop smoking, and reviewed strategies to maximize success    [name], FNP    THE PATIENT IS ENCOURAGED TO PRACTICE SOCIAL DISTANCING DUE TO THE COVID-19 PANDEMIC.

## 2019-12-05 ENCOUNTER — Other Ambulatory Visit: Payer: Self-pay | Admitting: Nurse Practitioner

## 2019-12-05 DIAGNOSIS — E559 Vitamin D deficiency, unspecified: Secondary | ICD-10-CM

## 2019-12-05 LAB — COMPREHENSIVE METABOLIC PANEL
ALT: 9 IU/L (ref 0–32)
AST: 13 IU/L (ref 0–40)
Albumin/Globulin Ratio: 1.2 (ref 1.2–2.2)
Albumin: 4 g/dL (ref 3.8–4.9)
Alkaline Phosphatase: 95 IU/L (ref 39–117)
BUN/Creatinine Ratio: 15 (ref 9–23)
BUN: 8 mg/dL (ref 6–24)
Bilirubin Total: 0.2 mg/dL (ref 0.0–1.2)
CO2: 27 mmol/L (ref 20–29)
Calcium: 9.3 mg/dL (ref 8.7–10.2)
Chloride: 101 mmol/L (ref 96–106)
Creatinine, Ser: 0.53 mg/dL — ABNORMAL LOW (ref 0.57–1.00)
GFR calc Af Amer: 124 mL/min/{1.73_m2} (ref >59)
GFR calc non Af Amer: 107 mL/min/{1.73_m2} (ref >59)
Globulin, Total: 3.3 g/dL (ref 1.5–4.5)
Glucose: 83 mg/dL (ref 65–99)
Potassium: 4.4 mmol/L (ref 3.5–5.2)
Sodium: 137 mmol/L (ref 134–144)
Total Protein: 7.3 g/dL (ref 6.0–8.5)

## 2019-12-05 LAB — TSH: TSH: 1.5 u[IU]/mL (ref 0.450–4.500)

## 2019-12-05 LAB — LIPID PANEL
Chol/HDL Ratio: 2.9 ratio (ref 0.0–4.4)
Cholesterol, Total: 193 mg/dL (ref 100–199)
HDL: 67 mg/dL (ref >39)
LDL Chol Calc (NIH): 115 mg/dL — ABNORMAL HIGH (ref 0–99)
Triglycerides: 61 mg/dL (ref 0–149)
VLDL Cholesterol Cal: 11 mg/dL (ref 5–40)

## 2019-12-05 LAB — VITAMIN B12: Vitamin B-12: 542 pg/mL (ref 232–1245)

## 2019-12-05 LAB — VITAMIN D 25 HYDROXY (VIT D DEFICIENCY, FRACTURES): Vit D, 25-Hydroxy: 18.3 ng/mL — ABNORMAL LOW (ref 30.0–100.0)

## 2019-12-05 MED ORDER — VITAMIN D (ERGOCALCIFEROL) 1.25 MG (50000 UNIT) PO CAPS: 50000.0000 [IU] | ORAL_CAPSULE | ORAL | 0 refills | Status: DC

## 2019-12-25 ENCOUNTER — Encounter: Payer: Self-pay | Admitting: Nurse Practitioner

## 2020-02-17 ENCOUNTER — Other Ambulatory Visit: Payer: Self-pay | Admitting: Nurse Practitioner

## 2020-04-02 ENCOUNTER — Other Ambulatory Visit: Payer: Self-pay

## 2020-04-02 ENCOUNTER — Encounter: Payer: Self-pay | Admitting: Nurse Practitioner

## 2020-04-02 ENCOUNTER — Ambulatory Visit: Payer: BC Managed Care – PPO | Admitting: Nurse Practitioner

## 2020-04-02 VITALS — BP 126/86 | HR 91 | Temp 98.0°F | Ht 66.2 in | Wt 186.0 lb

## 2020-04-02 DIAGNOSIS — Z139 Encounter for screening, unspecified: Secondary | ICD-10-CM | POA: Diagnosis not present

## 2020-04-02 DIAGNOSIS — E039 Hypothyroidism, unspecified: Secondary | ICD-10-CM

## 2020-04-02 DIAGNOSIS — Z720 Tobacco use: Secondary | ICD-10-CM | POA: Diagnosis not present

## 2020-04-02 DIAGNOSIS — E538 Deficiency of other specified B group vitamins: Secondary | ICD-10-CM

## 2020-04-02 DIAGNOSIS — E785 Hyperlipidemia, unspecified: Secondary | ICD-10-CM | POA: Diagnosis not present

## 2020-04-02 DIAGNOSIS — E559 Vitamin D deficiency, unspecified: Secondary | ICD-10-CM | POA: Diagnosis not present

## 2020-04-02 MED ORDER — CYANOCOBALAMIN 1000 MCG/ML IJ SOLN
1000.0000 ug | Freq: Once | INTRAMUSCULAR | Status: AC
  Administered 2020-04-02: 1000 ug via INTRAMUSCULAR

## 2020-04-02 NOTE — Progress Notes (Signed)
I,Yamilka Roman [employer] as a Neurosurgeon for [employer], FNP.,have documented all relevant documentation on the behalf of Arnette Felts, FNP,as directed by  Arnette Felts, FNP while in the presence of Arnette Felts, FNP. This visit occurred during the SARS-CoV-2 public health emergency.  Safety protocols were in place, including screening questions prior to the visit, additional usage of staff PPE, and extensive cleaning of exam room while observing appropriate contact time as indicated for disinfecting solutions.  Subjective:     Patient ID: Dana Martin , female    DOB: [birth year] , 54 y.o.   MRN: [id number]   Chief Complaint  Patient presents with  . 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  She is interested in getting the second vaccine. She is now smoking 3 cigarettes per day, she had taken the wellbutrin but had raw mouth and stopped.   Thyroid Problem Presents for initial visit. Patient reports no anxiety, depressed mood, fatigue or palpitations. Past treatments include nothing. The treatment provided no relief. There are no known risk factors.     Past Medical History:  Diagnosis Date  . Angio-edema   . COPD (chronic obstructive pulmonary disease) (HCC)    denies SOB with daily activities  . Full dentures   . GERD (gastroesophageal reflux disease)   . History of pancreatitis   . Hypothyroidism   . Nasal sinus congestion 05/08/2014   with sinus drainage  . PVC (premature ventricular contraction) 07/07/2017  . Stress incontinence   . Symptomatic cholelithiasis 05/2014  . Urticaria   . Varicose veins of bilateral lower extremities with pain   . Wears dentures    upper and lower  . Wears glasses      Family History  Problem Relation Age of Onset  . Hypertension Mother   . Atrial fibrillation Mother   . Heart attack  Mother   . Hypertension Sister   . Hypertension Brother   . Hypertension Daughter   . Lung cancer Brother   . Diabetes Sister      Current Outpatient Medications:  .  acetaminophen (TYLENOL) 500 MG tablet, Take 1,000 mg by mouth every 6 (six) hours as needed for moderate pain or headache., Disp: , Rfl:  .  diphenhydrAMINE (BENADRYL) 25 MG tablet, Take 25-50 mg by mouth daily as needed for itching (allergic reaction). , Disp: , Rfl:  .  famotidine (PEPCID) 10 MG tablet, Take 10 mg by mouth daily., Disp: , Rfl:  .  ibuprofen (ADVIL) 200 MG tablet, Take 400-800 mg by mouth every 6 (six) hours as needed for headache or moderate pain., Disp: , Rfl:  .  levocetirizine (XYZAL) 5 MG tablet, Take 5 mg by mouth at bedtime., Disp: , Rfl:  .  Polyethyl Glycol-Propyl Glycol (SYSTANE ULTRA) 0.4-0.3 % SOLN, Place 1 drop into both eyes 5 (five) times daily as needed (dry eyes/irriation)., Disp: , Rfl:  .  SYNTHROID 175 MCG tablet, TAKE 1 TABLET BY MOUTH EVERY DAY, Disp: 90 tablet, Rfl: 1 .  Vitamin D, Ergocalciferol, (DRISDOL) 1.25 MG (50000 UNIT) CAPS capsule, Take 1 capsule (50,000 Units total) by mouth every 7 (seven) days., Disp: 24 capsule, Rfl: 0 .  buPROPion (WELLBUTRIN XL) 150 MG 24 hr tablet, TAKE 1 TABLET BY MOUTH EVERY DAY (Patient not taking: Reported on 04/02/2020), Disp: 90  tablet, Rfl: 1   Allergies  Allergen Reactions  . Fish Allergy Hives  . Gadolinium Derivatives Hives, Itching and Rash    Pt stated she felt itching and burning in her throat immediately following the injection.  . Multihance [Gadobenate] Hives, Itching and Rash    Itching and burning in her throat. Hives and itching of her skin.  . Omnipaque [Iohexol] Hives  . Other Nausea And Vomiting    ALL NARCOTICS  . Peanut-Containing Drug Products Hives  . Shellfish Allergy Hives  . Codeine Nausea And Vomiting  . Oxycodone Nausea And Vomiting  . Tramadol     headaches     Review of Systems  Constitutional: Negative for  fatigue.  Respiratory: Negative.  Negative for cough.   Cardiovascular: Negative for chest pain, palpitations and leg swelling.  Neurological: Negative for dizziness and headaches.  Psychiatric/Behavioral: Negative for agitation. The patient is not nervous/anxious.       Today's Vitals   04/02/20 1511  BP: 126/86  Pulse: 91  Temp: 98 F (36.7 C)  TempSrc: Oral  Weight: 186 lb (84.4 kg)  Height: 5' 6.2" (1.681 m)  PainSc: 0-No pain   Body mass index is 29.84 kg/m.   Objective:  Physical Exam Constitutional:      General: She is not in acute distress.    Appearance: Normal appearance.  Cardiovascular:     Rate and Rhythm: Normal rate and regular rhythm.     Pulses: Normal pulses.     Heart sounds: Normal heart sounds. No murmur heard.   Pulmonary:     Effort: Pulmonary effort is normal. No respiratory distress.     Breath sounds: Normal breath sounds. No wheezing.  Neurological:     General: No focal deficit present.     Mental Status: She is alert and oriented to person, place, and time.     Cranial Nerves: No cranial nerve deficit.  Psychiatric:        Mood and Affect: Mood normal.        Behavior: Behavior normal.        Thought Content: Thought content normal.        Judgment: Judgment normal.         Assessment And Plan:     1. Tobacco abuse  She has not been taking the wellbutrin due to her feeling she had a side effect with a "raw" mouth  She is trying to use nicotine patches.   2. Acquired hypothyroidism  Chronic, controlled  Continue with current medications - TSH - T4 - T3, free  3. Vitamin B12 deficiency  Vitamin b12 injection given today in office and will check levels - Vitamin B12 - cyanocobalamin ((VITAMIN B-12)) injection 1,000 mcg  4. Hyperlipidemia, unspecified hyperlipidemia type  Chronic, controlled  Continue with current medications  5. Vitamin D deficiency  Will check vitamin D level and supplement as needed.     Also  encouraged to spend 15 minutes in the sun daily.  - VITAMIN D 25 Hydroxy (Vit-D Deficiency, Fractures)  6. Encounter for screening  She would like to see what her antibody level is - SARS-CoV-2 Semi-Quantitative Total Antibody, Spike     Patient was given opportunity to ask questions. Patient verbalized understanding of the plan and was able to repeat key elements of the plan. All questions were answered to their satisfaction.  Arnette Felts, FNP   I, Arnette Felts, FNP, have reviewed all documentation for this visit. The documentation on 05/03/20 for  the exam, diagnosis, procedures, and orders are all accurate and complete.  THE PATIENT IS ENCOURAGED TO PRACTICE SOCIAL DISTANCING DUE TO THE COVID-19 PANDEMIC.

## 2020-04-03 LAB — VITAMIN B12: Vitamin B-12: 410 pg/mL (ref 232–1245)

## 2020-04-03 LAB — T4: T4, Total: 9.3 ug/dL (ref 4.5–12.0)

## 2020-04-03 LAB — SARS-COV-2 SEMI-QUANTITATIVE TOTAL ANTIBODY, SPIKE
SARS-CoV-2 Semi-Quant Total Ab: 50.6 U/mL (ref <0.8)
SARS-CoV-2 Spike Ab Interp: POSITIVE

## 2020-04-03 LAB — T3, FREE: T3, Free: 2.6 pg/mL (ref 2.0–4.4)

## 2020-04-03 LAB — VITAMIN D 25 HYDROXY (VIT D DEFICIENCY, FRACTURES): Vit D, 25-Hydroxy: 21.9 ng/mL — ABNORMAL LOW (ref 30.0–100.0)

## 2020-04-03 LAB — TSH: TSH: 1.42 u[IU]/mL (ref 0.450–4.500)

## 2020-05-03 ENCOUNTER — Encounter: Payer: Self-pay | Admitting: Nurse Practitioner

## 2020-06-10 DIAGNOSIS — M79671 Pain in right foot: Secondary | ICD-10-CM | POA: Diagnosis not present

## 2020-08-06 ENCOUNTER — Encounter: Payer: Self-pay | Admitting: Nurse Practitioner

## 2020-08-06 ENCOUNTER — Ambulatory Visit: Payer: BC Managed Care – PPO | Admitting: Nurse Practitioner

## 2020-08-07 DIAGNOSIS — Z03818 Encounter for observation for suspected exposure to other biological agents ruled out: Secondary | ICD-10-CM | POA: Diagnosis not present

## 2020-08-13 ENCOUNTER — Other Ambulatory Visit: Payer: Self-pay | Admitting: Nurse Practitioner

## 2020-08-19 ENCOUNTER — Other Ambulatory Visit: Payer: Self-pay

## 2020-08-19 ENCOUNTER — Encounter: Payer: Self-pay | Admitting: Nurse Practitioner

## 2020-08-19 ENCOUNTER — Ambulatory Visit: Payer: BC Managed Care – PPO | Admitting: Nurse Practitioner

## 2020-08-19 VITALS — BP 118/80 | HR 77 | Temp 98.2°F | Ht 66.2 in | Wt 191.8 lb

## 2020-08-19 DIAGNOSIS — Z2821 Immunization not carried out because of patient refusal: Secondary | ICD-10-CM

## 2020-08-19 DIAGNOSIS — E559 Vitamin D deficiency, unspecified: Secondary | ICD-10-CM | POA: Diagnosis not present

## 2020-08-19 DIAGNOSIS — M79671 Pain in right foot: Secondary | ICD-10-CM | POA: Diagnosis not present

## 2020-08-19 DIAGNOSIS — Z720 Tobacco use: Secondary | ICD-10-CM

## 2020-08-19 DIAGNOSIS — E785 Hyperlipidemia, unspecified: Secondary | ICD-10-CM | POA: Diagnosis not present

## 2020-08-19 DIAGNOSIS — E039 Hypothyroidism, unspecified: Secondary | ICD-10-CM

## 2020-08-19 DIAGNOSIS — E782 Mixed hyperlipidemia: Secondary | ICD-10-CM | POA: Diagnosis not present

## 2020-08-19 DIAGNOSIS — Z1211 Encounter for screening for malignant neoplasm of colon: Secondary | ICD-10-CM

## 2020-08-19 MED ORDER — BUPROPION HCL ER (SR) 100 MG PO TB12: 100.0000 mg | ORAL_TABLET | Freq: Every day | ORAL | 1 refills | Status: DC

## 2020-08-19 NOTE — Progress Notes (Signed)
I,Yamilka Roman [employer] as a Neurosurgeon for [employer], FNP.,have documented all relevant documentation on the behalf of Arnette Felts, FNP,as directed by  Arnette Felts, FNP while in the presence of Arnette Felts, FNP. This visit occurred during the SARS-CoV-2 public health emergency.  Safety protocols were in place, including screening questions prior to the visit, additional usage of staff PPE, and extensive cleaning of exam room while observing appropriate contact time as indicated for disinfecting solutions.  Subjective:     Patient ID: Dana Martin , female    DOB: 1966/07/27 , 55 y.o.   MRN: [id number]   Chief Complaint  Patient presents with  . Hypothyroidism    HPI  Patient presents today for a f/u on her thyroid and cholesterol. She is tolerating medications well   Thyroid Problem Presents for follow-up visit. Patient reports no anxiety, fatigue or palpitations. The symptoms have been stable.     Past Medical History:  Diagnosis Date  . Angio-edema   . COPD (chronic obstructive pulmonary disease) (HCC)    denies SOB with daily activities  . Full dentures   . GERD (gastroesophageal reflux disease)   . History of pancreatitis   . Hypothyroidism   . Nasal sinus congestion 05/08/2014   with sinus drainage  . PVC (premature ventricular contraction) 07/07/2017  . Stress incontinence   . Symptomatic cholelithiasis 05/2014  . Urticaria   . Varicose veins of bilateral lower extremities with pain   . Wears dentures    upper and lower  . Wears glasses      Family History  Problem Relation Age of Onset  . Hypertension Mother   . Atrial fibrillation Mother   . Heart attack Mother   . Hypertension Sister   . Hypertension Brother   . Hypertension Daughter   . Lung cancer Brother   . Diabetes Sister      Current Outpatient Medications:  .  acetaminophen (TYLENOL) 500 MG tablet, Take 1,000 mg by mouth every 6 (six) hours as needed for moderate pain or headache.,  Disp: , Rfl:  .  buPROPion (WELLBUTRIN SR) 100 MG 12 hr tablet, Take 1 tablet (100 mg total) by mouth daily., Disp: 90 tablet, Rfl: 1 .  diphenhydrAMINE (BENADRYL) 25 MG tablet, Take 25-50 mg by mouth daily as needed for itching (allergic reaction). , Disp: , Rfl:  .  famotidine (PEPCID) 10 MG tablet, Take 10 mg by mouth daily., Disp: , Rfl:  .  ibuprofen (ADVIL) 200 MG tablet, Take 400-800 mg by mouth every 6 (six) hours as needed for headache or moderate pain., Disp: , Rfl:  .  levocetirizine (XYZAL) 5 MG tablet, Take 5 mg by mouth at bedtime., Disp: , Rfl:  .  Polyethyl Glycol-Propyl Glycol 0.4-0.3 % SOLN, Place 1 drop into both eyes 5 (five) times daily as needed (dry eyes/irriation)., Disp: , Rfl:  .  SYNTHROID 175 MCG tablet, TAKE 1 TABLET BY MOUTH EVERY DAY, Disp: 90 tablet, Rfl: 1 .  Vitamin D, Ergocalciferol, (DRISDOL) 1.25 MG (50000 UNIT) CAPS capsule, Take 1 capsule (50,000 Units total) by mouth every 7 (seven) days., Disp: 24 capsule, Rfl: 0   Allergies  Allergen Reactions  . Fish Allergy Hives  . Gadolinium Derivatives Hives, Itching and Rash    Pt stated she felt itching and burning in her throat immediately following the injection.  . Multihance [Gadobenate] Hives, Itching and Rash    Itching and burning in her throat. Hives and itching of her skin.  Marland Kitchen  Omnipaque [Iohexol] Hives  . Other Nausea And Vomiting    ALL NARCOTICS  . Peanut-Containing Drug Products Hives  . Shellfish Allergy Hives  . Codeine Nausea And Vomiting  . Oxycodone Nausea And Vomiting  . Tramadol     headaches     Review of Systems  Constitutional: Negative.  Negative for fatigue.  Respiratory: Negative.  Negative for cough.   Cardiovascular: Negative.  Negative for chest pain, palpitations and leg swelling.  Musculoskeletal:       Right foot pain for 6 months, she has purchased new shoes without relief.   Neurological: Negative for dizziness and headaches.  Psychiatric/Behavioral: Negative for  agitation. The patient is not nervous/anxious.      Today's Vitals   08/19/20 1117  BP: 118/80  Pulse: 77  Temp: 98.2 F (36.8 C)  TempSrc: Oral  Weight: 191 lb 12.8 oz (87 kg)  Height: 5' 6.2" (1.681 m)  PainSc: 0-No pain   Body mass index is 30.77 kg/m.   Objective:  Physical Exam Constitutional:      General: She is not in acute distress.    Appearance: Normal appearance.  Cardiovascular:     Rate and Rhythm: Normal rate and regular rhythm.     Pulses: Normal pulses.     Heart sounds: Normal heart sounds. No murmur heard.   Pulmonary:     Effort: Pulmonary effort is normal. No respiratory distress.     Breath sounds: Normal breath sounds. No wheezing.  Musculoskeletal:        General: Tenderness (underneath right malleolus ) present.     Comments: Right lateral malleous tenderness  Neurological:     General: No focal deficit present.     Mental Status: She is alert and oriented to person, place, and time.     Cranial Nerves: No cranial nerve deficit.     Motor: No weakness.  Psychiatric:        Mood and Affect: Mood normal.        Behavior: Behavior normal.        Thought Content: Thought content normal.        Judgment: Judgment normal.         Assessment And Plan:     1. Acquired hypothyroidism  Chronic, controlled  Continue with current medications - TSH - T3, free - T4  2. Hyperlipidemia, unspecified hyperlipidemia type  Chronic, controlled  No current medications, diet controlled - Lipid panel  3. Influenza vaccination declined  Patient declined influenza vaccination at this time. Patient is aware that influenza vaccine prevents illness in 70% of healthy people, and reduces hospitalizations to 30-70% in elderly. This vaccine is recommended annually. Pt is willing to accept risk associated with refusing vaccination.  4. Vitamin D deficiency  Will check vitamin D level and supplement as needed.     Also encouraged to spend 15 minutes in  the sun daily.  - VITAMIN D 25 Hydroxy (Vit-D Deficiency, Fractures)  5. Tobacco abuse  She would like to try to decrease the dose of wellbutrin due to dry mouth and she is unable to cut the pills in half - buPROPion (WELLBUTRIN SR) 100 MG 12 hr tablet; Take 1 tablet (100 mg total) by mouth daily.  Dispense: 90 tablet; Refill: 1  6. Encounter for screening colonoscopy  According to USPTF Colorectal cancer Screening guidelines. Colonoscopy is recommended every 10 years, starting at age 40 years.  Will do cologuard  She is aware if this is positive she  will need a colonoscopy - Cologuard     Patient was given opportunity to ask questions. Patient verbalized understanding of the plan and was able to repeat key elements of the plan. All questions were answered to their satisfaction.  Arnette Felts, FNP    I, Arnette Felts, FNP, have reviewed all documentation for this visit. The documentation on 08/19/20 for the exam, diagnosis, procedures, and orders are all accurate and complete.   THE PATIENT IS ENCOURAGED TO PRACTICE SOCIAL DISTANCING DUE TO THE COVID-19 PANDEMIC.

## 2020-08-19 NOTE — Patient Instructions (Signed)
Hypothyroidism  Hypothyroidism is when the thyroid gland does not make enough of certain hormones (it is underactive). The thyroid gland is a small gland located in the lower front part of the neck, just in front of the windpipe (trachea). This gland makes hormones that help control how the body uses food for energy (metabolism) as well as how the heart and brain function. These hormones also play a role in keeping your bones strong. When the thyroid is underactive, it produces too little of the hormones thyroxine (T4) and triiodothyronine (T3). What are the causes? This condition may be caused by:  Hashimoto's disease. This is a disease in which the body's disease-fighting system (immune system) attacks the thyroid gland. This is the most common cause.  Viral infections.  Pregnancy.  Certain medicines.  Birth defects.  Past radiation treatments to the head or neck for cancer.  Past treatment with radioactive iodine.  Past exposure to radiation in the environment.  Past surgical removal of part or all of the thyroid.  Problems with a gland in the center of the brain (pituitary gland).  Lack of enough iodine in the diet. What increases the risk? You are more likely to develop this condition if:  You are female.  You have a family history of thyroid conditions.  You use a medicine called lithium.  You take medicines that affect the immune system (immunosuppressants). What are the signs or symptoms? Symptoms of this condition include:  Feeling as though you have no energy (lethargy).  Not being able to tolerate cold.  Weight gain that is not explained by a change in diet or exercise habits.  Lack of appetite.  Dry skin.  Coarse hair.  Menstrual irregularity.  Slowing of thought processes.  Constipation.  Sadness or depression. How is this diagnosed? This condition may be diagnosed based on:  Your symptoms, your medical history, and a physical exam.  Blood  tests. You may also have imaging tests, such as an ultrasound or MRI. How is this treated? This condition is treated with medicine that replaces the thyroid hormones that your body does not make. After you begin treatment, it may take several weeks for symptoms to go away. Follow these instructions at home:  Take over-the-counter and prescription medicines only as told by your health care provider.  If you start taking any new medicines, tell your health care provider.  Keep all follow-up visits as told by your health care provider. This is important. ? As your condition improves, your dosage of thyroid hormone medicine may change. ? You will need to have blood tests regularly so that your health care provider can monitor your condition. Contact a health care provider if:  Your symptoms do not get better with treatment.  You are taking thyroid hormone replacement medicine and you: ? Sweat a lot. ? Have tremors. ? Feel anxious. ? Lose weight rapidly. ? Cannot tolerate heat. ? Have emotional swings. ? Have diarrhea. ? Feel weak. Get help right away if you have:  Chest pain.  An irregular heartbeat.  A rapid heartbeat.  Difficulty breathing. Summary  Hypothyroidism is when the thyroid gland does not make enough of certain hormones (it is underactive).  When the thyroid is underactive, it produces too little of the hormones thyroxine (T4) and triiodothyronine (T3).  The most common cause is Hashimoto's disease, a disease in which the body's disease-fighting system (immune system) attacks the thyroid gland. The condition can also be caused by viral infections, medicine, pregnancy, or   past radiation treatment to the head or neck.  Symptoms may include weight gain, dry skin, constipation, feeling as though you do not have energy, and not being able to tolerate cold.  This condition is treated with medicine to replace the thyroid hormones that your body does not make. This  information is not intended to replace advice given to you by your health care provider. Make sure you discuss any questions you have with your health care provider. Document Revised: 04/24/2020 Document Reviewed: 04/09/2020 Elsevier Patient Education  2021 Elsevier Inc.  

## 2020-08-20 LAB — T4: T4, Total: 10.4 ug/dL (ref 4.5–12.0)

## 2020-08-20 LAB — T3, FREE: T3, Free: 2.4 pg/mL (ref 2.0–4.4)

## 2020-08-20 LAB — VITAMIN D 25 HYDROXY (VIT D DEFICIENCY, FRACTURES): Vit D, 25-Hydroxy: 15.6 ng/mL — ABNORMAL LOW (ref 30.0–100.0)

## 2020-08-20 LAB — TSH: TSH: 2.99 u[IU]/mL (ref 0.450–4.500)

## 2020-08-25 ENCOUNTER — Other Ambulatory Visit: Payer: Self-pay | Admitting: Nurse Practitioner

## 2020-08-25 DIAGNOSIS — E559 Vitamin D deficiency, unspecified: Secondary | ICD-10-CM

## 2020-08-25 MED ORDER — VITAMIN D (ERGOCALCIFEROL) 1.25 MG (50000 UNIT) PO CAPS: 50000.0000 [IU] | ORAL_CAPSULE | ORAL | 1 refills | Status: DC

## 2020-08-27 ENCOUNTER — Other Ambulatory Visit: Payer: Self-pay | Admitting: Nurse Practitioner

## 2020-08-27 DIAGNOSIS — M79671 Pain in right foot: Secondary | ICD-10-CM

## 2020-08-28 LAB — LIPID PANEL
Chol/HDL Ratio: 3.6 ratio (ref 0.0–4.4)
Cholesterol, Total: 226 mg/dL — ABNORMAL HIGH (ref 100–199)
HDL: 63 mg/dL (ref >39)
LDL Chol Calc (NIH): 145 mg/dL — ABNORMAL HIGH (ref 0–99)
Triglycerides: 100 mg/dL (ref 0–149)
VLDL Cholesterol Cal: 18 mg/dL (ref 5–40)

## 2020-08-28 LAB — SPECIMEN STATUS REPORT

## 2020-09-07 ENCOUNTER — Ambulatory Visit: Payer: BC Managed Care – PPO | Admitting: Allergy

## 2020-09-09 ENCOUNTER — Ambulatory Visit: Payer: BC Managed Care – PPO | Admitting: Podiatry

## 2020-09-09 DIAGNOSIS — R197 Diarrhea, unspecified: Secondary | ICD-10-CM | POA: Insufficient documentation

## 2020-09-09 DIAGNOSIS — R143 Flatulence: Secondary | ICD-10-CM | POA: Insufficient documentation

## 2020-09-09 DIAGNOSIS — R1084 Generalized abdominal pain: Secondary | ICD-10-CM | POA: Insufficient documentation

## 2020-09-09 DIAGNOSIS — R141 Gas pain: Secondary | ICD-10-CM | POA: Insufficient documentation

## 2020-09-10 ENCOUNTER — Ambulatory Visit: Payer: BC Managed Care – PPO | Admitting: Podiatry

## 2020-09-10 ENCOUNTER — Other Ambulatory Visit: Payer: Self-pay

## 2020-09-10 ENCOUNTER — Encounter: Payer: Self-pay | Admitting: Podiatry

## 2020-09-10 DIAGNOSIS — Q667 Congenital pes cavus, unspecified foot: Secondary | ICD-10-CM

## 2020-09-10 DIAGNOSIS — M7671 Peroneal tendinitis, right leg: Secondary | ICD-10-CM

## 2020-09-10 NOTE — Progress Notes (Signed)
Subjective:  Patient ID: Dana Martin, female    DOB: 01/24/1966,  MRN: [id number]  Chief Complaint  Patient presents with  . Foot Pain    Patient presents today for right foot pain lateral side and heel x 1 year, progressively getting worse.  She says its worse with walking and has burning, shooting pains and it aches in the evenings    55 y.o. female presents with the above complaint. Patient presents with complaint of right lateral foot pain that has been going on for past year. [facility] which were negative for any kind of osseous abnormalities. She brought a copy of one of her x-ray to be evaluated. She states that she works on a concrete surface for long time at [employer]. She states that this has been progressive gotten worse has not gotten better. She denies any other acute complaints. She would like to discuss treatment options.   Review of Systems: Negative except as noted in the HPI. Denies N/V/F/Ch.  Past Medical History:  Diagnosis Date  . Angio-edema   . COPD (chronic obstructive pulmonary disease) (HCC)    denies SOB with daily activities  . Full dentures   . GERD (gastroesophageal reflux disease)   . History of pancreatitis   . Hypothyroidism   . Nasal sinus congestion 05/08/2014   with sinus drainage  . PVC (premature ventricular contraction) 07/07/2017  . Stress incontinence   . Symptomatic cholelithiasis 05/2014  . Urticaria   . Varicose veins of bilateral lower extremities with pain   . Wears dentures    upper and lower  . Wears glasses     Current Outpatient Medications:  .  buPROPion (WELLBUTRIN SR) 100 MG 12 hr tablet, Take 1 tablet (100 mg total) by mouth daily., Disp: 90 tablet, Rfl: 1 .  diphenhydrAMINE (BENADRYL) 25 MG tablet, Take 25-50 mg by mouth daily as needed for itching (allergic reaction). , Disp: , Rfl:  .  famotidine (PEPCID) 10 MG tablet, Take 10 mg by mouth daily., Disp: , Rfl:  .  ibuprofen (ADVIL) 200  MG tablet, Take 400-800 mg by mouth every 6 (six) hours as needed for headache or moderate pain., Disp: , Rfl:  .  levocetirizine (XYZAL) 5 MG tablet, Take 5 mg by mouth at bedtime., Disp: , Rfl:  .  Polyethyl Glycol-Propyl Glycol 0.4-0.3 % SOLN, Place 1 drop into both eyes 5 (five) times daily as needed (dry eyes/irriation)., Disp: , Rfl:  .  SYNTHROID 175 MCG tablet, TAKE 1 TABLET BY MOUTH EVERY DAY, Disp: 90 tablet, Rfl: 1 .  Vitamin D, Ergocalciferol, (DRISDOL) 1.25 MG (50000 UNIT) CAPS capsule, Take 1 capsule (50,000 Units total) by mouth 2 (two) times a week., Disp: 24 capsule, Rfl: 1  Social History   Tobacco Use  Smoking Status Current Every Day Smoker  . Packs/day: 0.50  . Types: Cigarettes  Smokeless Tobacco Never Used  Tobacco Comment   quit for six years, started back 09-2018 - currently on wellbutrin for smoking cessation    Allergies  Allergen Reactions  . Fish Allergy Hives  . Gadolinium Derivatives Hives, Itching and Rash    Pt stated she felt itching and burning in her throat immediately following the injection.  . Multihance [Gadobenate] Hives, Itching and Rash    Itching and burning in her throat. Hives and itching of her skin.  . Omnipaque [Iohexol] Hives  . Other Nausea And Vomiting    ALL NARCOTICS  .  Peanut-Containing Drug Products Hives  . Shellfish Allergy Hives  . Codeine Nausea And Vomiting  . Hydrocodone-Acetaminophen Other (See Comments)  . Oxycodone Nausea And Vomiting  . Tramadol     headaches   Objective:  There were no vitals filed for this visit. There is no height or weight on file to calculate BMI. Constitutional Well developed. Well nourished.  Vascular Dorsalis pedis pulses palpable bilaterally. Posterior tibial pulses palpable bilaterally. Capillary refill normal to all digits.  No cyanosis or clubbing noted. Pedal hair growth normal.  Neurologic Normal speech. Oriented to person, place, and time. Epicritic sensation to light touch  grossly present bilaterally.  Dermatologic Nails well groomed and normal in appearance. No open wounds. No skin lesions.  Orthopedic:  Pain on palpation along the course of the peroneal tendon. Mild pain at the insertion of the peroneal tendon. Pain with resisted dorsiflexion eversion of the foot. No pain at the posterior tibial tendon, ATFL ligament. Mild pain at the Achilles tendon. No pain at the ankle joint range of motion. No deep intra-articular ankle joint pain noted.   Radiographs: None. Patient already has x-rays done recently at outside facility Assessment:   1. Peroneal tendinitis, right   2. Pes cavus    Plan:  Patient was evaluated and treated and all questions answered.  Right peroneal tendinitis -I explained the patient the etiology of peroneal tendinitis and various treatment options were extensively discussed. Given that patient also has an underlying pes cavus foot structure is likely putting excessive stress on the peroneal tendon leading to inflammation. Given the amount of pain she is having in the setting of a lot of weightbearing to the foot in a concrete surface I believe patient will benefit from complete immobilization of the right foot and ankle. Should be placed in a cam boot to help address that. She states understanding -Cam boot was dispensed -If there is no improvement we will discuss steroid injection versus possible MRI.  Pes cavus -I explained the patient the etiology of pes cavus and various treatment options were extensively discussed. Given that she has a high arch foot structure putting a lot of stress on the peroneal tendon I believe she will benefit from custom-made orthotics to help control the hindfoot motion support the arch of the foot and take the stress away from the peroneal tendon. -Should be scheduled see rec for custom-made orthotics  No follow-ups on file.

## 2020-09-21 ENCOUNTER — Other Ambulatory Visit: Payer: Self-pay

## 2020-09-21 ENCOUNTER — Ambulatory Visit (INDEPENDENT_AMBULATORY_CARE_PROVIDER_SITE_OTHER): Payer: BC Managed Care – PPO | Admitting: Orthotics

## 2020-09-21 DIAGNOSIS — Q667 Congenital pes cavus, unspecified foot: Secondary | ICD-10-CM

## 2020-09-21 DIAGNOSIS — M7672 Peroneal tendinitis, left leg: Secondary | ICD-10-CM

## 2020-09-21 DIAGNOSIS — M7671 Peroneal tendinitis, right leg: Secondary | ICD-10-CM

## 2020-09-21 NOTE — Progress Notes (Signed)
Patient came in today for evaluation/assessment custom foot orthotics.  Patient presents foot pain and discomfort associated with peroneal tendonitis.  Patient has noted pes cavus foot type with also rear foot varus deformity. Marland KitchenMarland KitchenMarland KitchenGoal is to "bring ground up" with contoured arch support, and 4 degree valgus RF and FF posting.

## 2020-10-08 ENCOUNTER — Encounter: Payer: Self-pay | Admitting: Podiatry

## 2020-10-08 ENCOUNTER — Other Ambulatory Visit: Payer: Self-pay

## 2020-10-08 ENCOUNTER — Ambulatory Visit (INDEPENDENT_AMBULATORY_CARE_PROVIDER_SITE_OTHER): Payer: BC Managed Care – PPO | Admitting: Podiatry

## 2020-10-08 DIAGNOSIS — M7671 Peroneal tendinitis, right leg: Secondary | ICD-10-CM | POA: Diagnosis not present

## 2020-10-08 DIAGNOSIS — S96919A Strain of unspecified muscle and tendon at ankle and foot level, unspecified foot, initial encounter: Secondary | ICD-10-CM

## 2020-10-08 NOTE — Progress Notes (Signed)
Subjective:  Patient ID: Dana Martin, female    DOB: 1965/09/13,  MRN: [id number]  Chief Complaint  Patient presents with  . Foot Pain    "its a little better.  I can't wear the boot all the time and not at work"    55 y.o. female presents with the above complaint.  Patient presents with follow-up of right peroneal tendinitis.  [facility] which was [facility].  She states she is doing a lot better.  She does work at [employer] on a concrete surface.  She states that she was not able to work at all.  She cannot wear the boot at all times.  She would like to discuss further treatment options.   Review of Systems: Negative except as noted in the HPI. Denies N/V/F/Ch.  Past Medical History:  Diagnosis Date  . Angio-edema   . COPD (chronic obstructive pulmonary disease) (HCC)    denies SOB with daily activities  . Full dentures   . GERD (gastroesophageal reflux disease)   . History of pancreatitis   . Hypothyroidism   . Nasal sinus congestion 05/08/2014   with sinus drainage  . PVC (premature ventricular contraction) 07/07/2017  . Stress incontinence   . Symptomatic cholelithiasis 05/2014  . Urticaria   . Varicose veins of bilateral lower extremities with pain   . Wears dentures    upper and lower  . Wears glasses     Current Outpatient Medications:  .  buPROPion (WELLBUTRIN SR) 100 MG 12 hr tablet, Take 1 tablet (100 mg total) by mouth daily., Disp: 90 tablet, Rfl: 1 .  diphenhydrAMINE (BENADRYL) 25 MG tablet, Take 25-50 mg by mouth daily as needed for itching (allergic reaction). , Disp: , Rfl:  .  famotidine (PEPCID) 10 MG tablet, Take 10 mg by mouth daily., Disp: , Rfl:  .  ibuprofen (ADVIL) 200 MG tablet, Take 400-800 mg by mouth every 6 (six) hours as needed for headache or moderate pain., Disp: , Rfl:  .  levocetirizine (XYZAL) 5 MG tablet, Take 5 mg by mouth at bedtime., Disp: , Rfl:  .  Polyethyl Glycol-Propyl Glycol 0.4-0.3 % SOLN, Place 1 drop into  both eyes 5 (five) times daily as needed (dry eyes/irriation)., Disp: , Rfl:  .  SYNTHROID 175 MCG tablet, TAKE 1 TABLET BY MOUTH EVERY DAY, Disp: 90 tablet, Rfl: 1 .  Vitamin D, Ergocalciferol, (DRISDOL) 1.25 MG (50000 UNIT) CAPS capsule, Take 1 capsule (50,000 Units total) by mouth 2 (two) times a week., Disp: 24 capsule, Rfl: 1  Social History   Tobacco Use  Smoking Status Current Every Day Smoker  . Packs/day: 0.50  . Types: Cigarettes  Smokeless Tobacco Never Used  Tobacco Comment   quit for six years, started back 09-2018 - currently on wellbutrin for smoking cessation    Allergies  Allergen Reactions  . Fish Allergy Hives  . Gadolinium Derivatives Hives, Itching and Rash    Pt stated she felt itching and burning in her throat immediately following the injection.  . Multihance [Gadobenate] Hives, Itching and Rash    Itching and burning in her throat. Hives and itching of her skin.  . Omnipaque [Iohexol] Hives  . Other Nausea And Vomiting    ALL NARCOTICS  . Peanut-Containing Drug Products Hives  . Shellfish Allergy Hives  . Codeine Nausea And Vomiting  . Hydrocodone-Acetaminophen Other (See Comments)  . Oxycodone Nausea And Vomiting  . Tramadol     headaches  Objective:  There were no vitals filed for this visit. There is no height or weight on file to calculate BMI. Constitutional Well developed. Well nourished.  Vascular Dorsalis pedis pulses palpable bilaterally. Posterior tibial pulses palpable bilaterally. Capillary refill normal to all digits.  No cyanosis or clubbing noted. Pedal hair growth normal.  Neurologic Normal speech. Oriented to person, place, and time. Epicritic sensation to light touch grossly present bilaterally.  Dermatologic Nails well groomed and normal in appearance. No open wounds. No skin lesions.  Orthopedic:  Pain on palpation along the course of the peroneal tendon. Mild pain at the insertion of the peroneal tendon. Pain with  resisted dorsiflexion eversion of the foot. No pain at the posterior tibial tendon, ATFL ligament. Mild pain at the Achilles tendon. No pain at the ankle joint range of motion. No deep intra-articular ankle joint pain noted.   Radiographs: None. Patient already has x-rays done recently at outside facility Assessment:   1. Peroneal tendinitis, right   2. Tendon tear, ankle, initial encounter    Plan:  Patient was evaluated and treated and all questions answered.  Right peroneal tendinitis -I explained the patient the etiology of peroneal tendinitis and various treatment options were extensively discussed. Given that patient also has an underlying pes cavus foot structure is likely putting excessive stress on the peroneal tendon leading to inflammation I believe patient will benefit from a steroid injection as the cam boot immobilization has not helped.  She also has not been very compliant with a cam boot as she states that she started get hip and back pain.  Also discussed with her given that this is near the tendon there is a risk of rupture associated with it.  Patient would like to proceed despite the risks. -A steroid injection was performed at right lateral foot at the point of maximal tenderness using 1% plain Lidocaine and 10 mg of Kenalog. This was well tolerated. -Tri-Lock ankle brace was dispensed -Patient was scheduled for an MRI to assess the peroneal tendon to rule out any kind of synovitis or tearing.  Pes cavus -I explained the patient the etiology of pes cavus and various treatment options were extensively discussed. Given that she has a high arch foot structure putting a lot of stress on the peroneal tendon I believe she will benefit from custom-made orthotics to help control the hindfoot motion support the arch of the foot and take the stress away from the peroneal tendon. -Awaiting bracing/orthotics  No follow-ups on file.

## 2020-10-22 ENCOUNTER — Other Ambulatory Visit: Payer: Self-pay

## 2020-10-22 ENCOUNTER — Ambulatory Visit (INDEPENDENT_AMBULATORY_CARE_PROVIDER_SITE_OTHER): Payer: BC Managed Care – PPO | Admitting: Podiatry

## 2020-10-22 DIAGNOSIS — M7671 Peroneal tendinitis, right leg: Secondary | ICD-10-CM

## 2020-10-22 DIAGNOSIS — Q667 Congenital pes cavus, unspecified foot: Secondary | ICD-10-CM

## 2020-10-22 DIAGNOSIS — S96919A Strain of unspecified muscle and tendon at ankle and foot level, unspecified foot, initial encounter: Secondary | ICD-10-CM

## 2020-10-22 NOTE — Patient Instructions (Signed)

## 2020-10-22 NOTE — Progress Notes (Signed)
Patient presents today for orthotic pick up. Patient voices no new complaints.  Orthotics were fitted to patient's feet. No discomfort and no rubbing. Patient satisfied with the orthotics.  Orthotics were dispensed to patient with instructions for break in wear and to call the office with any concerns or questions. 

## 2020-10-28 ENCOUNTER — Other Ambulatory Visit: Payer: Self-pay

## 2020-10-28 ENCOUNTER — Ambulatory Visit
Admission: RE | Admit: 2020-10-28 | Discharge: 2020-10-28 | Disposition: A | Discharge status: Home / Self Care | Payer: BC Managed Care – PPO | Source: Ambulatory Visit | Attending: Podiatry | Admitting: Podiatry

## 2020-10-28 DIAGNOSIS — M7671 Peroneal tendinitis, right leg: Secondary | ICD-10-CM

## 2020-10-28 DIAGNOSIS — S86311A Strain of muscle(s) and tendon(s) of peroneal muscle group at lower leg level, right leg, initial encounter: Secondary | ICD-10-CM | POA: Diagnosis not present

## 2020-10-28 DIAGNOSIS — M1288 Other specific arthropathies, not elsewhere classified, other specified site: Secondary | ICD-10-CM | POA: Diagnosis not present

## 2020-11-05 DIAGNOSIS — J209 Acute bronchitis, unspecified: Secondary | ICD-10-CM | POA: Diagnosis not present

## 2020-11-10 ENCOUNTER — Ambulatory Visit (INDEPENDENT_AMBULATORY_CARE_PROVIDER_SITE_OTHER): Payer: BC Managed Care – PPO | Admitting: Podiatry

## 2020-11-10 ENCOUNTER — Other Ambulatory Visit: Payer: Self-pay

## 2020-11-10 ENCOUNTER — Encounter: Payer: Self-pay | Admitting: Podiatry

## 2020-11-10 DIAGNOSIS — M7672 Peroneal tendinitis, left leg: Secondary | ICD-10-CM

## 2020-11-10 DIAGNOSIS — M7671 Peroneal tendinitis, right leg: Secondary | ICD-10-CM

## 2020-11-11 ENCOUNTER — Encounter: Payer: Self-pay | Admitting: Podiatry

## 2020-11-11 NOTE — Progress Notes (Signed)
Subjective:  Patient ID: Dana Martin, female    DOB: August 18, 1965,  MRN: [id number]  Chief Complaint  Patient presents with  . Ankle Pain    "Its doing much better, now my left ankle hurts in the same spot as my right but it isn't that bad yet"    55 y.o. female presents with the above complaint.  Patient presents with follow-up of right peroneal tendinitis.  She does not have any pain on the right side.  The injection helped considerably.  She has been able to ambulate without any issues.  Now she is having pain on the left side at the same exact spot likely due to left peroneal tendinitis.  May be due to compensation.  She states that she would like to discuss the same treatment options the right side.  She is also here to review her MRI.  She works at [employer] on a concrete surface.   Review of Systems: Negative except as noted in the HPI. Denies N/V/F/Ch.  Past Medical History:  Diagnosis Date  . Angio-edema   . COPD (chronic obstructive pulmonary disease) (HCC)    denies SOB with daily activities  . Full dentures   . GERD (gastroesophageal reflux disease)   . History of pancreatitis   . Hypothyroidism   . Nasal sinus congestion 05/08/2014   with sinus drainage  . PVC (premature ventricular contraction) 07/07/2017  . Stress incontinence   . Symptomatic cholelithiasis 05/2014  . Urticaria   . Varicose veins of bilateral lower extremities with pain   . Wears dentures    upper and lower  . Wears glasses     Current Outpatient Medications:  .  buPROPion (WELLBUTRIN SR) 100 MG 12 hr tablet, Take 1 tablet (100 mg total) by mouth daily., Disp: 90 tablet, Rfl: 1 .  diphenhydrAMINE (BENADRYL) 25 MG tablet, Take 25-50 mg by mouth daily as needed for itching (allergic reaction). , Disp: , Rfl:  .  famotidine (PEPCID) 10 MG tablet, Take 10 mg by mouth daily., Disp: , Rfl:  .  ibuprofen (ADVIL) 200 MG tablet, Take 400-800 mg by mouth every 6 (six) hours as needed for headache or  moderate pain., Disp: , Rfl:  .  levocetirizine (XYZAL) 5 MG tablet, Take 5 mg by mouth at bedtime., Disp: , Rfl:  .  Polyethyl Glycol-Propyl Glycol 0.4-0.3 % SOLN, Place 1 drop into both eyes 5 (five) times daily as needed (dry eyes/irriation)., Disp: , Rfl:  .  SYNTHROID 175 MCG tablet, TAKE 1 TABLET BY MOUTH EVERY DAY, Disp: 90 tablet, Rfl: 1 .  Vitamin D, Ergocalciferol, (DRISDOL) 1.25 MG (50000 UNIT) CAPS capsule, Take 1 capsule (50,000 Units total) by mouth 2 (two) times a week., Disp: 24 capsule, Rfl: 1  Social History   Tobacco Use  Smoking Status Current Every Day Smoker  . Packs/day: 0.50  . Types: Cigarettes  Smokeless Tobacco Never Used  Tobacco Comment   quit for six years, started back 09-2018 - currently on wellbutrin for smoking cessation    Allergies  Allergen Reactions  . Fish Allergy Hives  . Gadolinium Derivatives Hives, Itching and Rash    Pt stated she felt itching and burning in her throat immediately following the injection.  . Multihance [Gadobenate] Hives, Itching and Rash    Itching and burning in her throat. Hives and itching of her skin.  . Omnipaque [Iohexol] Hives  . Other Nausea And Vomiting    ALL NARCOTICS  . Peanut-Containing Drug Products Hives  .  Shellfish Allergy Hives  . Codeine Nausea And Vomiting  . Hydrocodone-Acetaminophen Other (See Comments)  . Oxycodone Nausea And Vomiting  . Tramadol     headaches   Objective:  There were no vitals filed for this visit. There is no height or weight on file to calculate BMI. Constitutional Well developed. Well nourished.  Vascular Dorsalis pedis pulses palpable bilaterally. Posterior tibial pulses palpable bilaterally. Capillary refill normal to all digits.  No cyanosis or clubbing noted. Pedal hair growth normal.  Neurologic Normal speech. Oriented to person, place, and time. Epicritic sensation to light touch grossly present bilaterally.  Dermatologic Nails well groomed and normal in  appearance. No open wounds. No skin lesions.  Orthopedic:  No pain on the right foot.  The peroneal tendinitis has resolved  Left foot exam Pain on palpation along the course of the peroneal tendon. Mild pain at the insertion of the peroneal tendon. Pain with resisted dorsiflexion eversion of the foot. No pain at the posterior tibial tendon, ATFL ligament. Mild pain at the Achilles tendon. No pain at the ankle joint range of motion. No deep intra-articular ankle joint pain noted.   Radiographs: None. Patient already has x-rays done recently at outside facility  IMPRESSION: 1. Tendinosis with extensive long segment longitudinal split tear of the peroneus longus tendon. 2. Prominent bone marrow edema within the lateral aspect of the calcaneal body adjacent to the peroneal tubercle, likely reactive. 3. Pes cavovarus alignment. 4. Moderate arthropathy of the calcaneocuboid joint. 5. Findings suggest sequela of remote ATFL injury.  Assessment:   1. Peroneal tendinitis, right   2. Peroneal tendinitis of left lower extremity    Plan:  Patient was evaluated and treated and all questions answered.  Left peroneal tendinitis -explained the patient the etiology of peroneal tendinitis and various treatment options were extensively discussed. Given that patient also has an underlying pes cavus foot structure is likely putting excessive stress on the peroneal tendon leading to inflammation I believe patient will benefit from a steroid injection as the cam boot immobilization has not helped.  Also discussed with her given that this is near the tendon there is a risk of rupture associated with it.  Patient would like to proceed despite the risks. -A steroid injection was performed at right lateral foot at the point of maximal tenderness using 1% plain Lidocaine and 10 mg of Kenalog. This was well tolerated.  Right peroneal tendinitis -Clinically healed with a steroid injection.  However I reviewed  the MRI with the patient which shows extensive tearing of the peroneal tendon.  I discussed with her if her pain returns and she is unable to continue working we may need to discuss surgical option at that time.  Patient states understanding.  Pes cavus -I explained the patient the etiology of pes cavus and various treatment options were extensively discussed. Given that she has a high arch foot structure putting a lot of stress on the peroneal tendon I believe she will benefit from custom-made orthotics to help control the hindfoot motion support the arch of the foot and take the stress away from the peroneal tendon. -Orthotics have been dispensed and is functioning well without acute complaints.  No follow-ups on file.

## 2020-12-08 ENCOUNTER — Ambulatory Visit (INDEPENDENT_AMBULATORY_CARE_PROVIDER_SITE_OTHER): Payer: BC Managed Care – PPO | Admitting: Nurse Practitioner

## 2020-12-08 ENCOUNTER — Other Ambulatory Visit: Payer: Self-pay

## 2020-12-08 ENCOUNTER — Encounter: Payer: Self-pay | Admitting: Nurse Practitioner

## 2020-12-08 VITALS — BP 112/78 | HR 92 | Temp 98.1°F | Ht 65.8 in | Wt 190.6 lb

## 2020-12-08 DIAGNOSIS — E039 Hypothyroidism, unspecified: Secondary | ICD-10-CM

## 2020-12-08 DIAGNOSIS — E559 Vitamin D deficiency, unspecified: Secondary | ICD-10-CM | POA: Diagnosis not present

## 2020-12-08 DIAGNOSIS — E785 Hyperlipidemia, unspecified: Secondary | ICD-10-CM

## 2020-12-08 DIAGNOSIS — M7671 Peroneal tendinitis, right leg: Secondary | ICD-10-CM

## 2020-12-08 DIAGNOSIS — Z0000 Encounter for general adult medical examination without abnormal findings: Secondary | ICD-10-CM | POA: Diagnosis not present

## 2020-12-08 DIAGNOSIS — Z720 Tobacco use: Secondary | ICD-10-CM

## 2020-12-08 DIAGNOSIS — Z6830 Body mass index (BMI) 30.0-30.9, adult: Secondary | ICD-10-CM

## 2020-12-08 NOTE — Patient Instructions (Addendum)
Health Maintenance, Female Adopting a healthy lifestyle and getting preventive care are important in promoting health and wellness. Ask your health care provider about:  The right schedule for you to have regular tests and exams.  Things you can do on your own to prevent diseases and keep yourself healthy. What should I know about diet, weight, and exercise? Eat a healthy diet  Eat a diet that includes plenty of vegetables, fruits, low-fat dairy products, and lean protein.  Do not eat a lot of foods that are high in solid fats, added sugars, or sodium.   Maintain a healthy weight Body mass index (BMI) is used to identify weight problems. It estimates body fat based on height and weight. Your health care provider can help determine your BMI and help you achieve or maintain a healthy weight. Get regular exercise Get regular exercise. This is one of the most important things you can do for your health. Most adults should:  Exercise for at least 150 minutes each week. The exercise should increase your heart rate and make you sweat (moderate-intensity exercise).  Do strengthening exercises at least twice a week. This is in addition to the moderate-intensity exercise.  Spend less time sitting. Even light physical activity can be beneficial. Watch cholesterol and blood lipids Have your blood tested for lipids and cholesterol at 57 years of age, then have this test every 5 years. Have your cholesterol levels checked more often if:  Your lipid or cholesterol levels are high.  You are older than 57 years of age.  You are at high risk for heart disease. What should I know about cancer screening? Depending on your health history and family history, you may need to have cancer screening at various ages. This may include screening for:  Breast cancer.  Cervical cancer.  Colorectal cancer.  Skin cancer.  Lung cancer. What should I know about heart disease, diabetes, and high blood  pressure? Blood pressure and heart disease  High blood pressure causes heart disease and increases the risk of stroke. This is more likely to develop in people who have high blood pressure readings, are of African descent, or are overweight.  Have your blood pressure checked: ? Every 3-5 years if you are 18-39 years of age. ? Every year if you are 40 years old or older. Diabetes Have regular diabetes screenings. This checks your fasting blood sugar level. Have the screening done:  Once every three years after age 40 if you are at a normal weight and have a low risk for diabetes.  More often and at a younger age if you are overweight or have a high risk for diabetes. What should I know about preventing infection? Hepatitis B If you have a higher risk for hepatitis B, you should be screened for this virus. Talk with your health care provider to find out if you are at risk for hepatitis B infection. Hepatitis C Testing is recommended for:  Everyone born from 1945 through 1965.  Anyone with known risk factors for hepatitis C. Sexually transmitted infections (STIs)  Get screened for STIs, including gonorrhea and chlamydia, if: ? You are sexually active and are younger than 57 years of age. ? You are older than 57 years of age and your health care provider tells you that you are at risk for this type of infection. ? Your sexual activity has changed since you were last screened, and you are at increased risk for chlamydia or gonorrhea. Ask your health care provider   if you are at risk.  Ask your health care provider about whether you are at high risk for HIV. Your health care provider may recommend a prescription medicine to help prevent HIV infection. If you choose to take medicine to prevent HIV, you should first get tested for HIV. You should then be tested every 3 months for as long as you are taking the medicine. Pregnancy  If you are about to stop having your period (premenopausal) and  you may become pregnant, seek counseling before you get pregnant.  Take 400 to 800 micrograms (mcg) of folic acid every day if you become pregnant.  Ask for birth control (contraception) if you want to prevent pregnancy. Osteoporosis and menopause Osteoporosis is a disease in which the bones lose minerals and strength with aging. This can result in bone fractures. If you are 84 years old or older, or if you are at risk for osteoporosis and fractures, ask your health care provider if you should:  Be screened for bone loss.  Take a calcium or vitamin D supplement to lower your risk of fractures.  Be given hormone replacement therapy (HRT) to treat symptoms of menopause. Follow these instructions at home: Lifestyle  Do not use any products that contain nicotine or tobacco, such as cigarettes, e-cigarettes, and chewing tobacco. If you need help quitting, ask your health care provider.  Do not use street drugs.  Do not share needles.  Ask your health care provider for help if you need support or information about quitting drugs. Alcohol use  Do not drink alcohol if: ? Your health care provider tells you not to drink. ? You are pregnant, may be pregnant, or are planning to become pregnant.  If you drink alcohol: ? Limit how much you use to 0-1 drink a day. ? Limit intake if you are breastfeeding.  Be aware of how much alcohol is in your drink. In the [place] one drink equals one 12 oz bottle of beer (355 mL), one 5 oz glass of wine (148 mL), or one 1 oz glass of hard liquor (44 mL). General instructions  Schedule regular health, dental, and eye exams.  Stay current with your vaccines.  Tell your health care provider if: ? You often feel depressed. ? You have ever been abused or do not feel safe at home. Summary  Adopting a healthy lifestyle and getting preventive care are important in promoting health and wellness.  Follow your health care provider's instructions about healthy  diet, exercising, and getting tested or screened for diseases.  Follow your health care provider's instructions on monitoring your cholesterol and blood pressure. This information is not intended to replace advice given to you by your health care provider. Make sure you discuss any questions you have with your health care provider. Document Revised: 07/18/2018 Document Reviewed: 07/18/2018 Elsevier Patient Education  2021 [employer].  [employer] - call them to see what happened to your Cologuard [phone]

## 2020-12-08 NOTE — Progress Notes (Signed)
I,Yamilka Roman [employer] as a Education administrator for [employer], FNP.,have documented all relevant documentation on the behalf of [name], FNP,as directed by  [name], FNP while in the presence of [name], [place]. This visit occurred during the SARS-CoV-2 public health emergency.  Safety protocols were in place, including screening questions prior to the visit, additional usage of staff PPE, and extensive cleaning of exam room while observing appropriate contact time as indicated for disinfecting solutions.  Subjective:     Patient ID: Dana Martin , female    DOB: Dec 27, 1965 , 55 y.o.   MRN: [id number]   Chief Complaint  Patient presents with  . Annual Exam    HPI  Patient here for physical. She has seen the Podiatrist and had Cortisone injection, she has tears to her right foot. She was seen at Urgent Care in March - had URI.    Wt Readings from Last 3 Encounters: 12/08/20 : 190 lb 9.6 oz (86.5 kg) 08/19/20 : 191 lb 12.8 oz (87 kg) 04/02/20 : 186 lb (84.4 kg) Thyroid Problem Presents for follow-up visit. Patient reports no anxiety, fatigue or palpitations. The symptoms have been stable.     Past Medical History:  Diagnosis Date  . Angio-edema   . COPD (chronic obstructive pulmonary disease) ([place])    denies SOB with daily activities  . Full dentures   . GERD (gastroesophageal reflux disease)   . History of pancreatitis   . Hypothyroidism   . Nasal sinus congestion 05/08/2014   with sinus drainage  . PVC (premature ventricular contraction) 07/07/2017  . Stress incontinence   . Symptomatic cholelithiasis 05/2014  . Urticaria   . Varicose veins of bilateral lower extremities with pain   . Wears dentures    upper and lower  . Wears glasses      Family History  Problem Relation Age of Onset  . Hypertension Mother   . Atrial fibrillation Mother   . Heart attack Mother   . Hypertension Sister   . Hypertension Brother   . Hypertension Daughter   . Lung cancer  Brother   . Diabetes Sister      Current Outpatient Medications:  .  buPROPion (WELLBUTRIN SR) 100 MG 12 hr tablet, Take 1 tablet (100 mg total) by mouth daily., Disp: 90 tablet, Rfl: 1 .  diphenhydrAMINE (BENADRYL) 25 MG tablet, Take 25-50 mg by mouth daily as needed for itching (allergic reaction). , Disp: , Rfl:  .  famotidine (PEPCID) 10 MG tablet, Take 10 mg by mouth daily., Disp: , Rfl:  .  ibuprofen (ADVIL) 200 MG tablet, Take 400-800 mg by mouth every 6 (six) hours as needed for headache or moderate pain., Disp: , Rfl:  .  levocetirizine (XYZAL) 5 MG tablet, Take 5 mg by mouth at bedtime., Disp: , Rfl:  .  Polyethyl Glycol-Propyl Glycol 0.4-0.3 % SOLN, Place 1 drop into both eyes 5 (five) times daily as needed (dry eyes/irriation)., Disp: , Rfl:  .  SYNTHROID 175 MCG tablet, TAKE 1 TABLET BY MOUTH EVERY DAY, Disp: 90 tablet, Rfl: 1 .  Vitamin D, Ergocalciferol, (DRISDOL) 1.25 MG (50000 UNIT) CAPS capsule, Take 1 capsule (50,000 Units total) by mouth 2 (two) times a week., Disp: 24 capsule, Rfl: 1   Allergies  Allergen Reactions  . Fish Allergy Hives  . Gadolinium Derivatives Hives, Itching and Rash    Pt stated she felt itching and burning in her throat immediately following the injection.  . Multihance [Gadobenate] Hives, Itching  and Rash    Itching and burning in her throat. Hives and itching of her skin.  . Omnipaque [Iohexol] Hives  . Other Nausea And Vomiting    ALL NARCOTICS  . Peanut-Containing Drug Products Hives  . Shellfish Allergy Hives  . Codeine Nausea And Vomiting  . Hydrocodone-Acetaminophen Other (See Comments)  . Oxycodone Nausea And Vomiting  . Tramadol     headaches      The patient states she uses status post hysterectomy for birth control.  No LMP recorded. Patient has had a hysterectomy.. Negative for Dysmenorrhea and Negative for Menorrhagia. Negative for: breast discharge, breast lump(s), breast pain and breast self exam. Associated symptoms include  abnormal vaginal bleeding. Pertinent negatives include abnormal bleeding (hematology), anxiety, decreased libido, depression, difficulty falling sleep, dyspareunia, history of infertility, nocturia, sexual dysfunction, sleep disturbances, urinary incontinence, urinary urgency, vaginal discharge and vaginal itching. Diet regular. The patient states her exercise level is none, continues to work at [employer]  The patient's tobacco use is:  Social History   Tobacco Use  Smoking Status Current Every Day Smoker  . Packs/day: 1.00  . Years: 25.00  . Pack years: 25.00  . Types: Cigarettes  Smokeless Tobacco Never Used  Tobacco Comment   quit for six years, started back 09-2018 - currently on wellbutrin for smoking cessation   She has been exposed to passive smoke. The patient's alcohol use is:  Social History   Substance and Sexual Activity  Alcohol Use Not Currently     Review of Systems  Constitutional: Negative.  Negative for fatigue.  HENT: Negative.   Eyes: Negative.   Respiratory: Negative.  Negative for cough.   Cardiovascular: Negative.  Negative for chest pain, palpitations and leg swelling.  Gastrointestinal: Negative.   Endocrine: Negative.  Negative for polydipsia, polyphagia and polyuria.  Genitourinary: Negative.   Musculoskeletal: Negative.   Skin: Negative.   Allergic/Immunologic: Negative.   Neurological: Negative.   Hematological: Negative.   Psychiatric/Behavioral: Negative.  The patient is not nervous/anxious.      Today's Vitals   12/08/20 0902  BP: 112/78  Pulse: 92  Temp: 98.1 F (36.7 C)  TempSrc: Oral  Weight: 190 lb 9.6 oz (86.5 kg)  Height: 5' 5.8" (1.671 m)  PainSc: 0-No pain   Body mass index is 30.95 kg/m.   Objective:  Physical Exam Constitutional:      General: She is not in acute distress.    Appearance: Normal appearance. She is well-developed.  HENT:     Head: Normocephalic and atraumatic.     Right Ear: Hearing, tympanic  membrane, ear canal and external ear normal. There is no impacted cerumen.     Left Ear: Hearing, tympanic membrane, ear canal and external ear normal. There is no impacted cerumen.     Nose:     Comments: Deferred - masked    Mouth/Throat:     Comments: Deferred - masked Eyes:     General: Lids are normal.     Extraocular Movements: Extraocular movements intact.     Conjunctiva/sclera: Conjunctivae normal.     Pupils: Pupils are equal, round, and reactive to light.     Funduscopic exam:    Right eye: No papilledema.        Left eye: No papilledema.  Neck:     Thyroid: No thyroid mass.     Vascular: No carotid bruit.  Cardiovascular:     Rate and Rhythm: Normal rate and regular rhythm.  Pulses: Normal pulses.     Heart sounds: Normal heart sounds. No murmur heard.   Pulmonary:     Effort: Pulmonary effort is normal. No respiratory distress.     Breath sounds: Normal breath sounds. No wheezing.  Abdominal:     General: Abdomen is flat. Bowel sounds are normal. There is no distension.     Palpations: Abdomen is soft. There is no mass.     Tenderness: There is no abdominal tenderness.  Musculoskeletal:        General: No swelling or tenderness. Normal range of motion.     Cervical back: Full passive range of motion without pain, normal range of motion and neck supple.     Right lower leg: No edema.     Left lower leg: No edema.  Skin:    General: Skin is warm and dry.     Capillary Refill: Capillary refill takes less than 2 seconds.  Neurological:     General: No focal deficit present.     Mental Status: She is alert and oriented to person, place, and time.     Cranial Nerves: No cranial nerve deficit.     Sensory: No sensory deficit.  Psychiatric:        Mood and Affect: Mood normal.        Behavior: Behavior normal.        Thought Content: Thought content normal.        Judgment: Judgment normal.         Assessment And Plan:     1. Encounter for general adult  medical examination w/o abnormal findings . Behavior modifications discussed and diet history reviewed.   . Pt will continue to exercise regularly and modify diet with low GI, plant based foods and decrease intake of processed foods.  . Recommend intake of daily multivitamin, Vitamin D, and calcium.  . Recommend mammogram and colonoscopy (she reports she did a cologuard however [facility] has not received I have given her the number for them to call to see next steps) for preventive screenings, as well as recommend immunizations that include influenza, TDAP (up to date) - CBC  2. Vitamin D deficiency  Will check vitamin D level and supplement as needed.     Also encouraged to spend 15 minutes in the sun daily.  - Vitamin D (25 hydroxy)  3. Acquired hypothyroidism  Chronic, controlled  Continue with current medications, tolerating well - TSH - T3, free - T4  4. Hyperlipidemia, unspecified hyperlipidemia type  Chronic, controlled  No current medications, diet controlled - CMP14+EGFR - Lipid panel  5. BMI 30.0-30.9,adult  Chronic  Discussed healthy diet and regular exercise options   Encouraged to exercise at least 150 minutes per week with 2 days of strength training as tolerated  Waist circumference 41.5 cm.  6. Peroneal tendinitis of right lower extremity  She is being followed by podiatry   7. Tobacco abuse  She has a total history of smoking 25 years and continues to smoke today - CT CHEST LUNG CA SCREEN LOW DOSE W/O CM; Future    Patient was given opportunity to ask questions. Patient verbalized understanding of the plan and was able to repeat key elements of the plan. All questions were answered to their satisfaction.   [name], FNP   I, [name], FNP, have reviewed all documentation for this visit. The documentation on 12/08/20 for the exam, diagnosis, procedures, and orders are all accurate and complete.  THE PATIENT  IS ENCOURAGED TO PRACTICE  SOCIAL DISTANCING DUE TO THE COVID-19 PANDEMIC.

## 2020-12-09 LAB — CMP14+EGFR
ALT: 11 IU/L (ref 0–32)
AST: 15 IU/L (ref 0–40)
Albumin/Globulin Ratio: 1.5 (ref 1.2–2.2)
Albumin: 4.4 g/dL (ref 3.8–4.9)
Alkaline Phosphatase: 99 IU/L (ref 44–121)
BUN/Creatinine Ratio: 15 (ref 9–23)
BUN: 10 mg/dL (ref 6–24)
Bilirubin Total: 0.2 mg/dL (ref 0.0–1.2)
CO2: 26 mmol/L (ref 20–29)
Calcium: 9.5 mg/dL (ref 8.7–10.2)
Chloride: 99 mmol/L (ref 96–106)
Creatinine, Ser: 0.66 mg/dL (ref 0.57–1.00)
Globulin, Total: 2.9 g/dL (ref 1.5–4.5)
Glucose: 87 mg/dL (ref 65–99)
Potassium: 5 mmol/L (ref 3.5–5.2)
Sodium: 138 mmol/L (ref 134–144)
Total Protein: 7.3 g/dL (ref 6.0–8.5)
eGFR: 103 mL/min/{1.73_m2} (ref >59)

## 2020-12-09 LAB — CBC
Hematocrit: 39.3 % (ref 34.0–46.6)
Hemoglobin: 12.2 g/dL (ref 11.1–15.9)
MCH: 26.3 pg — ABNORMAL LOW (ref 26.6–33.0)
MCHC: 31 g/dL — ABNORMAL LOW (ref 31.5–35.7)
MCV: 85 fL (ref 79–97)
Platelets: 367 10*3/uL (ref 150–450)
RBC: 4.64 x10E6/uL (ref 3.77–5.28)
RDW: 15.4 % (ref 11.7–15.4)
WBC: 8 10*3/uL (ref 3.4–10.8)

## 2020-12-09 LAB — T3, FREE: T3, Free: 2.5 pg/mL (ref 2.0–4.4)

## 2020-12-09 LAB — VITAMIN D 25 HYDROXY (VIT D DEFICIENCY, FRACTURES): Vit D, 25-Hydroxy: 18.4 ng/mL — ABNORMAL LOW (ref 30.0–100.0)

## 2020-12-09 LAB — LIPID PANEL
Chol/HDL Ratio: 3.2 ratio (ref 0.0–4.4)
Cholesterol, Total: 222 mg/dL — ABNORMAL HIGH (ref 100–199)
HDL: 69 mg/dL (ref >39)
LDL Chol Calc (NIH): 140 mg/dL — ABNORMAL HIGH (ref 0–99)
Triglycerides: 76 mg/dL (ref 0–149)
VLDL Cholesterol Cal: 13 mg/dL (ref 5–40)

## 2020-12-09 LAB — TSH: TSH: 2.59 u[IU]/mL (ref 0.450–4.500)

## 2020-12-09 LAB — T4: T4, Total: 12.5 ug/dL — ABNORMAL HIGH (ref 4.5–12.0)

## 2020-12-23 ENCOUNTER — Other Ambulatory Visit: Payer: Self-pay | Admitting: Nurse Practitioner

## 2020-12-23 DIAGNOSIS — F17210 Nicotine dependence, cigarettes, uncomplicated: Secondary | ICD-10-CM

## 2021-01-05 ENCOUNTER — Encounter: Payer: Self-pay | Admitting: Podiatry

## 2021-01-05 ENCOUNTER — Other Ambulatory Visit: Payer: Self-pay

## 2021-01-05 ENCOUNTER — Ambulatory Visit (INDEPENDENT_AMBULATORY_CARE_PROVIDER_SITE_OTHER): Payer: BC Managed Care – PPO | Admitting: Podiatry

## 2021-01-05 DIAGNOSIS — M7672 Peroneal tendinitis, left leg: Secondary | ICD-10-CM | POA: Diagnosis not present

## 2021-01-05 DIAGNOSIS — M7671 Peroneal tendinitis, right leg: Secondary | ICD-10-CM

## 2021-01-05 NOTE — Progress Notes (Signed)
Subjective:  Patient ID: Dana Martin, female    DOB: [birth year],  MRN: [id number]  Chief Complaint  Patient presents with  . Foot Pain    "it was doing better after injection, now I can feel it coming back again and my left foot is also starting to bother me"    55 y.o. female presents with the above complaint.  Patient presents with follow-up of bilateral peroneal tendinitis.  The right side started to act back up a little bit.  However pain is nowhere near what it was previously.  She does have tearing due to shortening on the MRI.  However for today she just wanted to discuss conservative treatment options.  For most part she is completely healed.   Review of Systems: Negative except as noted in the HPI. Denies N/V/F/Ch.  Past Medical History:  Diagnosis Date  . Angio-edema   . COPD (chronic obstructive pulmonary disease) (HCC)    denies SOB with daily activities  . Full dentures   . GERD (gastroesophageal reflux disease)   . History of pancreatitis   . Hypothyroidism   . Nasal sinus congestion 05/08/2014   with sinus drainage  . PVC (premature ventricular contraction) 07/07/2017  . Stress incontinence   . Symptomatic cholelithiasis 05/2014  . Urticaria   . Varicose veins of bilateral lower extremities with pain   . Wears dentures    upper and lower  . Wears glasses     Current Outpatient Medications:  .  buPROPion (WELLBUTRIN SR) 100 MG 12 hr tablet, Take 1 tablet (100 mg total) by mouth daily., Disp: 90 tablet, Rfl: 1 .  diphenhydrAMINE (BENADRYL) 25 MG tablet, Take 25-50 mg by mouth daily as needed for itching (allergic reaction). , Disp: , Rfl:  .  famotidine (PEPCID) 10 MG tablet, Take 10 mg by mouth daily., Disp: , Rfl:  .  ibuprofen (ADVIL) 200 MG tablet, Take 400-800 mg by mouth every 6 (six) hours as needed for headache or moderate pain., Disp: , Rfl:  .  levocetirizine (XYZAL) 5 MG tablet, Take 5 mg by mouth at bedtime., Disp: , Rfl:  .  Polyethyl  Glycol-Propyl Glycol 0.4-0.3 % SOLN, Place 1 drop into both eyes 5 (five) times daily as needed (dry eyes/irriation)., Disp: , Rfl:  .  SYNTHROID 175 MCG tablet, TAKE 1 TABLET BY MOUTH EVERY DAY, Disp: 90 tablet, Rfl: 1 .  Vitamin D, Ergocalciferol, (DRISDOL) 1.25 MG (50000 UNIT) CAPS capsule, Take 1 capsule (50,000 Units total) by mouth 2 (two) times a week., Disp: 24 capsule, Rfl: 1  Social History   Tobacco Use  Smoking Status Current Every Day Smoker  . Packs/day: 1.00  . Years: 25.00  . Pack years: 25.00  . Types: Cigarettes  Smokeless Tobacco Never Used  Tobacco Comment   quit for six years, started back 09-2018 - currently on wellbutrin for smoking cessation    Allergies  Allergen Reactions  . Fish Allergy Hives  . Gadolinium Derivatives Hives, Itching and Rash    Pt stated she felt itching and burning in her throat immediately following the injection.  . Multihance [Gadobenate] Hives, Itching and Rash    Itching and burning in her throat. Hives and itching of her skin.  . Omnipaque [Iohexol] Hives  . Other Nausea And Vomiting    ALL NARCOTICS  . Peanut-Containing Drug Products Hives  . Shellfish Allergy Hives  . Codeine Nausea And Vomiting  . Hydrocodone-Acetaminophen Other (See Comments)  . Oxycodone Nausea And Vomiting  .  Tramadol     headaches   Objective:  There were no vitals filed for this visit. There is no height or weight on file to calculate BMI. Constitutional Well developed. Well nourished.  Vascular Dorsalis pedis pulses palpable bilaterally. Posterior tibial pulses palpable bilaterally. Capillary refill normal to all digits.  No cyanosis or clubbing noted. Pedal hair growth normal.  Neurologic Normal speech. Oriented to person, place, and time. Epicritic sensation to light touch grossly present bilaterally.  Dermatologic Nails well groomed and normal in appearance. No open wounds. No skin lesions.  Orthopedic:  No pain on the right foot.  The  peroneal tendinitis has resolved  Left foot exam No pain on palpation along the course of the peroneal tendon. Mild pain at the insertion of the peroneal tendon.  No pain with resisted dorsiflexion eversion of the foot. No pain at the posterior tibial tendon, ATFL ligament. Mild pain at the Achilles tendon. No pain at the ankle joint range of motion. No deep intra-articular ankle joint pain noted.   Radiographs: None. Patient already has x-rays done recently at outside facility  IMPRESSION: 1. Tendinosis with extensive long segment longitudinal split tear of the peroneus longus tendon. 2. Prominent bone marrow edema within the lateral aspect of the calcaneal body adjacent to the peroneal tubercle, likely reactive. 3. Pes cavovarus alignment. 4. Moderate arthropathy of the calcaneocuboid joint. 5. Findings suggest sequela of remote ATFL injury.  Assessment:   1. Peroneal tendinitis, right   2. Peroneal tendinitis of left lower extremity    Plan:  Patient was evaluated and treated and all questions answered.  Left peroneal tendinitis -Clinically healed.  At this time I discussed orthotics and shoe gear modification extensive detail.  She states understanding.  If her pain recurs will need to discuss surgical option.  Right peroneal tendinitis -Clinically healed with a steroid injection.  However I reviewed the MRI with the patient which shows extensive tearing of the peroneal tendon.  I discussed with her if her pain returns and she is unable to continue working we may need to discuss surgical option at that time.  Patient states understanding.  I discussed using bracing and orthotics for residual pain.  Pes cavus -I explained the patient the etiology of pes cavus and various treatment options were extensively discussed. Given that she has a high arch foot structure putting a lot of stress on the peroneal tendon I believe she will benefit from custom-made orthotics to help control the  hindfoot motion support the arch of the foot and take the stress away from the peroneal tendon. -Orthotics have been dispensed and is functioning well without acute complaints.  No follow-ups on file.

## 2021-01-12 ENCOUNTER — Inpatient Hospital Stay: Admission: RE | Admit: 2021-01-12 | Payer: BC Managed Care – PPO | Source: Ambulatory Visit

## 2021-02-10 ENCOUNTER — Ambulatory Visit: Payer: BC Managed Care – PPO

## 2021-02-17 ENCOUNTER — Ambulatory Visit: Payer: BC Managed Care – PPO | Admitting: Nurse Practitioner

## 2021-03-09 ENCOUNTER — Ambulatory Visit: Payer: BC Managed Care – PPO | Admitting: Nurse Practitioner

## 2021-03-09 ENCOUNTER — Encounter: Payer: Self-pay | Admitting: Nurse Practitioner

## 2021-03-09 ENCOUNTER — Other Ambulatory Visit: Payer: Self-pay

## 2021-03-09 VITALS — BP 138/88 | HR 82 | Temp 97.7°F | Ht 65.0 in | Wt 193.4 lb

## 2021-03-09 DIAGNOSIS — Z1231 Encounter for screening mammogram for malignant neoplasm of breast: Secondary | ICD-10-CM

## 2021-03-09 DIAGNOSIS — Z6830 Body mass index (BMI) 30.0-30.9, adult: Secondary | ICD-10-CM | POA: Diagnosis not present

## 2021-03-09 DIAGNOSIS — E039 Hypothyroidism, unspecified: Secondary | ICD-10-CM | POA: Diagnosis not present

## 2021-03-09 DIAGNOSIS — Z1211 Encounter for screening for malignant neoplasm of colon: Secondary | ICD-10-CM

## 2021-03-09 NOTE — Patient Instructions (Signed)
Hyperthyroidism  Hyperthyroidism is when the thyroid gland is too active (overactive). The thyroid gland is a small gland located in the lower front part of the neck, just in front of the windpipe (trachea). This gland makes hormones that help control how the body uses food for energy (metabolism) as well as how the heart and brain function. These hormones also play a role in keeping your bones strong. When the thyroid is overactive, it produces toomuch of a hormone called thyroxine. What are the causes? This condition may be caused by: Graves' disease. This is a disorder in which the body's disease-fighting system (immune system) attacks the thyroid gland. This is the most common cause. Inflammation of the thyroid gland. A tumor in the thyroid gland. Use of certain medicines, including: Prescription thyroid hormone replacement. Herbal supplements that mimic thyroid hormones. Amiodarone therapy. Solid or fluid-filled lumps within your thyroid gland (thyroid nodules). Taking in a large amount of iodine from foods or medicines. What increases the risk? You are more likely to develop this condition if: You are female. You have a family history of thyroid conditions. You smoke tobacco. You use a medicine called lithium. You take medicines that affect the immune system (immunosuppressants). What are the signs or symptoms? Symptoms of this condition include: Nervousness. Inability to tolerate heat. Unexplained weight loss. Diarrhea. Change in the texture of hair or skin. Heart skipping beats or making extra beats. Rapid heart rate. Loss of menstruation. Shaky hands. Fatigue. Restlessness. Sleep problems. Enlarged thyroid gland or a lump in the thyroid (nodule). You may also have symptoms of Graves' disease, which may include: Protruding eyes. Dry eyes. Red or swollen eyes. Problems with vision. How is this diagnosed? This condition may be diagnosed based on: Your symptoms and  medical history. A physical exam. Blood tests. Thyroid ultrasound. This test involves using sound waves to produce images of the thyroid gland. A thyroid scan. A radioactive substance is injected into a vein, and images show how much iodine is present in the thyroid. Radioactive iodine uptake test (RAIU). A small amount of radioactive iodine is given by mouth to see how much iodine the thyroid absorbs after a certain amount of time. How is this treated? Treatment depends on the cause and severity of the condition. Treatment may include: Medicines to reduce the amount of thyroid hormone your body makes. Radioactive iodine treatment (radioiodine therapy). This involves swallowing a small dose of radioactive iodine, in capsule or liquid form, to kill thyroid cells. Surgery to remove part or all of your thyroid gland. You may need to take thyroid hormone replacement medicine for the rest of your life after thyroid surgery. Medicines to help manage your symptoms. Follow these instructions at home:  Take over-the-counter and prescription medicines only as told by your health care provider. Do not use any products that contain nicotine or tobacco, such as cigarettes and e-cigarettes. If you need help quitting, ask your health care provider. Follow any instructions from your health care provider about diet. You may be instructed to limit foods that contain iodine. Keep all follow-up visits as told by your health care provider. This is important. You will need to have blood tests regularly so that your health care provider can monitor your condition. Contact a health care provider if: Your symptoms do not get better with treatment. You have a fever. You are taking thyroid hormone replacement medicine and you: Have symptoms of depression. Feel like you are tired all the time. Gain weight. Get help right   away if: You have chest pain. You have decreased alertness or a change in your awareness. You  have abdominal pain. You feel dizzy. You have a rapid heartbeat. You have an irregular heartbeat. You have difficulty breathing. Summary The thyroid gland is a small gland located in the lower front part of the neck, just in front of the windpipe (trachea). Hyperthyroidism is when the thyroid gland is too active (overactive) and produces too much of a hormone called thyroxine. The most common cause is Graves' disease, a disorder in which your immune system attacks the thyroid gland. Hyperthyroidism can cause various symptoms, such as unexplained weight loss, nervousness, inability to tolerate heat, or changes in your heartbeat. Treatment may include medicine to reduce the amount of thyroid hormone your body makes, radioiodine therapy, surgery, or medicines to manage symptoms. This information is not intended to replace advice given to you by your health care provider. Make sure you discuss any questions you have with your healthcare provider. Document Revised: 04/09/2020 Document Reviewed: 04/09/2020 Elsevier Patient Education  2022 [employer]  

## 2021-03-09 NOTE — Progress Notes (Signed)
I,Kaicee Scarpino,acting as a Neurosurgeon for Arnette Felts, FNP.,have documented all relevant documentation on the behalf of Arnette Felts, FNP,as directed by  Arnette Felts, FNP while in the presence of Arnette Felts, FNP.  This visit occurred during the SARS-CoV-2 public health emergency.  Safety protocols were in place, including screening questions prior to the visit, additional usage of staff PPE, and extensive cleaning of exam room while observing appropriate contact time as indicated for disinfecting solutions.  Subjective:     Patient ID: Dana Martin , female    DOB: 12/09/1965 , 55 y.o.   MRN: [id number]   Chief Complaint  Patient presents with   Hypothyroidism    HPI  Pt presents today for thyroid f/u. Pt currently takes synthroid 175. No current concerns. She does admit to missing a few doses of her levothyroxine at her last visit  Wt Readings from Last 3 Encounters: 03/09/21 : 193 lb 6.4 oz (87.7 kg) 12/08/20 : 190 lb 9.6 oz (86.5 kg) 08/19/20 : 191 lb 12.8 oz (87 kg)    Thyroid Problem Presents for follow-up visit. Patient reports no anxiety, fatigue, palpitations, weight gain or weight loss. The symptoms have been stable.    Past Medical History:  Diagnosis Date   Angio-edema    COPD (chronic obstructive pulmonary disease) (HCC)    denies SOB with daily activities   Full dentures    GERD (gastroesophageal reflux disease)    History of pancreatitis    Hypothyroidism    Nasal sinus congestion 05/08/2014   with sinus drainage   PVC (premature ventricular contraction) 07/07/2017   Stress incontinence    Symptomatic cholelithiasis 05/2014   Urticaria    Varicose veins of bilateral lower extremities with pain    Wears dentures    upper and lower   Wears glasses      Family History  Problem Relation Age of Onset   Hypertension Mother    Atrial fibrillation Mother    Heart attack Mother    Hypertension Sister    Hypertension Brother    Hypertension Daughter    Lung  cancer Brother    Diabetes Sister      Current Outpatient Medications:    buPROPion (WELLBUTRIN SR) 100 MG 12 hr tablet, Take 1 tablet (100 mg total) by mouth daily., Disp: 90 tablet, Rfl: 1   diphenhydrAMINE (BENADRYL) 25 MG tablet, Take 25-50 mg by mouth daily as needed for itching (allergic reaction). , Disp: , Rfl:    famotidine (PEPCID) 10 MG tablet, Take 10 mg by mouth daily., Disp: , Rfl:    ibuprofen (ADVIL) 200 MG tablet, Take 400-800 mg by mouth every 6 (six) hours as needed for headache or moderate pain., Disp: , Rfl:    levocetirizine (XYZAL) 5 MG tablet, Take 5 mg by mouth at bedtime., Disp: , Rfl:    Polyethyl Glycol-Propyl Glycol 0.4-0.3 % SOLN, Place 1 drop into both eyes 5 (five) times daily as needed (dry eyes/irriation)., Disp: , Rfl:    SYNTHROID 175 MCG tablet, TAKE 1 TABLET BY MOUTH EVERY DAY, Disp: 90 tablet, Rfl: 1   Vitamin D, Ergocalciferol, (DRISDOL) 1.25 MG (50000 UNIT) CAPS capsule, Take 1 capsule (50,000 Units total) by mouth 2 (two) times a week., Disp: 24 capsule, Rfl: 1   Allergies  Allergen Reactions   Fish Allergy Hives   Gadolinium Derivatives Hives, Itching and Rash    Pt stated she felt itching and burning in her throat immediately following the injection.   Multihance [  Gadobenate] Hives, Itching and Rash    Itching and burning in her throat. Hives and itching of her skin.   Omnipaque [Iohexol] Hives   Other Nausea And Vomiting    ALL NARCOTICS   Peanut-Containing Drug Products Hives   Shellfish Allergy Hives   Codeine Nausea And Vomiting   Hydrocodone-Acetaminophen Other (See Comments)   Oxycodone Nausea And Vomiting   Tramadol     headaches     Review of Systems  Constitutional: Negative.  Negative for fatigue, weight gain and weight loss.  Respiratory: Negative.    Cardiovascular: Negative.  Negative for chest pain, palpitations and leg swelling.  Neurological: Negative.  Negative for dizziness and headaches.  Psychiatric/Behavioral:  Negative.  The patient is not nervous/anxious.     Today's Vitals   03/09/21 1007  BP: 138/88  Pulse: 82  Temp: 97.7 F (36.5 C)  Weight: 193 lb 6.4 oz (87.7 kg)  Height: 5\' 5"  (1.651 m)  PainSc: 0-No pain   Body mass index is 32.18 kg/m.  Wt Readings from Last 3 Encounters:  03/09/21 193 lb 6.4 oz (87.7 kg)  12/08/20 190 lb 9.6 oz (86.5 kg)  08/19/20 191 lb 12.8 oz (87 kg)    Objective:  Physical Exam Vitals reviewed.  Constitutional:      General: She is not in acute distress.    Appearance: Normal appearance. She is obese.  Cardiovascular:     Rate and Rhythm: Normal rate and regular rhythm.     Pulses: Normal pulses.     Heart sounds: Normal heart sounds. No murmur heard. Pulmonary:     Effort: Pulmonary effort is normal. No respiratory distress.     Breath sounds: Normal breath sounds. No wheezing.  Neurological:     General: No focal deficit present.     Mental Status: She is alert and oriented to person, place, and time.     Cranial Nerves: No cranial nerve deficit.     Motor: No weakness.  Psychiatric:        Mood and Affect: Mood normal.        Behavior: Behavior normal.        Thought Content: Thought content normal.        Judgment: Judgment normal.        Assessment And Plan:     1. Acquired hypothyroidism Chronic, was slightly elevated at last visit - TSH - T4 - T3, free  2. BMI 30.0-30.9,adult She is encouraged to strive for BMI less than 30 to decrease cardiac risk. Advised to aim for at least 150 minutes of exercise per week.   3. Encounter for screening colonoscopy According to USPTF Colorectal cancer Screening guidelines. Colonoscopy is recommended every 10 years, starting at age 53years. Will refer to GI for colon cancer screening. - Ambulatory referral to Gastroenterology  4. Encounter for screening mammogram for malignant neoplasm of breast Pt instructed on Self Breast Exam.According to ACOG guidelines Women aged 2 and older are  recommended to get an annual mammogram. Referral sent to [facility] Pt encouraged to get annual mammogram - MM Digital Screening; Future    Patient was given opportunity to ask questions. Patient verbalized understanding of the plan and was able to repeat key elements of the plan. All questions were answered to their satisfaction.  Arnette Felts, FNP   I, Arnette Felts, FNP, have reviewed all documentation for this visit. The documentation on 03/09/21 for the exam, diagnosis, procedures, and orders are all accurate and complete.  IF YOU HAVE BEEN REFERRED TO A SPECIALIST, IT MAY TAKE 1-2 WEEKS TO SCHEDULE/PROCESS THE REFERRAL. IF YOU HAVE NOT HEARD FROM US/SPECIALIST IN TWO WEEKS, PLEASE GIVE [place] A CALL AT [phone].   THE PATIENT IS ENCOURAGED TO PRACTICE SOCIAL DISTANCING DUE TO THE COVID-19 PANDEMIC.

## 2021-03-10 ENCOUNTER — Ambulatory Visit: Payer: BC Managed Care – PPO | Admitting: Nurse Practitioner

## 2021-03-10 LAB — T4: T4, Total: 11.8 ug/dL (ref 4.5–12.0)

## 2021-03-10 LAB — TSH: TSH: 0.912 u[IU]/mL (ref 0.450–4.500)

## 2021-03-10 LAB — T3, FREE: T3, Free: 2.7 pg/mL (ref 2.0–4.4)

## 2021-03-19 ENCOUNTER — Other Ambulatory Visit: Payer: Self-pay | Admitting: Nurse Practitioner

## 2021-03-22 ENCOUNTER — Encounter: Payer: Self-pay | Admitting: Nurse Practitioner

## 2021-03-22 DIAGNOSIS — K5900 Constipation, unspecified: Secondary | ICD-10-CM | POA: Diagnosis not present

## 2021-03-22 DIAGNOSIS — Z1211 Encounter for screening for malignant neoplasm of colon: Secondary | ICD-10-CM | POA: Diagnosis not present

## 2021-03-22 DIAGNOSIS — R197 Diarrhea, unspecified: Secondary | ICD-10-CM | POA: Diagnosis not present

## 2021-04-19 DIAGNOSIS — R1013 Epigastric pain: Secondary | ICD-10-CM | POA: Diagnosis not present

## 2021-04-19 DIAGNOSIS — R1033 Periumbilical pain: Secondary | ICD-10-CM | POA: Diagnosis not present

## 2021-04-19 DIAGNOSIS — M549 Dorsalgia, unspecified: Secondary | ICD-10-CM | POA: Diagnosis not present

## 2021-04-22 ENCOUNTER — Encounter: Payer: Self-pay | Admitting: Nurse Practitioner

## 2021-04-22 DIAGNOSIS — K5730 Diverticulosis of large intestine without perforation or abscess without bleeding: Secondary | ICD-10-CM | POA: Diagnosis not present

## 2021-04-22 DIAGNOSIS — Z1211 Encounter for screening for malignant neoplasm of colon: Secondary | ICD-10-CM | POA: Diagnosis not present

## 2021-04-22 DIAGNOSIS — K635 Polyp of colon: Secondary | ICD-10-CM | POA: Diagnosis not present

## 2021-04-30 ENCOUNTER — Other Ambulatory Visit: Payer: Self-pay

## 2021-04-30 ENCOUNTER — Ambulatory Visit
Admission: RE | Admit: 2021-04-30 | Discharge: 2021-04-30 | Disposition: A | Discharge status: Home / Self Care | Payer: BC Managed Care – PPO | Source: Ambulatory Visit | Attending: Nurse Practitioner | Admitting: Nurse Practitioner

## 2021-04-30 DIAGNOSIS — Z1231 Encounter for screening mammogram for malignant neoplasm of breast: Secondary | ICD-10-CM

## 2021-05-03 ENCOUNTER — Other Ambulatory Visit: Payer: Self-pay | Admitting: Nurse Practitioner

## 2021-05-03 DIAGNOSIS — R921 Mammographic calcification found on diagnostic imaging of breast: Secondary | ICD-10-CM

## 2021-05-05 ENCOUNTER — Other Ambulatory Visit: Payer: Self-pay | Admitting: Nurse Practitioner

## 2021-05-05 DIAGNOSIS — R921 Mammographic calcification found on diagnostic imaging of breast: Secondary | ICD-10-CM

## 2021-05-13 ENCOUNTER — Other Ambulatory Visit: Payer: Self-pay

## 2021-05-13 ENCOUNTER — Ambulatory Visit (INDEPENDENT_AMBULATORY_CARE_PROVIDER_SITE_OTHER): Payer: BC Managed Care – PPO | Admitting: Podiatry

## 2021-05-13 DIAGNOSIS — M7671 Peroneal tendinitis, right leg: Secondary | ICD-10-CM | POA: Diagnosis not present

## 2021-05-13 DIAGNOSIS — Z01818 Encounter for other preprocedural examination: Secondary | ICD-10-CM

## 2021-05-14 ENCOUNTER — Telehealth: Payer: Self-pay | Admitting: Urology

## 2021-05-14 NOTE — Telephone Encounter (Signed)
DOS - 06/07/21  REPAIR PERONEAL TENDON RIGHT --- 15183 TENODESIS RIGHT --- [id number]  [employer] EFFECTIVE DATE - 08/09/19   PLAN DEDUCTIBLE - $1,000.00 W/ $0.00 REMAINING OUT OF POCKET - $6,000.00 W/ $4,113.00 REMAINING COINSURANCE - 30% COPAY - $0.00  SPOKE WITH NIKITA WITH [employer] AND SHE STATED THAT FOR CPT CODES 78978 AND [phone] NO PRIOR AUTH IS REQUIRED. REF # [id number]

## 2021-05-21 NOTE — Progress Notes (Signed)
Subjective:  Patient ID: Dana Martin, female    DOB: 1966/05/09,  MRN: [id number]  Chief Complaint  Patient presents with   Foot Pain    Right foot pain has came back     55 y.o. female presents with the above complaint.  Patient presents with follow-up of right peroneal tendinitis.  She states that it has started to hurt again and has progressively gotten worse over the last 2 weeks.  She states she was doing okay but the pain started coming back and is hurting her.  S she is ready to have surgery now as her pain has not really gotten better.  She has failed all conservative treatment options.   Review of Systems: Negative except as noted in the HPI. Denies N/V/F/Ch.  Past Medical History:  Diagnosis Date   Angio-edema    COPD (chronic obstructive pulmonary disease) (HCC)    denies SOB with daily activities   Full dentures    GERD (gastroesophageal reflux disease)    History of pancreatitis    Hypothyroidism    Nasal sinus congestion 05/08/2014   with sinus drainage   PVC (premature ventricular contraction) 07/07/2017   Stress incontinence    Symptomatic cholelithiasis 05/2014   Urticaria    Varicose veins of bilateral lower extremities with pain    Wears dentures    upper and lower   Wears glasses     Current Outpatient Medications:    buPROPion (WELLBUTRIN SR) 100 MG 12 hr tablet, Take 1 tablet (100 mg total) by mouth daily., Disp: 90 tablet, Rfl: 1   diphenhydrAMINE (BENADRYL) 25 MG tablet, Take 25-50 mg by mouth daily as needed for itching (allergic reaction). , Disp: , Rfl:    famotidine (PEPCID) 10 MG tablet, Take 10 mg by mouth daily., Disp: , Rfl:    ibuprofen (ADVIL) 200 MG tablet, Take 400-800 mg by mouth every 6 (six) hours as needed for headache or moderate pain., Disp: , Rfl:    levocetirizine (XYZAL) 5 MG tablet, Take 5 mg by mouth at bedtime., Disp: , Rfl:    Polyethyl Glycol-Propyl Glycol 0.4-0.3 % SOLN, Place 1 drop into both eyes 5 (five) times daily as  needed (dry eyes/irriation)., Disp: , Rfl:    SYNTHROID 175 MCG tablet, TAKE 1 TABLET BY MOUTH EVERY DAY, Disp: 90 tablet, Rfl: 1   Vitamin D, Ergocalciferol, (DRISDOL) 1.25 MG (50000 UNIT) CAPS capsule, Take 1 capsule (50,000 Units total) by mouth 2 (two) times a week., Disp: 24 capsule, Rfl: 1  Social History   Tobacco Use  Smoking Status Every Day   Packs/day: 1.00   Years: 25.00   Pack years: 25.00   Types: Cigarettes  Smokeless Tobacco Never  Tobacco Comments   quit for six years, started back 09-2018 - currently on wellbutrin for smoking cessation    Allergies  Allergen Reactions   Fish Allergy Hives   Gadolinium Derivatives Hives, Itching and Rash    Pt stated she felt itching and burning in her throat immediately following the injection.   Multihance [Gadobenate] Hives, Itching and Rash    Itching and burning in her throat. Hives and itching of her skin.   Omnipaque [Iohexol] Hives   Other Nausea And Vomiting    ALL NARCOTICS   Peanut-Containing Drug Products Hives   Shellfish Allergy Hives   Codeine Nausea And Vomiting   Hydrocodone-Acetaminophen Other (See Comments)   Oxycodone Nausea And Vomiting   Tramadol     headaches   Objective:  There were no vitals filed for this visit. There is no height or weight on file to calculate BMI. Constitutional Well developed. Well nourished.  Vascular Dorsalis pedis pulses palpable bilaterally. Posterior tibial pulses palpable bilaterally. Capillary refill normal to all digits.  No cyanosis or clubbing noted. Pedal hair growth normal.  Neurologic Normal speech. Oriented to person, place, and time. Epicritic sensation to light touch grossly present bilaterally.  Dermatologic Nails well groomed and normal in appearance. No open wounds. No skin lesions.  Orthopedic: Right foot exam: Pain on palpation along the course of the peroneal tendon. Mild pain at the insertion of the peroneal tendon. Pain with resisted dorsiflexion  eversion of the foot. No pain at the posterior tibial tendon, ATFL ligament. Mild pain at the Achilles tendon. No pain at the ankle joint range of motion. No deep intra-articular ankle joint pain noted.   Radiographs: None. Patient already has x-rays done recently at outside facility  IMPRESSION: 1. Tendinosis with extensive long segment longitudinal split tear of the peroneus longus tendon. 2. Prominent bone marrow edema within the lateral aspect of the calcaneal body adjacent to the peroneal tubercle, likely reactive. 3. Pes cavovarus alignment. 4. Moderate arthropathy of the calcaneocuboid joint. 5. Findings suggest sequela of remote ATFL injury.   Assessment:   1. Peroneal tendinitis, right   2. Preoperative examination     Plan:  Patient was evaluated and treated and all questions answered.  Right peroneal tendinitis with underlying tearing -I explained the patient the etiology of peroneal tendinitis and various treatment options were discussed.  At this time patient is pain has clinically returned and is causing her a lot of pain especially while driving.  Given the amount of pain that she is having in the setting of failing all conservative treatment options I discussed with her that she would benefit from surgical intervention with primary repair of the peroneal tendon versus peroneal tenodesis.  I discussed my preoperative intraoperative and postoperative plan in extensive detail.  I discussed that she will need to be nonweightbearing for 4 to 6 weeks followed by weightbearing as tolerated in cam boot.  She states understanding will do so. -Informed surgical risk consent was reviewed and read aloud to the patient.  I reviewed the films.  I have discussed my findings with the patient in great detail.  I have discussed all risks including but not limited to infection, stiffness, scarring, limp, disability, deformity, damage to blood vessels and nerves, numbness, poor healing, need for  braces, arthritis, chronic pain, amputation, death.  All benefits and realistic expectations discussed in great detail.  I have made no promises as to the outcome.  I have provided realistic expectations.  I have offered the patient a 2nd opinion, which they have declined and assured me they preferred to proceed despite the risks   Pes cavus -I explained the patient the etiology of pes cavus and various treatment options were extensively discussed. Given that she has a high arch foot structure putting a lot of stress on the peroneal tendon I believe she will benefit from custom-made orthotics to help control the hindfoot motion support the arch of the foot and take the stress away from the peroneal tendon. -Orthotics have been dispensed and is functioning well without acute complaints.  No follow-ups on file.

## 2021-06-02 ENCOUNTER — Other Ambulatory Visit: Payer: Self-pay

## 2021-06-02 ENCOUNTER — Ambulatory Visit
Admission: RE | Admit: 2021-06-02 | Discharge: 2021-06-02 | Disposition: A | Discharge status: Home / Self Care | Payer: BC Managed Care – PPO | Source: Ambulatory Visit | Attending: Nurse Practitioner | Admitting: Nurse Practitioner

## 2021-06-02 DIAGNOSIS — R921 Mammographic calcification found on diagnostic imaging of breast: Secondary | ICD-10-CM | POA: Diagnosis not present

## 2021-06-02 DIAGNOSIS — R922 Inconclusive mammogram: Secondary | ICD-10-CM | POA: Diagnosis not present

## 2021-06-07 ENCOUNTER — Other Ambulatory Visit: Payer: Self-pay | Admitting: Podiatry

## 2021-06-07 DIAGNOSIS — Y929 Unspecified place or not applicable: Secondary | ICD-10-CM | POA: Diagnosis not present

## 2021-06-07 DIAGNOSIS — M7671 Peroneal tendinitis, right leg: Secondary | ICD-10-CM | POA: Diagnosis not present

## 2021-06-07 DIAGNOSIS — M25571 Pain in right ankle and joints of right foot: Secondary | ICD-10-CM | POA: Diagnosis not present

## 2021-06-07 DIAGNOSIS — X58XXXA Exposure to other specified factors, initial encounter: Secondary | ICD-10-CM | POA: Diagnosis not present

## 2021-06-07 DIAGNOSIS — S86311A Strain of muscle(s) and tendon(s) of peroneal muscle group at lower leg level, right leg, initial encounter: Secondary | ICD-10-CM | POA: Diagnosis not present

## 2021-06-07 MED ORDER — IBUPROFEN 800 MG PO TABS: 800.0000 mg | ORAL_TABLET | Freq: Four times a day (QID) | ORAL | 1 refills | Status: DC | PRN

## 2021-06-07 MED ORDER — OXYCODONE-ACETAMINOPHEN 5-325 MG PO TABS: 1.0000 | ORAL_TABLET | ORAL | 0 refills | Status: DC | PRN

## 2021-06-15 ENCOUNTER — Other Ambulatory Visit: Payer: Self-pay

## 2021-06-15 ENCOUNTER — Ambulatory Visit (INDEPENDENT_AMBULATORY_CARE_PROVIDER_SITE_OTHER): Payer: BC Managed Care – PPO | Admitting: Podiatry

## 2021-06-15 ENCOUNTER — Encounter: Payer: Self-pay | Admitting: Podiatry

## 2021-06-15 DIAGNOSIS — Z9889 Other specified postprocedural states: Secondary | ICD-10-CM

## 2021-06-15 DIAGNOSIS — M7671 Peroneal tendinitis, right leg: Secondary | ICD-10-CM

## 2021-06-15 NOTE — Progress Notes (Signed)
Subjective:  Patient ID: Dana Martin, female    DOB: 02/14/1966,  MRN: [id number]  Chief Complaint  Patient presents with   Routine Post Op    POV #1 DOS 06/07/2021 RT PERONEAL TENDON REPAIR W/POSS TENODESIS    DOS: 06/07/2021 Procedure: Right peroneal brevis tendon repair with tenodesis  56 y.o. female returns for post-op check.  Patient states she is doing well.  Very minimal pain.  She has been nonweightbearing to the right lower extremity.  She states bandages clean dry and intact.  She has been wearing her boot.  No other acute complaints.  Review of Systems: Negative except as noted in the HPI. Denies N/V/F/Ch.  Past Medical History:  Diagnosis Date   Angio-edema    COPD (chronic obstructive pulmonary disease) (HCC)    denies SOB with daily activities   Full dentures    GERD (gastroesophageal reflux disease)    History of pancreatitis    Hypothyroidism    Nasal sinus congestion 05/08/2014   with sinus drainage   PVC (premature ventricular contraction) 07/07/2017   Stress incontinence    Symptomatic cholelithiasis 05/2014   Urticaria    Varicose veins of bilateral lower extremities with pain    Wears dentures    upper and lower   Wears glasses     Current Outpatient Medications:    buPROPion (WELLBUTRIN SR) 100 MG 12 hr tablet, Take 1 tablet (100 mg total) by mouth daily., Disp: 90 tablet, Rfl: 1   diphenhydrAMINE (BENADRYL) 25 MG tablet, Take 25-50 mg by mouth daily as needed for itching (allergic reaction). , Disp: , Rfl:    famotidine (PEPCID) 10 MG tablet, Take 10 mg by mouth daily., Disp: , Rfl:    ibuprofen (ADVIL) 200 MG tablet, Take 400-800 mg by mouth every 6 (six) hours as needed for headache or moderate pain., Disp: , Rfl:    ibuprofen (ADVIL) 800 MG tablet, Take 1 tablet (800 mg total) by mouth every 6 (six) hours as needed., Disp: 60 tablet, Rfl: 1   levocetirizine (XYZAL) 5 MG tablet, Take 5 mg by mouth at bedtime., Disp: , Rfl:     oxyCODONE-acetaminophen (PERCOCET) 5-325 MG tablet, Take 1 tablet by mouth every 4 (four) hours as needed for severe pain., Disp: 30 tablet, Rfl: 0   Polyethyl Glycol-Propyl Glycol 0.4-0.3 % SOLN, Place 1 drop into both eyes 5 (five) times daily as needed (dry eyes/irriation)., Disp: , Rfl:    SYNTHROID 175 MCG tablet, TAKE 1 TABLET BY MOUTH EVERY DAY, Disp: 90 tablet, Rfl: 1   Vitamin D, Ergocalciferol, (DRISDOL) 1.25 MG (50000 UNIT) CAPS capsule, Take 1 capsule (50,000 Units total) by mouth 2 (two) times a week., Disp: 24 capsule, Rfl: 1  Social History   Tobacco Use  Smoking Status Every Day   Packs/day: 1.00   Years: 25.00   Pack years: 25.00   Types: Cigarettes  Smokeless Tobacco Never  Tobacco Comments   quit for six years, started back 09-2018 - currently on wellbutrin for smoking cessation    Allergies  Allergen Reactions   Fish Allergy Hives   Gadolinium Derivatives Hives, Itching and Rash    Pt stated she felt itching and burning in her throat immediately following the injection.   Multihance [Gadobenate] Hives, Itching and Rash    Itching and burning in her throat. Hives and itching of her skin.   Omnipaque [Iohexol] Hives   Other Nausea And Vomiting    ALL NARCOTICS   Peanut-Containing Drug Products  Hives   Shellfish Allergy Hives   Codeine Nausea And Vomiting   Hydrocodone-Acetaminophen Other (See Comments)   Oxycodone Nausea And Vomiting   Tramadol     headaches   Objective:  There were no vitals filed for this visit. There is no height or weight on file to calculate BMI. Constitutional Well developed. Well nourished.  Vascular Foot warm and well perfused. Capillary refill normal to all digits.   Neurologic Normal speech. Oriented to person, place, and time. Epicritic sensation to light touch grossly present bilaterally.  Dermatologic Skin healing well without signs of infection. Skin edges well coapted without signs of infection.  Orthopedic: Tenderness  to palpation noted about the surgical site.   Radiographs: None Assessment:   1. Peroneal tendinitis, right   2. Status post foot surgery    Plan:  Patient was evaluated and treated and all questions answered.  S/p foot surgery right -Progressing as expected post-operatively. -XR: None -WB Status: Nonweightbearing to the right lower extremity -Sutures: Intact.  No clinical signs of dehiscence noted no complication noted. -Medications: None -Foot redressed.  No follow-ups on file.

## 2021-06-22 ENCOUNTER — Encounter: Payer: BC Managed Care – PPO | Admitting: Podiatry

## 2021-06-25 DIAGNOSIS — M79676 Pain in unspecified toe(s): Secondary | ICD-10-CM

## 2021-06-29 ENCOUNTER — Other Ambulatory Visit: Payer: Self-pay

## 2021-06-29 ENCOUNTER — Ambulatory Visit (INDEPENDENT_AMBULATORY_CARE_PROVIDER_SITE_OTHER): Payer: BC Managed Care – PPO | Admitting: Podiatry

## 2021-06-29 ENCOUNTER — Encounter: Payer: BC Managed Care – PPO | Admitting: Podiatry

## 2021-06-29 DIAGNOSIS — Z9889 Other specified postprocedural states: Secondary | ICD-10-CM

## 2021-06-29 DIAGNOSIS — M7671 Peroneal tendinitis, right leg: Secondary | ICD-10-CM

## 2021-06-29 NOTE — Progress Notes (Signed)
Subjective:  Patient ID: Dana Martin, female    DOB: 06/25/1966,  MRN: [id number]  Chief Complaint  Patient presents with   Routine Post Op    POV #2 DOS 06/07/2021 RT PERONEAL TENDON REPAIR W/POSS TENODESIS    DOS: 06/07/2021 Procedure: Right peroneal brevis tendon repair with tenodesis  55 y.o. female returns for post-op check.  Patient states she is doing well.  Very minimal pain.  She has been nonweightbearing to the right lower extremity.  She states bandages clean dry and intact.  She has been wearing her boot.  No other acute complaints.  Review of Systems: Negative except as noted in the HPI. Denies N/V/F/Ch.  Past Medical History:  Diagnosis Date   Angio-edema    COPD (chronic obstructive pulmonary disease) (HCC)    denies SOB with daily activities   Full dentures    GERD (gastroesophageal reflux disease)    History of pancreatitis    Hypothyroidism    Nasal sinus congestion 05/08/2014   with sinus drainage   PVC (premature ventricular contraction) 07/07/2017   Stress incontinence    Symptomatic cholelithiasis 05/2014   Urticaria    Varicose veins of bilateral lower extremities with pain    Wears dentures    upper and lower   Wears glasses     Current Outpatient Medications:    buPROPion (WELLBUTRIN SR) 100 MG 12 hr tablet, Take 1 tablet (100 mg total) by mouth daily., Disp: 90 tablet, Rfl: 1   diphenhydrAMINE (BENADRYL) 25 MG tablet, Take 25-50 mg by mouth daily as needed for itching (allergic reaction). , Disp: , Rfl:    famotidine (PEPCID) 10 MG tablet, Take 10 mg by mouth daily., Disp: , Rfl:    ibuprofen (ADVIL) 200 MG tablet, Take 400-800 mg by mouth every 6 (six) hours as needed for headache or moderate pain., Disp: , Rfl:    ibuprofen (ADVIL) 800 MG tablet, Take 1 tablet (800 mg total) by mouth every 6 (six) hours as needed., Disp: 60 tablet, Rfl: 1   levocetirizine (XYZAL) 5 MG tablet, Take 5 mg by mouth at bedtime., Disp: , Rfl:     oxyCODONE-acetaminophen (PERCOCET) 5-325 MG tablet, Take 1 tablet by mouth every 4 (four) hours as needed for severe pain., Disp: 30 tablet, Rfl: 0   Polyethyl Glycol-Propyl Glycol 0.4-0.3 % SOLN, Place 1 drop into both eyes 5 (five) times daily as needed (dry eyes/irriation)., Disp: , Rfl:    SYNTHROID 175 MCG tablet, TAKE 1 TABLET BY MOUTH EVERY DAY, Disp: 90 tablet, Rfl: 1   Vitamin D, Ergocalciferol, (DRISDOL) 1.25 MG (50000 UNIT) CAPS capsule, Take 1 capsule (50,000 Units total) by mouth 2 (two) times a week., Disp: 24 capsule, Rfl: 1  Social History   Tobacco Use  Smoking Status Every Day   Packs/day: 1.00   Years: 25.00   Pack years: 25.00   Types: Cigarettes  Smokeless Tobacco Never  Tobacco Comments   quit for six years, started back 09-2018 - currently on wellbutrin for smoking cessation    Allergies  Allergen Reactions   Fish Allergy Hives   Gadolinium Derivatives Hives, Itching and Rash    Pt stated she felt itching and burning in her throat immediately following the injection.   Multihance [Gadobenate] Hives, Itching and Rash    Itching and burning in her throat. Hives and itching of her skin.   Omnipaque [Iohexol] Hives   Other Nausea And Vomiting    ALL NARCOTICS   Peanut-Containing Drug Products  Hives   Shellfish Allergy Hives   Codeine Nausea And Vomiting   Hydrocodone-Acetaminophen Other (See Comments)   Oxycodone Nausea And Vomiting   Tramadol     headaches   Objective:  There were no vitals filed for this visit. There is no height or weight on file to calculate BMI. Constitutional Well developed. Well nourished.  Vascular Foot warm and well perfused. Capillary refill normal to all digits.   Neurologic Normal speech. Oriented to person, place, and time. Epicritic sensation to light touch grossly present bilaterally.  Dermatologic Skin has completely epithelialized.  Good correction alignment noted.  Muscular strength of peroneal tendon at about 60%.   Intact eversion dorsiflexion of the foot noted with resistance  Orthopedic: Tenderness to palpation noted about the surgical site.   Radiographs: None Assessment:   1. Peroneal tendinitis, right   2. Status post foot surgery     Plan:  Patient was evaluated and treated and all questions answered.  S/p foot surgery right -Progressing as expected post-operatively. -XR: None -WB Status: Begin weightbearing as tolerated to the right lower extremity in cam boot. -Sutures: Removed no clinical signs of dehiscence noted no complication noted. -Medications: None -Prescription for physical therapy was given.  No follow-ups on file.

## 2021-07-06 ENCOUNTER — Encounter: Payer: BC Managed Care – PPO | Admitting: Podiatry

## 2021-07-08 ENCOUNTER — Ambulatory Visit: Payer: BC Managed Care – PPO | Admitting: Nurse Practitioner

## 2021-07-08 ENCOUNTER — Encounter: Payer: Self-pay | Admitting: Nurse Practitioner

## 2021-07-08 ENCOUNTER — Other Ambulatory Visit: Payer: Self-pay

## 2021-07-08 VITALS — BP 124/70 | HR 96 | Temp 98.6°F | Ht 65.0 in | Wt 198.2 lb

## 2021-07-08 DIAGNOSIS — Z6832 Body mass index (BMI) 32.0-32.9, adult: Secondary | ICD-10-CM

## 2021-07-08 DIAGNOSIS — M7671 Peroneal tendinitis, right leg: Secondary | ICD-10-CM

## 2021-07-08 DIAGNOSIS — M25571 Pain in right ankle and joints of right foot: Secondary | ICD-10-CM | POA: Diagnosis not present

## 2021-07-08 DIAGNOSIS — E039 Hypothyroidism, unspecified: Secondary | ICD-10-CM | POA: Diagnosis not present

## 2021-07-08 DIAGNOSIS — E6609 Other obesity due to excess calories: Secondary | ICD-10-CM | POA: Diagnosis not present

## 2021-07-08 DIAGNOSIS — R2689 Other abnormalities of gait and mobility: Secondary | ICD-10-CM | POA: Diagnosis not present

## 2021-07-08 DIAGNOSIS — Z2821 Immunization not carried out because of patient refusal: Secondary | ICD-10-CM

## 2021-07-08 NOTE — Patient Instructions (Signed)

## 2021-07-08 NOTE — Progress Notes (Signed)
I,Katawbba Wiggins,acting as a Neurosurgeon for [employer], FNP.,have documented all relevant documentation on the behalf of Arnette Felts, FNP,as directed by  Arnette Felts, FNP while in the presence of Arnette Felts, FNP.  This visit occurred during the SARS-CoV-2 public health emergency.  Safety protocols were in place, including screening questions prior to the visit, additional usage of staff PPE, and extensive cleaning of exam room while observing appropriate contact time as indicated for disinfecting solutions.  Subjective:     Patient ID: Dana Martin , female    DOB: Feb 09, 1966 , 55 y.o.   MRN: [id number]   Chief Complaint  Patient presents with   Hypothyroidism     HPI  Pt presents today for thyroid f/u.   She had surgery on her right foot on Oct 31st, she starts PT today and is out of work  [employer] from Last 3 Encounters: 07/08/21 : 198 lb 3.2 oz (89.9 kg) 03/09/21 : 193 lb 6.4 oz (87.7 kg) 12/08/20 : 190 lb 9.6 oz (86.5 kg)      Thyroid Problem Presents for follow-up visit. Patient reports no anxiety, fatigue, palpitations, weight gain or weight loss. The symptoms have been stable.    Past Medical History:  Diagnosis Date   Angio-edema    COPD (chronic obstructive pulmonary disease) (HCC)    denies SOB with daily activities   Full dentures    GERD (gastroesophageal reflux disease)    History of pancreatitis    Hypothyroidism    Nasal sinus congestion 05/08/2014   with sinus drainage   PVC (premature ventricular contraction) 07/07/2017   Stress incontinence    Symptomatic cholelithiasis 05/2014   Urticaria    Varicose veins of bilateral lower extremities with pain    Wears dentures    upper and lower   Wears glasses      Family History  Problem Relation Age of Onset   Hypertension Mother    Atrial fibrillation Mother    Heart attack Mother    Hypertension Sister    Hypertension Brother    Hypertension Daughter    Lung cancer Brother    Diabetes  Sister      Current Outpatient Medications:    buPROPion (WELLBUTRIN SR) 100 MG 12 hr tablet, Take 1 tablet (100 mg total) by mouth daily., Disp: 90 tablet, Rfl: 1   Cholecalciferol (VITAMIN D3) 25 MCG (1000 UT) CAPS, Take by mouth. daily, Disp: , Rfl:    diphenhydrAMINE (BENADRYL) 25 MG tablet, Take 25-50 mg by mouth daily as needed for itching (allergic reaction). , Disp: , Rfl:    famotidine (PEPCID) 10 MG tablet, Take 10 mg by mouth daily., Disp: , Rfl:    ibuprofen (ADVIL) 800 MG tablet, Take 1 tablet (800 mg total) by mouth every 6 (six) hours as needed., Disp: 60 tablet, Rfl: 1   levocetirizine (XYZAL) 5 MG tablet, Take 5 mg by mouth at bedtime., Disp: , Rfl:    Polyethyl Glycol-Propyl Glycol 0.4-0.3 % SOLN, Place 1 drop into both eyes 5 (five) times daily as needed (dry eyes/irriation)., Disp: , Rfl:    SYNTHROID 175 MCG tablet, TAKE 1 TABLET BY MOUTH EVERY DAY, Disp: 90 tablet, Rfl: 1   ibuprofen (ADVIL) 200 MG tablet, Take 400-800 mg by mouth every 6 (six) hours as needed for headache or moderate pain. (Patient not taking: Reported on 07/08/2021), Disp: , Rfl:    Vitamin D, Ergocalciferol, (DRISDOL) 1.25 MG (50000 UNIT) CAPS capsule, Take 1 capsule (50,000 Units total)  by mouth 2 (two) times a week. (Patient not taking: Reported on 07/08/2021), Disp: 24 capsule, Rfl: 1   Allergies  Allergen Reactions   Fish Allergy Hives   Gadolinium Derivatives Hives, Itching and Rash    Pt stated she felt itching and burning in her throat immediately following the injection.   Multihance [Gadobenate] Hives, Itching and Rash    Itching and burning in her throat. Hives and itching of her skin.   Omnipaque [Iohexol] Hives   Other Nausea And Vomiting    ALL NARCOTICS   Peanut-Containing Drug Products Hives   Shellfish Allergy Hives   Codeine Nausea And Vomiting   Hydrocodone-Acetaminophen Other (See Comments)   Oxycodone Nausea And Vomiting   Tramadol     headaches     Review of Systems   Constitutional: Negative.  Negative for fatigue, weight gain and weight loss.  Respiratory: Negative.    Cardiovascular: Negative.  Negative for palpitations.  Gastrointestinal: Negative.   Psychiatric/Behavioral: Negative.  The patient is not nervous/anxious.   All other systems reviewed and are negative.   Today's Vitals   07/08/21 1001  BP: 124/70  Pulse: 96  Temp: 98.6 F (37 C)  Weight: 198 lb 3.2 oz (89.9 kg)  Height: 5\' 5"  (1.651 m)   Body mass index is 32.98 kg/m.  Wt Readings from Last 3 Encounters:  07/08/21 198 lb 3.2 oz (89.9 kg)  03/09/21 193 lb 6.4 oz (87.7 kg)  12/08/20 190 lb 9.6 oz (86.5 kg)    BP Readings from Last 3 Encounters:  07/08/21 124/70  03/09/21 138/88  12/08/20 112/78    Objective:  Physical Exam Vitals reviewed.  Constitutional:      General: She is not in acute distress.    Appearance: Normal appearance. She is obese.  Cardiovascular:     Rate and Rhythm: Normal rate and regular rhythm.     Pulses: Normal pulses.     Heart sounds: Normal heart sounds. No murmur heard. Pulmonary:     Effort: Pulmonary effort is normal. No respiratory distress.     Breath sounds: Normal breath sounds. No wheezing.  Musculoskeletal:     Comments: Limited range of motion to right lower extremity post surgery, has walking boot on  Neurological:     General: No focal deficit present.     Mental Status: She is alert and oriented to person, place, and time.     Cranial Nerves: No cranial nerve deficit.     Motor: No weakness.  Psychiatric:        Mood and Affect: Mood normal.        Behavior: Behavior normal.        Thought Content: Thought content normal.        Judgment: Judgment normal.        Assessment And Plan:     1. Acquired hypothyroidism Comments: Well controlled, will make changes to medications pending results. If normal return in 6 months - TSH - T4 - T3, free  2. Peroneal tendinitis of right lower extremity Comments: Currently  starting PT today and will tentatively returning to work on Dec 26th.   3. Influenza vaccination declined Patient declined influenza vaccination at this time. Patient is aware that influenza vaccine prevents illness in 70% of healthy people, and reduces hospitalizations to 30-70% in elderly. This vaccine is recommended annually. Pt is willing to accept risk associated with refusing vaccination.   4. Herpes zoster vaccination declined  5. Pneumococcal vaccination declined  6.  Class 1 obesity due to excess calories with serious comorbidity and body mass index (BMI) of 32.0 to 32.9 in adult  She is encouraged to strive for BMI less than 30 to decrease cardiac risk. Advised to aim for at least 150 minutes of exercise per week.   Patient was given opportunity to ask questions. Patient verbalized understanding of the plan and was able to repeat key elements of the plan. All questions were answered to their satisfaction.  Arnette Felts, FNP   I, Arnette Felts, FNP, have reviewed all documentation for this visit. The documentation on 07/08/21 for the exam, diagnosis, procedures, and orders are all accurate and complete.   IF YOU HAVE BEEN REFERRED TO A SPECIALIST, IT MAY TAKE 1-2 WEEKS TO SCHEDULE/PROCESS THE REFERRAL. IF YOU HAVE NOT HEARD FROM US/SPECIALIST IN TWO WEEKS, PLEASE GIVE [place] A CALL AT [phone].   THE PATIENT IS ENCOURAGED TO PRACTICE SOCIAL DISTANCING DUE TO THE COVID-19 PANDEMIC.

## 2021-07-09 LAB — T3, FREE: T3, Free: 2.3 pg/mL (ref 2.0–4.4)

## 2021-07-09 LAB — T4: T4, Total: 11.5 ug/dL (ref 4.5–12.0)

## 2021-07-09 LAB — TSH: TSH: 1.88 u[IU]/mL (ref 0.450–4.500)

## 2021-07-14 DIAGNOSIS — R2689 Other abnormalities of gait and mobility: Secondary | ICD-10-CM | POA: Diagnosis not present

## 2021-07-14 DIAGNOSIS — M25571 Pain in right ankle and joints of right foot: Secondary | ICD-10-CM | POA: Diagnosis not present

## 2021-07-16 DIAGNOSIS — M25571 Pain in right ankle and joints of right foot: Secondary | ICD-10-CM | POA: Diagnosis not present

## 2021-07-16 DIAGNOSIS — R2689 Other abnormalities of gait and mobility: Secondary | ICD-10-CM | POA: Diagnosis not present

## 2021-07-19 ENCOUNTER — Telehealth: Payer: Self-pay

## 2021-07-19 DIAGNOSIS — R2689 Other abnormalities of gait and mobility: Secondary | ICD-10-CM | POA: Diagnosis not present

## 2021-07-19 DIAGNOSIS — M25571 Pain in right ankle and joints of right foot: Secondary | ICD-10-CM | POA: Diagnosis not present

## 2021-07-19 NOTE — Telephone Encounter (Signed)
Annice Pih with [facility] called. Physical therapist needs to know when patient can start WB exercises without the CAM boot. Please fax specific PT orders. Thanks  [phone]

## 2021-07-22 DIAGNOSIS — R2689 Other abnormalities of gait and mobility: Secondary | ICD-10-CM | POA: Diagnosis not present

## 2021-07-22 DIAGNOSIS — M25571 Pain in right ankle and joints of right foot: Secondary | ICD-10-CM | POA: Diagnosis not present

## 2021-07-26 DIAGNOSIS — M25571 Pain in right ankle and joints of right foot: Secondary | ICD-10-CM | POA: Diagnosis not present

## 2021-07-26 DIAGNOSIS — R2689 Other abnormalities of gait and mobility: Secondary | ICD-10-CM | POA: Diagnosis not present

## 2021-07-27 ENCOUNTER — Ambulatory Visit (INDEPENDENT_AMBULATORY_CARE_PROVIDER_SITE_OTHER): Payer: BC Managed Care – PPO | Admitting: Podiatry

## 2021-07-27 ENCOUNTER — Other Ambulatory Visit: Payer: Self-pay

## 2021-07-27 DIAGNOSIS — M7671 Peroneal tendinitis, right leg: Secondary | ICD-10-CM

## 2021-07-27 DIAGNOSIS — Z9889 Other specified postprocedural states: Secondary | ICD-10-CM

## 2021-07-27 NOTE — Progress Notes (Signed)
Subjective:  Patient ID: Dana Martin, female    DOB: 02/22/1966,  MRN: [id number]  Chief Complaint  Patient presents with   Routine Post Op    POV #2 DOS 06/07/2021 RT PERONEAL TENDON REPAIR W/POSS TENODESIS    DOS: 06/07/2021 Procedure: Right peroneal brevis tendon repair with tenodesis  55 y.o. female returns for post-op check.  Patient states she is doing well.  Very minimal pain.  She has been nonweightbearing to the right lower extremity.  She states bandages clean dry and intact.  She has been wearing her boot.  No other acute complaints.  Review of Systems: Negative except as noted in the HPI. Denies N/V/F/Ch.  Past Medical History:  Diagnosis Date   Angio-edema    COPD (chronic obstructive pulmonary disease) (HCC)    denies SOB with daily activities   Full dentures    GERD (gastroesophageal reflux disease)    History of pancreatitis    Hypothyroidism    Nasal sinus congestion 05/08/2014   with sinus drainage   PVC (premature ventricular contraction) 07/07/2017   Stress incontinence    Symptomatic cholelithiasis 05/2014   Urticaria    Varicose veins of bilateral lower extremities with pain    Wears dentures    upper and lower   Wears glasses     Current Outpatient Medications:    buPROPion (WELLBUTRIN SR) 100 MG 12 hr tablet, Take 1 tablet (100 mg total) by mouth daily., Disp: 90 tablet, Rfl: 1   Cholecalciferol (VITAMIN D3) 25 MCG (1000 UT) CAPS, Take by mouth. daily, Disp: , Rfl:    diphenhydrAMINE (BENADRYL) 25 MG tablet, Take 25-50 mg by mouth daily as needed for itching (allergic reaction). , Disp: , Rfl:    famotidine (PEPCID) 10 MG tablet, Take 10 mg by mouth daily., Disp: , Rfl:    ibuprofen (ADVIL) 200 MG tablet, Take 400-800 mg by mouth every 6 (six) hours as needed for headache or moderate pain. (Patient not taking: Reported on 07/08/2021), Disp: , Rfl:    ibuprofen (ADVIL) 800 MG tablet, Take 1 tablet (800 mg total) by mouth every 6 (six) hours as  needed., Disp: 60 tablet, Rfl: 1   levocetirizine (XYZAL) 5 MG tablet, Take 5 mg by mouth at bedtime., Disp: , Rfl:    Polyethyl Glycol-Propyl Glycol 0.4-0.3 % SOLN, Place 1 drop into both eyes 5 (five) times daily as needed (dry eyes/irriation)., Disp: , Rfl:    SYNTHROID 175 MCG tablet, TAKE 1 TABLET BY MOUTH EVERY DAY, Disp: 90 tablet, Rfl: 1   Vitamin D, Ergocalciferol, (DRISDOL) 1.25 MG (50000 UNIT) CAPS capsule, Take 1 capsule (50,000 Units total) by mouth 2 (two) times a week. (Patient not taking: Reported on 07/08/2021), Disp: 24 capsule, Rfl: 1  Social History   Tobacco Use  Smoking Status Every Day   Packs/day: 1.00   Years: 25.00   Pack years: 25.00   Types: Cigarettes  Smokeless Tobacco Never  Tobacco Comments   quit for six years, started back 09-2018 - currently on wellbutrin for smoking cessation    Allergies  Allergen Reactions   Fish Allergy Hives   Gadolinium Derivatives Hives, Itching and Rash    Pt stated she felt itching and burning in her throat immediately following the injection.   Multihance [Gadobenate] Hives, Itching and Rash    Itching and burning in her throat. Hives and itching of her skin.   Omnipaque [Iohexol] Hives   Other Nausea And Vomiting    ALL NARCOTICS  Peanut-Containing Drug Products Hives   Shellfish Allergy Hives   Codeine Nausea And Vomiting   Hydrocodone-Acetaminophen Other (See Comments)   Oxycodone Nausea And Vomiting   Tramadol     headaches   Objective:  There were no vitals filed for this visit. There is no height or weight on file to calculate BMI. Constitutional Well developed. Well nourished.  Vascular Foot warm and well perfused. Capillary refill normal to all digits.   Neurologic Normal speech. Oriented to person, place, and time. Epicritic sensation to light touch grossly present bilaterally.  Dermatologic Skin has completely epithelialized.  Good correction alignment noted.  Muscular strength of peroneal tendon at  about 70 %.  Intact eversion dorsiflexion of the foot noted with resistance  Orthopedic: Tenderness to palpation noted about the surgical site.   Radiographs: None Assessment:   1. Peroneal tendinitis, right   2. Status post foot surgery      Plan:  Patient was evaluated and treated and all questions answered.  S/p foot surgery right -Progressing as expected post-operatively. -XR: None -WB Status: Begin weightbearing as tolerated in regular shoes -Sutures: Removed no clinical signs of dehiscence noted no complication noted. -Medications: None -Continue physical therapy -We will aim to begin patient return back to work January 15.  She states understanding  No follow-ups on file.

## 2021-07-29 DIAGNOSIS — M25571 Pain in right ankle and joints of right foot: Secondary | ICD-10-CM | POA: Diagnosis not present

## 2021-07-29 DIAGNOSIS — R2689 Other abnormalities of gait and mobility: Secondary | ICD-10-CM | POA: Diagnosis not present

## 2021-08-03 DIAGNOSIS — M25571 Pain in right ankle and joints of right foot: Secondary | ICD-10-CM | POA: Diagnosis not present

## 2021-08-03 DIAGNOSIS — R2689 Other abnormalities of gait and mobility: Secondary | ICD-10-CM | POA: Diagnosis not present

## 2021-08-09 DIAGNOSIS — U071 COVID-19: Secondary | ICD-10-CM | POA: Diagnosis not present

## 2021-08-09 DIAGNOSIS — J9801 Acute bronchospasm: Secondary | ICD-10-CM | POA: Diagnosis not present

## 2021-08-09 DIAGNOSIS — J069 Acute upper respiratory infection, unspecified: Secondary | ICD-10-CM | POA: Diagnosis not present

## 2021-08-09 DIAGNOSIS — Z20822 Contact with and (suspected) exposure to covid-19: Secondary | ICD-10-CM | POA: Diagnosis not present

## 2021-08-10 ENCOUNTER — Ambulatory Visit: Payer: BC Managed Care – PPO | Admitting: Podiatry

## 2021-08-16 DIAGNOSIS — M25571 Pain in right ankle and joints of right foot: Secondary | ICD-10-CM | POA: Diagnosis not present

## 2021-08-16 DIAGNOSIS — R2689 Other abnormalities of gait and mobility: Secondary | ICD-10-CM | POA: Diagnosis not present

## 2021-08-17 ENCOUNTER — Ambulatory Visit (INDEPENDENT_AMBULATORY_CARE_PROVIDER_SITE_OTHER): Payer: BC Managed Care – PPO | Admitting: Podiatry

## 2021-08-17 ENCOUNTER — Other Ambulatory Visit: Payer: Self-pay

## 2021-08-17 ENCOUNTER — Encounter: Payer: Self-pay | Admitting: *Deleted

## 2021-08-17 DIAGNOSIS — Z9889 Other specified postprocedural states: Secondary | ICD-10-CM

## 2021-08-17 DIAGNOSIS — M7671 Peroneal tendinitis, right leg: Secondary | ICD-10-CM

## 2021-08-19 NOTE — Progress Notes (Signed)
Subjective:  Patient ID: Dana Martin, female    DOB: April 19, 1966,  MRN: [id number]  Chief Complaint  Patient presents with   Routine Post Op    Pt stated that she still has some discomfort     DOS: 06/07/2021 Procedure: Right peroneal brevis tendon repair with tenodesis  56 y.o. female returns for post-op check.  Patient states she is doing well.  Very minimal pain.  She is weightbearing as tolerated in regular shoes.  No acute complaints.  She would like to know when she can return back to work.  Review of Systems: Negative except as noted in the HPI. Denies N/V/F/Ch.  Past Medical History:  Diagnosis Date   Angio-edema    COPD (chronic obstructive pulmonary disease) ([place])    denies SOB with daily activities   Full dentures    GERD (gastroesophageal reflux disease)    History of pancreatitis    Hypothyroidism    Nasal sinus congestion 05/08/2014   with sinus drainage   PVC (premature ventricular contraction) 07/07/2017   Stress incontinence    Symptomatic cholelithiasis 05/2014   Urticaria    Varicose veins of bilateral lower extremities with pain    Wears dentures    upper and lower   Wears glasses     Current Outpatient Medications:    buPROPion (WELLBUTRIN SR) 100 MG 12 hr tablet, Take 1 tablet (100 mg total) by mouth daily., Disp: 90 tablet, Rfl: 1   Cholecalciferol (VITAMIN D3) 25 MCG (1000 UT) CAPS, Take by mouth. daily, Disp: , Rfl:    diphenhydrAMINE (BENADRYL) 25 MG tablet, Take 25-50 mg by mouth daily as needed for itching (allergic reaction). , Disp: , Rfl:    famotidine (PEPCID) 10 MG tablet, Take 10 mg by mouth daily., Disp: , Rfl:    ibuprofen (ADVIL) 200 MG tablet, Take 400-800 mg by mouth every 6 (six) hours as needed for headache or moderate pain. (Patient not taking: Reported on 07/08/2021), Disp: , Rfl:    ibuprofen (ADVIL) 800 MG tablet, Take 1 tablet (800 mg total) by mouth every 6 (six) hours as needed., Disp: 60 tablet, Rfl: 1   levocetirizine  (XYZAL) 5 MG tablet, Take 5 mg by mouth at bedtime., Disp: , Rfl:    Polyethyl Glycol-Propyl Glycol 0.4-0.3 % SOLN, Place 1 drop into both eyes 5 (five) times daily as needed (dry eyes/irriation)., Disp: , Rfl:    SYNTHROID 175 MCG tablet, TAKE 1 TABLET BY MOUTH EVERY DAY, Disp: 90 tablet, Rfl: 1   Vitamin D, Ergocalciferol, (DRISDOL) 1.25 MG (50000 UNIT) CAPS capsule, Take 1 capsule (50,000 Units total) by mouth 2 (two) times a week. (Patient not taking: Reported on 07/08/2021), Disp: 24 capsule, Rfl: 1  Social History   Tobacco Use  Smoking Status Every Day   Packs/day: 1.00   Years: 25.00   Pack years: 25.00   Types: Cigarettes  Smokeless Tobacco Never  Tobacco Comments   quit for six years, started back 09-2018 - currently on wellbutrin for smoking cessation    Allergies  Allergen Reactions   Fish Allergy Hives   Gadolinium Derivatives Hives, Itching and Rash    Pt stated she felt itching and burning in her throat immediately following the injection.   Multihance [Gadobenate] Hives, Itching and Rash    Itching and burning in her throat. Hives and itching of her skin.   Omnipaque [Iohexol] Hives   Other Nausea And Vomiting    ALL NARCOTICS   Peanut-Containing Drug Products Hives  Shellfish Allergy Hives   Codeine Nausea And Vomiting   Hydrocodone-Acetaminophen Other (See Comments)   Oxycodone Nausea And Vomiting   Tramadol     headaches   Objective:  There were no vitals filed for this visit. There is no height or weight on file to calculate BMI. Constitutional Well developed. Well nourished.  Vascular Foot warm and well perfused. Capillary refill normal to all digits.   Neurologic Normal speech. Oriented to person, place, and time. Epicritic sensation to light touch grossly present bilaterally.  Dermatologic Skin has completely epithelialized.  Good correction alignment noted.  Muscular strength of peroneal tendon at about 80%.  Intact eversion dorsiflexion of the  foot noted with resistance  Orthopedic: Very minimal tenderness to palpation noted about the surgical site.   Radiographs: None Assessment:   1. Peroneal tendinitis, right   2. Status post foot surgery       Plan:  Patient was evaluated and treated and all questions answered.  S/p foot surgery right -Clinically patient pain has considerably improved.  She is ambulating in regular shoes.  At this time patient can return back to work with limited activities that includes 4 hours of sitting versus 4 hours of standing.  Eventually she can go back to standing for 8 hours once she is able to do that.  She states understanding.  No follow-ups on file.

## 2021-09-07 ENCOUNTER — Other Ambulatory Visit: Payer: Self-pay | Admitting: Podiatry

## 2021-09-07 ENCOUNTER — Other Ambulatory Visit: Payer: Self-pay | Admitting: Nurse Practitioner

## 2021-10-05 ENCOUNTER — Other Ambulatory Visit: Payer: Self-pay

## 2021-10-05 ENCOUNTER — Ambulatory Visit (INDEPENDENT_AMBULATORY_CARE_PROVIDER_SITE_OTHER): Payer: BC Managed Care – PPO | Admitting: Podiatry

## 2021-10-05 ENCOUNTER — Encounter: Payer: Self-pay | Admitting: Podiatry

## 2021-10-05 DIAGNOSIS — M7671 Peroneal tendinitis, right leg: Secondary | ICD-10-CM

## 2021-10-05 NOTE — Progress Notes (Signed)
Subjective:  Patient ID: Dana Martin, female    DOB: 1965-11-28,  MRN: [id number]  Chief Complaint  Patient presents with   peroneal tendiniits     Pt stated that she still has some mild discomfort some days are better than others she feels like her foot does better when something is compressing it like the brace     DOS: 06/07/2021 Procedure: Right peroneal brevis tendon repair with tenodesis  56 y.o. female returns for post-op check.  Patient states she is doing well.  She states that is hurting a little bit more because she is gone back to work 8 hours a day.  She is not able to transition herself out of the brace.  She is not able to wear her orthotics because of that.  Review of Systems: Negative except as noted in the HPI. Denies N/V/F/Ch.  Past Medical History:  Diagnosis Date   Angio-edema    COPD (chronic obstructive pulmonary disease) (HCC)    denies SOB with daily activities   Full dentures    GERD (gastroesophageal reflux disease)    History of pancreatitis    Hypothyroidism    Nasal sinus congestion 05/08/2014   with sinus drainage   PVC (premature ventricular contraction) 07/07/2017   Stress incontinence    Symptomatic cholelithiasis 05/2014   Urticaria    Varicose veins of bilateral lower extremities with pain    Wears dentures    upper and lower   Wears glasses     Current Outpatient Medications:    buPROPion (WELLBUTRIN SR) 100 MG 12 hr tablet, Take 1 tablet (100 mg total) by mouth daily., Disp: 90 tablet, Rfl: 1   Cholecalciferol (VITAMIN D3) 25 MCG (1000 UT) CAPS, Take by mouth. daily, Disp: , Rfl:    diphenhydrAMINE (BENADRYL) 25 MG tablet, Take 25-50 mg by mouth daily as needed for itching (allergic reaction). , Disp: , Rfl:    famotidine (PEPCID) 10 MG tablet, Take 10 mg by mouth daily., Disp: , Rfl:    ibuprofen (ADVIL) 200 MG tablet, Take 400-800 mg by mouth every 6 (six) hours as needed for headache or moderate pain. (Patient not taking: Reported  on 07/08/2021), Disp: , Rfl:    ibuprofen (ADVIL) 800 MG tablet, TAKE 1 TABLET BY MOUTH EVERY 6 HOURS AS NEEDED., Disp: 60 tablet, Rfl: 1   levocetirizine (XYZAL) 5 MG tablet, Take 5 mg by mouth at bedtime., Disp: , Rfl:    Polyethyl Glycol-Propyl Glycol 0.4-0.3 % SOLN, Place 1 drop into both eyes 5 (five) times daily as needed (dry eyes/irriation)., Disp: , Rfl:    SYNTHROID 175 MCG tablet, TAKE 1 TABLET BY MOUTH EVERY DAY, Disp: 90 tablet, Rfl: 1   Vitamin D, Ergocalciferol, (DRISDOL) 1.25 MG (50000 UNIT) CAPS capsule, Take 1 capsule (50,000 Units total) by mouth 2 (two) times a week. (Patient not taking: Reported on 07/08/2021), Disp: 24 capsule, Rfl: 1  Social History   Tobacco Use  Smoking Status Every Day   Packs/day: 1.00   Years: 25.00   Pack years: 25.00   Types: Cigarettes  Smokeless Tobacco Never  Tobacco Comments   quit for six years, started back 09-2018 - currently on wellbutrin for smoking cessation    Allergies  Allergen Reactions   Fish Allergy Hives   Gadolinium Derivatives Hives, Itching and Rash    Pt stated she felt itching and burning in her throat immediately following the injection.   Multihance [Gadobenate] Hives, Itching and Rash    Itching  and burning in her throat. Hives and itching of her skin.   Omnipaque [Iohexol] Hives   Other Nausea And Vomiting    ALL NARCOTICS   Peanut-Containing Drug Products Hives   Shellfish Allergy Hives   Codeine Nausea And Vomiting   Hydrocodone-Acetaminophen Other (See Comments)   Oxycodone Nausea And Vomiting   Tramadol     headaches   Objective:  There were no vitals filed for this visit. There is no height or weight on file to calculate BMI. Constitutional Well developed. Well nourished.  Vascular Foot warm and well perfused. Capillary refill normal to all digits.   Neurologic Normal speech. Oriented to person, place, and time. Epicritic sensation to light touch grossly present bilaterally.  Dermatologic Skin  has completely epithelialized.  Good correction alignment noted.  Muscular strength of peroneal tendon at about 80%.  Intact eversion dorsiflexion of the foot noted with resistance    Orthopedic: tenderness to palpation noted about the surgical site along the course of the peroneal tendon.  Pain on palpation right side of the foot.  Pain with dorsiflexion eversion of the foot.   Radiographs: None Assessment:   1. Peroneal tendinitis, right        Plan:  Patient was evaluated and treated and all questions answered.  S/p foot surgery right -Clinically patient pain has considerably improved.  She is ambulating in regular shoes.  At this time patient can return back to work with limited activities that includes 4 hours of sitting versus 4 hours of standing.  Eventually she can go back to standing for 8 hours once she is able to do that.  She states understanding.  Right lateral foot capsulitis -All questions and concerns were discussed with the patient in extensive detail.  Given the amount of pain that she is experiencing especially when she has resumed go back to 8 hours a day.  I believe patient will benefit from steroid injection help decrease acute inflammatory component associate with pain.  Patient agrees with plan would like to proceed with steroid injection. -A steroid injection was performed at right lateral foot at point of maximal tenderness using 1% plain Lidocaine and 10 mg of Kenalog. This was well tolerated.   No follow-ups on file.

## 2021-11-02 ENCOUNTER — Other Ambulatory Visit: Payer: Self-pay

## 2021-11-02 ENCOUNTER — Ambulatory Visit: Payer: BC Managed Care – PPO | Admitting: Podiatry

## 2021-11-02 DIAGNOSIS — M7742 Metatarsalgia, left foot: Secondary | ICD-10-CM | POA: Diagnosis not present

## 2021-11-02 DIAGNOSIS — M7741 Metatarsalgia, right foot: Secondary | ICD-10-CM

## 2021-11-02 DIAGNOSIS — M7671 Peroneal tendinitis, right leg: Secondary | ICD-10-CM

## 2021-11-02 NOTE — Progress Notes (Signed)
?Subjective:  ?Patient ID: Dana Martin, female    DOB: February 25, 1966,  MRN: [id number] ? ?Chief Complaint  ?Patient presents with  ? Foot Pain  ?  Pt stated that she has had some improvement still not at a 100% but its getting better no new concerns   ? ? ?DOS: 06/07/2021 ?Procedure: Right peroneal brevis tendon repair with tenodesis ? ?56 y.o. female returns for post-op check.  Patient states she is doing well.  She states she is doing much better.  She is figured out that her orthotics are causing the issues.  She would like to have the metatarsal pads removed which is digging into the forefoot. ? ?Review of Systems: Negative except as noted in the HPI. Denies N/V/F/Ch. ? ?Past Medical History:  ?Diagnosis Date  ? Angio-edema   ? COPD (chronic obstructive pulmonary disease) ([place])   ? denies SOB with daily activities  ? Full dentures   ? GERD (gastroesophageal reflux disease)   ? History of pancreatitis   ? Hypothyroidism   ? Nasal sinus congestion 05/08/2014  ? with sinus drainage  ? PVC (premature ventricular contraction) 07/07/2017  ? Stress incontinence   ? Symptomatic cholelithiasis 05/2014  ? Urticaria   ? Varicose veins of bilateral lower extremities with pain   ? Wears dentures   ? upper and lower  ? Wears glasses   ? ? ?Current Outpatient Medications:  ?  buPROPion (WELLBUTRIN SR) 100 MG 12 hr tablet, Take 1 tablet (100 mg total) by mouth daily., Disp: 90 tablet, Rfl: 1 ?  Cholecalciferol (VITAMIN D3) 25 MCG (1000 UT) CAPS, Take by mouth. daily, Disp: , Rfl:  ?  diphenhydrAMINE (BENADRYL) 25 MG tablet, Take 25-50 mg by mouth daily as needed for itching (allergic reaction). , Disp: , Rfl:  ?  famotidine (PEPCID) 10 MG tablet, Take 10 mg by mouth daily., Disp: , Rfl:  ?  ibuprofen (ADVIL) 200 MG tablet, Take 400-800 mg by mouth every 6 (six) hours as needed for headache or moderate pain. (Patient not taking: Reported on 07/08/2021), Disp: , Rfl:  ?  ibuprofen (ADVIL) 800 MG tablet, TAKE 1 TABLET BY MOUTH  EVERY 6 HOURS AS NEEDED., Disp: 60 tablet, Rfl: 1 ?  levocetirizine (XYZAL) 5 MG tablet, Take 5 mg by mouth at bedtime., Disp: , Rfl:  ?  Polyethyl Glycol-Propyl Glycol 0.4-0.3 % SOLN, Place 1 drop into both eyes 5 (five) times daily as needed (dry eyes/irriation)., Disp: , Rfl:  ?  SYNTHROID 175 MCG tablet, TAKE 1 TABLET BY MOUTH EVERY DAY, Disp: 90 tablet, Rfl: 1 ?  Vitamin D, Ergocalciferol, (DRISDOL) 1.25 MG (50000 UNIT) CAPS capsule, Take 1 capsule (50,000 Units total) by mouth 2 (two) times a week. (Patient not taking: Reported on 07/08/2021), Disp: 24 capsule, Rfl: 1 ? ?Social History  ? ?Tobacco Use  ?Smoking Status Every Day  ? Packs/day: 1.00  ? Years: 25.00  ? Pack years: 25.00  ? Types: Cigarettes  ?Smokeless Tobacco Never  ?Tobacco Comments  ? quit for six years, started back 09-2018 - currently on wellbutrin for smoking cessation  ? ? ?Allergies  ?Allergen Reactions  ? Fish Allergy Hives  ? Gadolinium Derivatives Hives, Itching and Rash  ?  Pt stated she felt itching and burning in her throat immediately following the injection.  ? Multihance [Gadobenate] Hives, Itching and Rash  ?  Itching and burning in her throat. Hives and itching of her skin.  ? Omnipaque [Iohexol] Hives  ? Other Nausea  And Vomiting  ?  ALL NARCOTICS  ? Peanut-Containing Drug Products Hives  ? Shellfish Allergy Hives  ? Codeine Nausea And Vomiting  ? Hydrocodone-Acetaminophen Other (See Comments)  ? Oxycodone Nausea And Vomiting  ? Tramadol   ?  headaches  ? ?Objective:  ?There were no vitals filed for this visit. ?There is no height or weight on file to calculate BMI. ?Constitutional Well developed. ?Well nourished.  ?Vascular Foot warm and well perfused. ?Capillary refill normal to all digits.   ?Neurologic Normal speech. ?Oriented to person, place, and time. ?Epicritic sensation to light touch grossly present bilaterally.  ?Dermatologic Skin has completely epithelialized.  Good correction alignment noted.  Muscular strength of  peroneal tendon at about 80%.  Intact eversion dorsiflexion of the foot noted with resistance ? ?  ?Orthopedic: No further tenderness to palpation noted about the surgical site along the course of the peroneal tendon.  No further pain on palpation right side of the foot.  No further pain with dorsiflexion eversion of the foot.  ? ?Radiographs: None ?Assessment:  ? ?1. Peroneal tendinitis, right   ?2. Metatarsalgia of both feet   ? ? ? ? ? ? ?Plan:  ?Patient was evaluated and treated and all questions answered. ? ?S/p foot surgery right ?-Clinically patient pain has considerably improved.  She is ambulating in regular shoes.  At this time patient can return back to work with limited activities that includes 4 hours of sitting versus 4 hours of standing.  Eventually she can go back to standing for 8 hours once she is able to do that.  She states understanding. ? ?Right lateral foot capsulitis ?-Clinically improved considerably with a steroid injection.  At this time I discussed with her to return to regular activities.  She can go back to and resume normal activities. ? ?Forefoot metatarsalgia secondary to orthotics ?-I discussed with the patient that the metatarsal pad and the orthotics may be the likely culprit to causing this.  We will plan on removing the metatarsal pads from both orthotics and see if there is a resolve meant. ?-She agrees with the plan.  We will send the orthotics to lab fo removal of pad ? ? ?No follow-ups on file.  ? ?

## 2021-11-04 ENCOUNTER — Telehealth: Payer: Self-pay

## 2021-11-04 NOTE — Telephone Encounter (Signed)
Orthotics sent to Dana Martin for modification ?

## 2021-11-23 ENCOUNTER — Telehealth: Payer: Self-pay | Admitting: Podiatry

## 2021-11-23 NOTE — Telephone Encounter (Signed)
Pt needs app to pu orthotics, no balance ?

## 2021-12-09 ENCOUNTER — Encounter: Payer: Self-pay | Admitting: Nurse Practitioner

## 2021-12-09 ENCOUNTER — Ambulatory Visit (INDEPENDENT_AMBULATORY_CARE_PROVIDER_SITE_OTHER): Payer: BC Managed Care – PPO | Admitting: Nurse Practitioner

## 2021-12-09 VITALS — BP 128/70 | HR 83 | Temp 97.9°F | Ht 65.0 in | Wt 205.0 lb

## 2021-12-09 DIAGNOSIS — Z720 Tobacco use: Secondary | ICD-10-CM

## 2021-12-09 DIAGNOSIS — E559 Vitamin D deficiency, unspecified: Secondary | ICD-10-CM | POA: Diagnosis not present

## 2021-12-09 DIAGNOSIS — M79671 Pain in right foot: Secondary | ICD-10-CM

## 2021-12-09 DIAGNOSIS — Z0000 Encounter for general adult medical examination without abnormal findings: Secondary | ICD-10-CM | POA: Diagnosis not present

## 2021-12-09 DIAGNOSIS — R002 Palpitations: Secondary | ICD-10-CM

## 2021-12-09 DIAGNOSIS — E039 Hypothyroidism, unspecified: Secondary | ICD-10-CM

## 2021-12-09 DIAGNOSIS — E785 Hyperlipidemia, unspecified: Secondary | ICD-10-CM

## 2021-12-09 DIAGNOSIS — Z79899 Other long term (current) drug therapy: Secondary | ICD-10-CM

## 2021-12-09 DIAGNOSIS — Z6834 Body mass index (BMI) 34.0-34.9, adult: Secondary | ICD-10-CM

## 2021-12-09 DIAGNOSIS — E6609 Other obesity due to excess calories: Secondary | ICD-10-CM

## 2021-12-09 NOTE — Patient Instructions (Signed)

## 2021-12-09 NOTE — Progress Notes (Signed)
?Clinical biochemist as a Neurosurgeon for [employer], FNP.,have documented all relevant documentation on the behalf of Arnette Felts, FNP,as directed by  Arnette Felts, FNP while in the presence of Arnette Felts, FNP. ? ?This visit occurred during the SARS-CoV-2 public health emergency.  Safety protocols were in place, including screening questions prior to the visit, additional usage of staff PPE, and extensive cleaning of exam room while observing appropriate contact time as indicated for disinfecting solutions. ? ?Subjective:  ?  ? Patient ID: Dana Martin , female    DOB: [birth year] , 56 y.o.   MRN: [id number] ? ? ?Chief Complaint  ?Patient presents with  ? Annual Exam  ? ? ?HPI ? ?Patient here for physical. She does not get good sleep due to caring for her daughter who is disabled, she is considering a residential facility for her due to her increasing in age.  ? ?Thyroid Problem ?Presents for follow-up visit. Symptoms include palpitations (more aware of them at night. Does drink caffeine.). Patient reports no anxiety or fatigue. The symptoms have been stable.   ? ?Past Medical History:  ?Diagnosis Date  ? Angio-edema   ? COPD (chronic obstructive pulmonary disease) (HCC)   ? denies SOB with daily activities  ? Full dentures   ? GERD (gastroesophageal reflux disease)   ? History of pancreatitis   ? Hypothyroidism   ? Nasal sinus congestion 05/08/2014  ? with sinus drainage  ? PVC (premature ventricular contraction) 07/07/2017  ? Stress incontinence   ? Symptomatic cholelithiasis 05/2014  ? Urticaria   ? Varicose veins of bilateral lower extremities with pain   ? Wears dentures   ? upper and lower  ? Wears glasses   ?  ? ?Family History  ?Problem Relation Age of Onset  ? Hypertension Mother   ? Atrial fibrillation Mother   ? Heart attack Mother   ? Hypertension Sister   ? Hypertension Brother   ? Hypertension Daughter   ? Lung cancer Brother   ? Diabetes Sister   ? ? ? ?Current Outpatient Medications:  ?   buPROPion (WELLBUTRIN SR) 100 MG 12 hr tablet, Take 1 tablet (100 mg total) by mouth daily., Disp: 90 tablet, Rfl: 1 ?  Cholecalciferol (VITAMIN D3) 25 MCG (1000 UT) CAPS, Take by mouth. daily, Disp: , Rfl:  ?  diphenhydrAMINE (BENADRYL) 25 MG tablet, Take 25-50 mg by mouth daily as needed for itching (allergic reaction). , Disp: , Rfl:  ?  famotidine (PEPCID) 10 MG tablet, Take 10 mg by mouth daily., Disp: , Rfl:  ?  ibuprofen (ADVIL) 200 MG tablet, Take 400-800 mg by mouth every 6 (six) hours as needed for headache or moderate pain., Disp: , Rfl:  ?  ibuprofen (ADVIL) 800 MG tablet, TAKE 1 TABLET BY MOUTH EVERY 6 HOURS AS NEEDED., Disp: 60 tablet, Rfl: 1 ?  levocetirizine (XYZAL) 5 MG tablet, Take 5 mg by mouth at bedtime., Disp: , Rfl:  ?  Polyethyl Glycol-Propyl Glycol 0.4-0.3 % SOLN, Place 1 drop into both eyes 5 (five) times daily as needed (dry eyes/irriation)., Disp: , Rfl:  ?  SYNTHROID 175 MCG tablet, TAKE 1 TABLET BY MOUTH EVERY DAY, Disp: 90 tablet, Rfl: 1 ?  Vitamin D, Ergocalciferol, (DRISDOL) 1.25 MG (50000 UNIT) CAPS capsule, Take 1 capsule (50,000 Units total) by mouth 2 (two) times a week., Disp: 24 capsule, Rfl: 1  ? ?Allergies  ?Allergen Reactions  ? Fish Allergy Hives  ? Gadolinium Derivatives Hives, Itching and Rash  ?  Pt stated she felt itching and burning in her throat immediately following the injection.  ? Multihance [Gadobenate] Hives, Itching and Rash  ?  Itching and burning in her throat. Hives and itching of her skin.  ? Omnipaque [Iohexol] Hives  ? Other Nausea And Vomiting  ?  ALL NARCOTICS  ? Peanut-Containing Drug Products Hives  ? Shellfish Allergy Hives  ? Codeine Nausea And Vomiting  ? Hydrocodone-Acetaminophen Other (See Comments)  ? Oxycodone Nausea And Vomiting  ? Tramadol   ?  headaches  ?  ? ? ?The patient states she is status post hysterectomy.  No LMP recorded. Patient has had a hysterectomy.. Negative for Dysmenorrhea and Negative for Menorrhagia. Negative for: breast  discharge, breast lump(s), breast pain and breast self exam. Associated symptoms include abnormal vaginal bleeding. Pertinent negatives include abnormal bleeding (hematology), anxiety, decreased libido, depression, difficulty falling sleep, dyspareunia, history of infertility, nocturia, sexual dysfunction, sleep disturbances, urinary incontinence, urinary urgency, vaginal discharge and vaginal itching. Diet regular tries to avoid greasy foods.  The patient states her exercise level is none.  ? ?The patient's tobacco use is:  ?Social History  ? ?Tobacco Use  ?Smoking Status Every Day  ? Packs/day: 1.00  ? Years: 25.00  ? Pack years: 25.00  ? Types: Cigarettes  ?Smokeless Tobacco Never  ?Tobacco Comments  ? quit for six years, started back 09-2018 - currently on wellbutrin for smoking cessation. 12-09-21 smoking 1/2 pack/day  ? ?She has been exposed to passive smoke. The patient's alcohol use is:  ?Social History  ? ?Substance and Sexual Activity  ?Alcohol Use Not Currently  ? ? ?Review of Systems  ?Constitutional: Negative.  Negative for fatigue.  ?HENT: Negative.    ?Eyes: Negative.   ?Respiratory: Negative.    ?Cardiovascular:  Positive for palpitations (more aware of them at night. Does drink caffeine.). Negative for chest pain.  ?Gastrointestinal: Negative.   ?Endocrine: Negative.   ?Genitourinary: Negative.   ?Musculoskeletal: Negative.   ?Skin: Negative.   ?Allergic/Immunologic: Negative.   ?Neurological: Negative.   ?Hematological: Negative.   ?Psychiatric/Behavioral: Negative.  The patient is not nervous/anxious.    ? ?Today's Vitals  ? 12/09/21 0851  ?BP: 128/70  ?Pulse: 83  ?Temp: 97.9 ?F (36.6 ?C)  ?TempSrc: Oral  ?Weight: 205 lb (93 kg)  ?Height: 5\' 5"  (1.651 m)  ? ?Body mass index is 34.11 kg/m?.  ?Wt Readings from Last 3 Encounters:  ?12/09/21 205 lb (93 kg)  ?07/08/21 198 lb 3.2 oz (89.9 kg)  ?03/09/21 193 lb 6.4 oz (87.7 kg)  ? ? ?Objective:  ?Physical Exam ?Vitals reviewed.  ?Constitutional:   ?    General: She is not in acute distress. ?   Appearance: Normal appearance. She is well-developed. She is obese.  ?HENT:  ?   Head: Normocephalic and atraumatic.  ?   Right Ear: Hearing, tympanic membrane, ear canal and external ear normal. There is no impacted cerumen.  ?   Left Ear: Hearing, tympanic membrane, ear canal and external ear normal. There is no impacted cerumen.  ?   Nose:  ?   Comments: Deferred - masked ?   Mouth/Throat:  ?   Comments: Deferred - masked ?Eyes:  ?   General: Lids are normal.  ?   Extraocular Movements: Extraocular movements intact.  ?   Conjunctiva/sclera: Conjunctivae normal.  ?   Pupils: Pupils are equal, round, and reactive to light.  ?   Funduscopic exam: ?   Right eye: No papilledema.     ?  Left eye: No papilledema.  ?Neck:  ?   Thyroid: No thyroid mass.  ?   Vascular: No carotid bruit.  ?Cardiovascular:  ?   Rate and Rhythm: Normal rate and regular rhythm.  ?   Pulses: Normal pulses.  ?   Heart sounds: Normal heart sounds. No murmur heard. ?Pulmonary:  ?   Effort: Pulmonary effort is normal. No respiratory distress.  ?   Breath sounds: Normal breath sounds. No wheezing.  ?Abdominal:  ?   General: Abdomen is flat. Bowel sounds are normal. There is no distension.  ?   Palpations: Abdomen is soft. There is no mass.  ?   Tenderness: There is no abdominal tenderness.  ?Musculoskeletal:     ?   General: No swelling or tenderness. Normal range of motion.  ?   Cervical back: Full passive range of motion without pain, normal range of motion and neck supple.  ?   Right lower leg: No edema.  ?   Left lower leg: No edema.  ?Skin: ?   General: Skin is warm and dry.  ?   Capillary Refill: Capillary refill takes less than 2 seconds.  ?Neurological:  ?   General: No focal deficit present.  ?   Mental Status: She is alert and oriented to person, place, and time.  ?   Cranial Nerves: No cranial nerve deficit.  ?   Sensory: No sensory deficit.  ?   Motor: No weakness.  ?Psychiatric:     ?   Mood  and Affect: Mood normal.     ?   Behavior: Behavior normal.     ?   Thought Content: Thought content normal.     ?   Judgment: Judgment normal.  ?  ? ?   ?Assessment And Plan:  ?   ?1. Encounter for general adult m

## 2021-12-10 ENCOUNTER — Ambulatory Visit: Payer: BC Managed Care – PPO

## 2021-12-10 DIAGNOSIS — M7741 Metatarsalgia, right foot: Secondary | ICD-10-CM

## 2021-12-10 DIAGNOSIS — M7672 Peroneal tendinitis, left leg: Secondary | ICD-10-CM

## 2021-12-10 DIAGNOSIS — M7671 Peroneal tendinitis, right leg: Secondary | ICD-10-CM

## 2021-12-10 DIAGNOSIS — Z9889 Other specified postprocedural states: Secondary | ICD-10-CM

## 2021-12-10 LAB — THYROID PANEL WITH TSH
Free Thyroxine Index: 3.4 (ref 1.2–4.9)
T3 Uptake Ratio: 31 % (ref 24–39)
T4, Total: 11.1 ug/dL (ref 4.5–12.0)
TSH: 0.994 u[IU]/mL (ref 0.450–4.500)

## 2021-12-10 LAB — CMP14+EGFR
ALT: 12 IU/L (ref 0–32)
AST: 12 IU/L (ref 0–40)
Albumin/Globulin Ratio: 1.3 (ref 1.2–2.2)
Albumin: 4.2 g/dL (ref 3.8–4.9)
Alkaline Phosphatase: 94 IU/L (ref 44–121)
BUN/Creatinine Ratio: 20 (ref 9–23)
BUN: 12 mg/dL (ref 6–24)
Bilirubin Total: 0.2 mg/dL (ref 0.0–1.2)
CO2: 26 mmol/L (ref 20–29)
Calcium: 9.7 mg/dL (ref 8.7–10.2)
Chloride: 101 mmol/L (ref 96–106)
Creatinine, Ser: 0.61 mg/dL (ref 0.57–1.00)
Globulin, Total: 3.3 g/dL (ref 1.5–4.5)
Glucose: 90 mg/dL (ref 70–99)
Potassium: 4.4 mmol/L (ref 3.5–5.2)
Sodium: 140 mmol/L (ref 134–144)
Total Protein: 7.5 g/dL (ref 6.0–8.5)
eGFR: 104 mL/min/{1.73_m2} (ref >59)

## 2021-12-10 LAB — CBC
Hematocrit: 38.4 % (ref 34.0–46.6)
Hemoglobin: 12 g/dL (ref 11.1–15.9)
MCH: 25.8 pg — ABNORMAL LOW (ref 26.6–33.0)
MCHC: 31.3 g/dL — ABNORMAL LOW (ref 31.5–35.7)
MCV: 83 fL (ref 79–97)
Platelets: 325 10*3/uL (ref 150–450)
RBC: 4.65 x10E6/uL (ref 3.77–5.28)
RDW: 15.7 % — ABNORMAL HIGH (ref 11.7–15.4)
WBC: 7.8 10*3/uL (ref 3.4–10.8)

## 2021-12-10 LAB — LIPID PANEL
Chol/HDL Ratio: 3.3 ratio (ref 0.0–4.4)
Cholesterol, Total: 210 mg/dL — ABNORMAL HIGH (ref 100–199)
HDL: 64 mg/dL (ref >39)
LDL Chol Calc (NIH): 132 mg/dL — ABNORMAL HIGH (ref 0–99)
Triglycerides: 77 mg/dL (ref 0–149)
VLDL Cholesterol Cal: 14 mg/dL (ref 5–40)

## 2021-12-10 LAB — VITAMIN D 25 HYDROXY (VIT D DEFICIENCY, FRACTURES): Vit D, 25-Hydroxy: 13.8 ng/mL — ABNORMAL LOW (ref 30.0–100.0)

## 2021-12-10 NOTE — Progress Notes (Signed)
SITUATION: ?Reason for Visit: Fitting and Delivery of Custom Fabricated Foot Orthoses ?Patient Report: Patient reports comfort and is satisfied with device. ? ?OBJECTIVE DATA: ?Patient History / Diagnosis:   ?  ICD-10-CM   ?1. Peroneal tendinitis of left lower extremity  M76.72   ?  ?2. Metatarsalgia of both feet  M77.41   ? M77.42   ?  ?3. Status post foot surgery  Z98.890   ?  ?4. Peroneal tendinitis, right  M76.71   ?  ? ? ?Provided Device:  Custom Functional Foot Orthotics ?    [employer]: [id number] - Adjustment ? ?GOAL OF ORTHOSIS ?- Improve gait ?- Decrease energy expenditure ?- Improve Balance ?- Provide Triplanar stability of foot complex ?- Facilitate motion ? ?ACTIONS PERFORMED ?Patient was fit with foot orthotics trimmed to shoe last. Patient tolerated fittign procedure.  ? ?Patient was provided with verbal and written instruction and demonstration regarding donning, doffing, wear, care, proper fit, function, purpose, cleaning, and use of the orthosis and in all related precautions and risks and benefits regarding the orthosis. ? ?Patient was also provided with verbal instruction regarding how to report any failures or malfunctions of the orthosis and necessary follow up care. Patient was also instructed to contact our office regarding any change in status that may affect the function of the orthosis. ? ?Patient demonstrated independence with proper donning, doffing, and fit and verbalized understanding of all instructions. ? ?PLAN: ?Patient is to follow up in one week or as necessary (PRN). All questions were answered and concerns addressed. Plan of care was discussed with and agreed upon by the patient. ? ?

## 2021-12-13 ENCOUNTER — Telehealth: Payer: Self-pay | Admitting: Nurse Practitioner

## 2021-12-13 NOTE — Telephone Encounter (Signed)
Low dose CT scan approved valid from 12/09/2021 - 02/08/2022. Referral specialist made aware ?

## 2021-12-15 ENCOUNTER — Ambulatory Visit
Admission: RE | Admit: 2021-12-15 | Discharge: 2021-12-15 | Disposition: A | Discharge status: Home / Self Care | Payer: BC Managed Care – PPO | Source: Ambulatory Visit | Attending: Nurse Practitioner | Admitting: Nurse Practitioner

## 2021-12-15 DIAGNOSIS — Z720 Tobacco use: Secondary | ICD-10-CM

## 2021-12-15 DIAGNOSIS — F17210 Nicotine dependence, cigarettes, uncomplicated: Secondary | ICD-10-CM | POA: Diagnosis not present

## 2021-12-16 ENCOUNTER — Other Ambulatory Visit: Payer: Self-pay | Admitting: Nurse Practitioner

## 2021-12-16 DIAGNOSIS — E559 Vitamin D deficiency, unspecified: Secondary | ICD-10-CM

## 2021-12-16 MED ORDER — VITAMIN D (ERGOCALCIFEROL) 1.25 MG (50000 UNIT) PO CAPS: 50000.0000 [IU] | ORAL_CAPSULE | ORAL | 1 refills | Status: DC

## 2022-02-21 ENCOUNTER — Encounter: Payer: Self-pay | Admitting: Nurse Practitioner

## 2022-02-21 ENCOUNTER — Ambulatory Visit: Payer: BC Managed Care – PPO | Admitting: Nurse Practitioner

## 2022-02-21 VITALS — BP 120/70 | HR 101 | Temp 98.2°F | Ht 65.0 in | Wt 198.0 lb

## 2022-02-21 DIAGNOSIS — D649 Anemia, unspecified: Secondary | ICD-10-CM | POA: Diagnosis not present

## 2022-02-21 DIAGNOSIS — K591 Functional diarrhea: Secondary | ICD-10-CM

## 2022-02-21 DIAGNOSIS — R42 Dizziness and giddiness: Secondary | ICD-10-CM | POA: Diagnosis not present

## 2022-02-21 DIAGNOSIS — R110 Nausea: Secondary | ICD-10-CM

## 2022-02-21 NOTE — Patient Instructions (Signed)
Irritable Bowel Syndrome, Adult  Irritable bowel syndrome (IBS) is a group of symptoms that affects the organs responsible for digestion (gastrointestinal tract, or GI tract). IBS is not one specific disease. To regulate how the GI tract works, the body sends signals back and forth between the intestines and the brain. If you have IBS, there may be a problem with these signals. As a result, the GI tract does not function normally. The intestines may become more sensitive and overreact to certain things. This may be especially true when you eat certain foods or when you are under stress. There are four main types of IBS. These may be determined based on the consistency of your stool (feces): IBS with mostly (predominance of) diarrhea. IBS with predominance of constipation. IBS with mixed bowel habits. This includes both diarrhea and constipation. IBS unclassified. This includes IBS that cannot be categorized into one of the other three main types. It is important to know which type of IBS you have. Certain treatments are more likely to be helpful for certain types of IBS. What are the causes? The exact cause of IBS is not known. What increases the risk? You may have a higher risk for IBS if you: Are female. Are younger than 40 years. Have a family history of IBS. Have a mental health condition, such as depression, anxiety, or post-traumatic stress disorder. Have had a bacterial infection of your GI tract. What are the signs or symptoms? Symptoms of IBS vary from person to person. The main symptom is abdominal pain or discomfort. Other symptoms usually include one or more of the following: Diarrhea, constipation, or both. Swelling or bloating in the abdomen. Feeling full after eating a small or regular-sized meal. Frequent gas. Mucus in the stool. A feeling of having more stool left after a bowel movement. Symptoms tend to come and go. They may be triggered by stress, mental health  conditions, or certain foods. How is this diagnosed? This condition may be diagnosed based on a physical exam, your medical history, and your symptoms. You may have tests, such as: Blood tests. Stool test. Colonoscopy. This is a procedure in which your GI tract is viewed with a long, thin, flexible tube. How is this treated? There is no cure for IBS, but treatment can help relieve symptoms. Treatment depends on the type of IBS you have, and may include: Changes to your diet, such as: Avoiding foods that cause symptoms. Drinking more water. Following a low-FODMAP (fermentable oligosaccharides, disaccharides, monosaccharides, and polyols) diet for up to 6 weeks, or as told by your health care provider. FODMAPs are sugars that are hard for some people to digest. Eating more fiber. Eating small meals at the same times every day. Medicines. These may include: Fiber supplements, if you have constipation. Medicine to control diarrhea (antidiarrheal medicines). Medicine to help control muscle tightening (spasms) in your GI tract (antispasmodic medicines). Medicines to help with mental health conditions, such as antidepressants. Talk therapy or counseling. Working with a dietitian to help create a food plan that is right for you. Managing your stress. Follow these instructions at home: Eating and drinking  Eat a healthy diet. Eat 5-6 small meals a day. Try to eat meals at about the same times each day. Do not eat large meals. Gradually eat more fiber-rich foods. These include whole grains, fruits, and vegetables. This may be especially helpful if you have IBS with constipation. Eat a diet low in FODMAPs. You may need to avoid foods such as   citrus fruits, cabbage, garlic, and onions. Drink enough fluid to keep your urine pale yellow. Keep a journal of foods that seem to trigger symptoms. Avoid foods and drinks that: Contain added sugar. Make your symptoms worse. These may include dairy  products, caffeinated drinks, and carbonated drinks. Alcohol use Do not drink alcohol if: Your health care provider tells you not to drink. You are pregnant, may be pregnant, or are planning to become pregnant. If you drink alcohol: Limit how much you have to: 0-1 drink a day for women. 0-2 drinks a day for men. Know how much alcohol is in your drink. In the [place] one drink equals one 12 oz bottle of beer (355 mL), one 5 oz glass of wine (148 mL), or one 1 oz glass of hard liquor (44 mL) General instructions Take over-the-counter and prescription medicines only as told by your health care provider. This includes supplements. Get enough exercise. Do at least 150 minutes of moderate-intensity exercise each week. Manage your stress. Getting enough sleep and exercise can help you manage stress. Keep all follow-up visits. This is important. This includes all visits with your health care provider and therapist. Where to find more information International Foundation for Functional Gastrointestinal Disorders: aboutibs.org National Institute of Diabetes and Digestive and Kidney Diseases: niddk.nih.gov Contact a health care provider if: You have constant pain. You lose weight. You have diarrhea that gets worse. You have bleeding from the rectum. You vomit often. You have a fever. Get help right away if: You have severe abdominal pain. You have diarrhea with symptoms of dehydration, such as dizziness or dry mouth. You have bloody or black stools. You have severe abdominal bloating. You have vomiting that does not stop. You have blood in your vomit. Summary Irritable bowel syndrome (IBS) is not one specific disease. It is a group of symptoms that affects digestion. Your intestines may become more sensitive and overreact to certain things. This may be especially true when you eat certain foods or when you are under stress. There is no cure for IBS, but treatment can help relieve  symptoms. This information is not intended to replace advice given to you by your health care provider. Make sure you discuss any questions you have with your health care provider. Document Revised: 07/07/2021 Document Reviewed: 07/07/2021 Elsevier Patient Education  2023 [employer].  Diet for Irritable Bowel Syndrome When you have irritable bowel syndrome (IBS), it is very important to follow the eating habits that are best for your condition. IBS may cause various symptoms, such as pain in the abdomen, constipation, or diarrhea. Choosing the right foods can help to ease the discomfort from these symptoms. Work with your health care provider and dietitian to find the eating plan that will help to control your symptoms. What are tips for following this plan?  Keep a food diary. This will help you identify foods that cause symptoms. Write down: What you eat and when you eat it. What symptoms you have. When symptoms occur in relation to your meals, such as "pain in abdomen 2 hours after dinner." Eat your meals slowly and in a relaxed setting. Aim to eat 5-6 small meals per day. Do not skip meals. Drink enough fluid to keep your urine pale yellow. Ask your health care provider if you should take an over-the-counter probiotic to help restore healthy bacteria in your gut (digestive tract). Probiotics are foods that contain good bacteria and yeasts. Your dietitian may have specific dietary recommendations for you based   on your symptoms. Your dietitian may recommend that you: Avoid foods that cause symptoms. Talk with your dietitian about other ways to get the same nutrients that are in those problem foods. Avoid foods with gluten. Gluten is a protein that is found in rye, wheat, and barley. Eat more foods that contain soluble fiber. Examples of foods with high soluble fiber include oats, seeds, and certain fruits and vegetables. Take a fiber supplement if told by your dietitian. Reduce or avoid  certain foods called FODMAPs. These are foods that contain sugars that are hard for some people to digest. Ask your health care provider which foods to avoid. What foods should I avoid? The following are some foods and drinks that may make your symptoms worse: Fatty foods, such as french fries. Foods that contain gluten, such as pasta and cereal. Dairy products, such as milk, cheese, and ice cream. Spicy foods. Alcohol. Products with caffeine, such as coffee, tea, or chocolate. Carbonated drinks, such as soda. Foods that are high in FODMAPs. These include certain fruits and vegetables. Products with sweeteners such as honey, high fructose corn syrup, sorbitol, and mannitol. The items listed above may not be a complete list of foods and beverages you should avoid. Contact a dietitian for more information. What foods are good sources of fiber? Your health care provider or dietitian may recommend that you eat more foods that contain fiber. Fiber can help to reduce constipation and other IBS symptoms. Add foods with fiber to your diet a little at a time so your body can get used to them. Too much fiber at one time might cause gas and swelling of your abdomen. The following are some foods that are good sources of fiber: Berries, such as raspberries, strawberries, and blueberries. Tomatoes. Carrots. Brown rice. Oats. Seeds, such as chia and pumpkin seeds. The items listed above may not be a complete list of recommended sources of fiber. Contact your dietitian for more options. Where to find more information International Foundation for Functional Gastrointestinal Disorders: aboutibs.org National Institute of Diabetes and Digestive and Kidney Diseases: niddk.nih.gov Summary When you have irritable bowel syndrome (IBS), it is very important to follow the eating habits that are best for your condition. IBS may cause various symptoms, such as pain in the abdomen, constipation, or diarrhea. Choosing  the right foods can help to ease the discomfort that comes from symptoms. Your health care provider or dietitian may recommend that you eat more foods that contain fiber. Keep a food diary. This will help you identify foods that cause symptoms. This information is not intended to replace advice given to you by your health care provider. Make sure you discuss any questions you have with your health care provider. Document Revised: 07/06/2021 Document Reviewed: 07/06/2021 Elsevier Patient Education  2023 Elsevier Inc.  

## 2022-02-21 NOTE — Progress Notes (Signed)
[place]et [place] Martin,acting as a Education administrator for [name], FNP.,have documented all relevant documentation on the behalf of [name], FNP,as directed by  [name], FNP while in the presence of [name], [place].    Subjective:     Patient ID: Dana Martin , female    DOB: 1965/12/01 , 56 y.o.   MRN: [id number]   Chief Complaint  Patient presents with   Diarrhea   Nausea    HPI  Patient presents today for stomach issues for the last couple weeks. Feels like she has gas. Patient states she hasn't been able to eat. Patient states she is nausea and dizzy this morning for 2 hours every time she tried to lay down including having off and on diarrhea. She has not been eating a good amount so she thought she may be dehydrated. She ate a grilled chicken sandwich and potato chips when she eats out. She has been eating yogurt, sandwiches and egg sandwich. She is only drinking 2 bottles of water daily. Will drink sodas and coffee. She is also taking benefiber. She reports she has been taking ibuprofen multiple times a day regularly due to her foot pain.   BP Readings from Last 3 Encounters: 02/21/22 : 120/70 12/09/21 : 128/70 07/08/21 : 124/70      Past Medical History:  Diagnosis Date   Angio-edema    COPD (chronic obstructive pulmonary disease) ([place])    denies SOB with daily activities   Full dentures    GERD (gastroesophageal reflux disease)    History of pancreatitis    Hypothyroidism    Nasal sinus congestion 05/08/2014   with sinus drainage   Pancreatitis    PVC (premature ventricular contraction) 07/07/2017   Stress incontinence    Symptomatic cholelithiasis 05/2014   Urticaria    Varicose veins of bilateral lower extremities with pain    Wears dentures    upper and lower   Wears glasses      Family History  Problem Relation Age of Onset   Hypertension Mother    Atrial fibrillation Mother    Heart attack Mother    Hypertension Sister    Hypertension Brother     Hypertension Daughter    Lung cancer Brother    Diabetes Sister      Current Outpatient Medications:    buPROPion (WELLBUTRIN SR) 100 MG 12 hr tablet, Take 1 tablet (100 mg total) by mouth daily., Disp: 90 tablet, Rfl: 1   diphenhydrAMINE (BENADRYL) 25 MG tablet, Take 25-50 mg by mouth daily as needed for itching (allergic reaction). , Disp: , Rfl:    famotidine (PEPCID) 10 MG tablet, Take 10 mg by mouth daily., Disp: , Rfl:    ibuprofen (ADVIL) 200 MG tablet, Take 400-800 mg by mouth every 6 (six) hours as needed for headache or moderate pain., Disp: , Rfl:    ibuprofen (ADVIL) 800 MG tablet, TAKE 1 TABLET BY MOUTH EVERY 6 HOURS AS NEEDED., Disp: 60 tablet, Rfl: 1   levocetirizine (XYZAL) 5 MG tablet, Take 5 mg by mouth at bedtime., Disp: , Rfl:    Polyethyl Glycol-Propyl Glycol 0.4-0.3 % SOLN, Place 1 drop into both eyes 5 (five) times daily as needed (dry eyes/irriation)., Disp: , Rfl:    SYNTHROID 175 MCG tablet, TAKE 1 TABLET BY MOUTH EVERY DAY, Disp: 90 tablet, Rfl: 1   Vitamin D, Ergocalciferol, (DRISDOL) 1.25 MG (50000 UNIT) CAPS capsule, Take 1 capsule (50,000 Units total) by mouth 2 (two) times a week.,  Disp: 24 capsule, Rfl: 1   Cholecalciferol (VITAMIN D3) 25 MCG (1000 UT) CAPS, Take by mouth. daily (Patient not taking: Reported on 02/21/2022), Disp: , Rfl:    Allergies  Allergen Reactions   Fish Allergy Hives   Gadolinium Derivatives Hives, Itching and Rash    Pt stated she felt itching and burning in her throat immediately following the injection.   Multihance [Gadobenate] Hives, Itching and Rash    Itching and burning in her throat. Hives and itching of her skin.   Omnipaque [Iohexol] Hives   Other Nausea And Vomiting    ALL NARCOTICS   Peanut-Containing Drug Products Hives   Shellfish Allergy Hives   Codeine Nausea And Vomiting   Hydrocodone-Acetaminophen Other (See Comments)   Oxycodone Nausea And Vomiting   Tramadol     headaches     Review of Systems   Constitutional: Negative.   HENT: Negative.    Eyes: Negative.   Respiratory: Negative.    Cardiovascular: Negative.   Gastrointestinal:  Positive for abdominal pain, diarrhea and nausea. Negative for constipation and vomiting.  Endocrine: Negative.   Genitourinary: Negative.   Musculoskeletal: Negative.   Skin: Negative.   Allergic/Immunologic: Negative.   Neurological: Negative.   Hematological: Negative.   Psychiatric/Behavioral: Negative.       Today's Vitals   02/21/22 1213 02/21/22 1214 02/21/22 1215  BP: (!) 142/84 (!) 122/99 120/70  Pulse: 83 93 (!) 101  Temp:   98.2 F (36.8 C)  TempSrc:   Oral  Weight:   198 lb (89.8 kg)  Height:   5' 5"  (1.651 m)  PainSc:   0-No pain   Body mass index is 32.95 kg/m. Wt Readings from Last 3 Encounters:  02/21/22 198 lb (89.8 kg)  12/09/21 205 lb (93 kg)  07/08/21 198 lb 3.2 oz (89.9 kg)     Objective:  Physical Exam Vitals reviewed.  Constitutional:      General: She is not in acute distress.    Appearance: Normal appearance. She is obese.  Cardiovascular:     Rate and Rhythm: Normal rate and regular rhythm.     Pulses: Normal pulses.     Heart sounds: Normal heart sounds. No murmur heard. Pulmonary:     Effort: Pulmonary effort is normal. No respiratory distress.     Breath sounds: Normal breath sounds. No wheezing.  Abdominal:     General: Abdomen is flat. Bowel sounds are normal. There is no distension.     Palpations: Abdomen is soft. There is no mass.     Tenderness: There is no abdominal tenderness.  Skin:    General: Skin is warm and dry.     Capillary Refill: Capillary refill takes less than 2 seconds.  Neurological:     General: No focal deficit present.     Mental Status: She is alert and oriented to person, place, and time.     Cranial Nerves: No cranial nerve deficit.     Motor: No weakness.  Psychiatric:        Mood and Affect: Mood normal.        Behavior: Behavior normal.        Thought  Content: Thought content normal.        Judgment: Judgment normal.         Assessment And Plan:     1. Nausea Comments: Intermittent nausea, will check kidney functions. She is encouraged to eat bland diet and increase as tolerated. Encouraged to limit ibuprofen -  BMP8+eGFR  2. Functional diarrhea Comments: Continue bland diet and limit fiber intake - BMP8+eGFR  3. Dizziness Comments: She is mildly orthostatic, she is encouraged to increase fluid intake to include drinking pedialyte.  - BMP8+eGFR  4. Anemia, unspecified type - Iron, TIBC and Ferritin Panel - CBC     Patient was given opportunity to ask questions. Patient verbalized understanding of the plan and was able to repeat key elements of the plan. All questions were answered to their satisfaction.  [name], FNP   I, [name], FNP, have reviewed all documentation for this visit. The documentation on 02/21/22 for the exam, diagnosis, procedures, and orders are all accurate and complete.   IF YOU HAVE BEEN REFERRED TO A SPECIALIST, IT MAY TAKE 1-2 WEEKS TO SCHEDULE/PROCESS THE REFERRAL. IF YOU HAVE NOT HEARD FROM US/SPECIALIST IN TWO WEEKS, PLEASE GIVE [place] A CALL AT [phone].   THE PATIENT IS ENCOURAGED TO PRACTICE SOCIAL DISTANCING DUE TO THE COVID-19 PANDEMIC.

## 2022-02-22 LAB — CBC
Hematocrit: 35.6 % (ref 34.0–46.6)
Hemoglobin: 11.7 g/dL (ref 11.1–15.9)
MCH: 26.5 pg — ABNORMAL LOW (ref 26.6–33.0)
MCHC: 32.9 g/dL (ref 31.5–35.7)
MCV: 81 fL (ref 79–97)
Platelets: 306 10*3/uL (ref 150–450)
RBC: 4.42 x10E6/uL (ref 3.77–5.28)
RDW: 15.8 % — ABNORMAL HIGH (ref 11.7–15.4)
WBC: 8.2 10*3/uL (ref 3.4–10.8)

## 2022-02-22 LAB — BMP8+EGFR
BUN/Creatinine Ratio: 16 (ref 9–23)
BUN: 10 mg/dL (ref 6–24)
CO2: 25 mmol/L (ref 20–29)
Calcium: 9.9 mg/dL (ref 8.7–10.2)
Chloride: 102 mmol/L (ref 96–106)
Creatinine, Ser: 0.62 mg/dL (ref 0.57–1.00)
Glucose: 94 mg/dL (ref 70–99)
Potassium: 4.2 mmol/L (ref 3.5–5.2)
Sodium: 141 mmol/L (ref 134–144)
eGFR: 103 mL/min/{1.73_m2} (ref >59)

## 2022-02-22 LAB — IRON,TIBC AND FERRITIN PANEL
Ferritin: 69 ng/mL (ref 15–150)
Iron Saturation: 16 % (ref 15–55)
Iron: 52 ug/dL (ref 27–159)
Total Iron Binding Capacity: 328 ug/dL (ref 250–450)
UIBC: 276 ug/dL (ref 131–425)

## 2022-03-04 ENCOUNTER — Encounter (HOSPITAL_COMMUNITY): Payer: Self-pay | Admitting: Emergency Medicine

## 2022-03-04 ENCOUNTER — Emergency Department (HOSPITAL_COMMUNITY)
Admission: EM | Admit: 2022-03-04 | Discharge: 2022-03-05 | Disposition: A | Discharge status: Home / Self Care | Payer: BC Managed Care – PPO | Attending: Emergency Medicine | Admitting: Emergency Medicine

## 2022-03-04 DIAGNOSIS — Z91010 Allergy to peanuts: Secondary | ICD-10-CM | POA: Insufficient documentation

## 2022-03-04 DIAGNOSIS — R1013 Epigastric pain: Secondary | ICD-10-CM | POA: Diagnosis not present

## 2022-03-04 DIAGNOSIS — E039 Hypothyroidism, unspecified: Secondary | ICD-10-CM | POA: Insufficient documentation

## 2022-03-04 LAB — URINALYSIS, ROUTINE W REFLEX MICROSCOPIC
Glucose, UA: NEGATIVE mg/dL
Ketones, ur: 20 mg/dL — AB
Nitrite: NEGATIVE
Protein, ur: 100 mg/dL — AB
Specific Gravity, Urine: 1.024 (ref 1.005–1.030)
pH: 5 (ref 5.0–8.0)

## 2022-03-04 LAB — CBC
HCT: 37 % (ref 36.0–46.0)
Hemoglobin: 11.8 g/dL — ABNORMAL LOW (ref 12.0–15.0)
MCH: 26.6 pg (ref 26.0–34.0)
MCHC: 31.9 g/dL (ref 30.0–36.0)
MCV: 83.3 fL (ref 80.0–100.0)
Platelets: 278 10*3/uL (ref 150–400)
RBC: 4.44 MIL/uL (ref 3.87–5.11)
RDW: 16.1 % — ABNORMAL HIGH (ref 11.5–15.5)
WBC: 10 10*3/uL (ref 4.0–10.5)
nRBC: 0 % (ref 0.0–0.2)

## 2022-03-04 LAB — COMPREHENSIVE METABOLIC PANEL
ALT: 19 U/L (ref 0–44)
AST: 24 U/L (ref 15–41)
Albumin: 3.8 g/dL (ref 3.5–5.0)
Alkaline Phosphatase: 69 U/L (ref 38–126)
Anion gap: 12 (ref 5–15)
BUN: 18 mg/dL (ref 6–20)
CO2: 22 mmol/L (ref 22–32)
Calcium: 9.6 mg/dL (ref 8.9–10.3)
Chloride: 103 mmol/L (ref 98–111)
Creatinine, Ser: 1.31 mg/dL — ABNORMAL HIGH (ref 0.44–1.00)
GFR, Estimated: 47 mL/min — ABNORMAL LOW (ref >60)
Glucose, Bld: 83 mg/dL (ref 70–99)
Potassium: 3.7 mmol/L (ref 3.5–5.1)
Sodium: 137 mmol/L (ref 135–145)
Total Bilirubin: 0.5 mg/dL (ref 0.3–1.2)
Total Protein: 8 g/dL (ref 6.5–8.1)

## 2022-03-04 LAB — LIPASE, BLOOD: Lipase: 30 U/L (ref 11–51)

## 2022-03-04 NOTE — ED Triage Notes (Signed)
Patient with history of pancreatitis complains of abdominal pain x2 weeks that she states feels like previous episodes. Patient is alert, oriented, ambulatory, and in no apparent distress at this time.

## 2022-03-04 NOTE — ED Provider Triage Note (Signed)
Emergency Medicine Provider Triage Evaluation Note  Dana Martin , a 56 y.o. female  was evaluated in triage.  Pt complains of abdominal pain. States that same is located in the epigastric region of her abdomen. History of pancreatitis, states this feels similarly. No known trigger for her pancreatitis. Endorses some nausea and vomiting, but states that she has mostly stopped eating and drinking to control her flare and is unable to continue doing so due to feeling dehydrated and weak.   Review of Systems  Positive:  Negative:   Physical Exam  BP (!) 138/91   Pulse 63   Temp 97.9 F (36.6 C) (Oral)   Resp 18   SpO2 96%  Gen:   Awake, no distress   Resp:  Normal effort  MSK:   Moves extremities without difficulty  Other:    Medical Decision Making  Medically screening exam initiated at 7:18 PM.  Appropriate orders placed.  Dana Martin was informed that the remainder of the evaluation will be completed by another provider, this initial triage assessment does not replace that evaluation, and the importance of remaining in the ED until their evaluation is complete.     Vear Clock 03/04/22 1922

## 2022-03-04 NOTE — ED Notes (Signed)
Pt back in lobby.

## 2022-03-04 NOTE — ED Notes (Signed)
Pt running to her car real fast and then coming back in lobby.

## 2022-03-05 ENCOUNTER — Encounter (HOSPITAL_COMMUNITY): Payer: Self-pay | Admitting: Emergency Medicine

## 2022-03-05 ENCOUNTER — Other Ambulatory Visit: Payer: Self-pay

## 2022-03-05 MED ORDER — SODIUM CHLORIDE 0.9 % IV BOLUS
1000.0000 mL | Freq: Once | INTRAVENOUS | Status: AC
  Administered 2022-03-05: 1000 mL via INTRAVENOUS

## 2022-03-05 NOTE — ED Provider Notes (Signed)
[facility] EMERGENCY DEPARTMENT Provider Note   CSN: [id number] Arrival date & time: 03/04/22  1816     History  Chief Complaint  Patient presents with   Abdominal Pain    Dana Martin is a 56 y.o. female.  56 yo F w/ hx of acid reflux, pancreatitis, HLD, hypothyroidism presents to the ED for epigastric pain for 2 weeks. She reports having similar symptoms in the past when she was diagnosed with pancreatitis. She reports that the pain is always there, like a dull ache, that worsens at times. She has been taking 800 mg of Advil daily for the past month to help with the abdominal pain and the pain from her right foot surgery. The pain radiates to the right and left flank. Describes that the skin feels "prickly", painful, and sensitive at times.  She has been decreasing her food intake to avoid the pain; pain exacerbated by food. Alleviated by decreasing food intake and Advil. Has had nausea but no vomiting, diarrhea, or fever. Last BM was yesterday, normal without blood. Last colonoscopy was 6 mos ago, without abnormal findings. Denies dysuria, hematuria, abnormal vaginal bleeding or discharge, chest pain, shortness of breath, or tearing back pain. Surgical hx includes cholecystectomy and partial hysterectomy.    The history is provided by the patient. No language interpreter was used.  Abdominal Pain      Home Medications Prior to Admission medications   Medication Sig Start Date End Date Taking? Authorizing Provider  buPROPion (WELLBUTRIN SR) 100 MG 12 hr tablet Take 1 tablet (100 mg total) by mouth daily. 08/19/20   Arnette Felts, FNP  Cholecalciferol (VITAMIN D3) 25 MCG (1000 UT) CAPS Take by mouth. daily Patient not taking: Reported on 02/21/2022    [provider]  diphenhydrAMINE (BENADRYL) 25 MG tablet Take 25-50 mg by mouth daily as needed for itching (allergic reaction).     [provider]  famotidine (PEPCID) 10 MG tablet Take 10 mg by  mouth daily.    [provider]  ibuprofen (ADVIL) 200 MG tablet Take 400-800 mg by mouth every 6 (six) hours as needed for headache or moderate pain.    [provider]  ibuprofen (ADVIL) 800 MG tablet TAKE 1 TABLET BY MOUTH EVERY 6 HOURS AS NEEDED. 09/07/21   Candelaria Stagers, DPM  levocetirizine (XYZAL) 5 MG tablet Take 5 mg by mouth at bedtime.    [provider]  Polyethyl Glycol-Propyl Glycol 0.4-0.3 % SOLN Place 1 drop into both eyes 5 (five) times daily as needed (dry eyes/irriation).    [provider]  SYNTHROID 175 MCG tablet TAKE 1 TABLET BY MOUTH EVERY DAY 09/07/21   Arnette Felts, FNP  Vitamin D, Ergocalciferol, (DRISDOL) 1.25 MG (50000 UNIT) CAPS capsule Take 1 capsule (50,000 Units total) by mouth 2 (two) times a week. 12/16/21   Arnette Felts, FNP      Allergies    Fish allergy, Gadolinium derivatives, Multihance [gadobenate], Omnipaque [iohexol], Other, Peanut-containing drug products, Shellfish allergy, Codeine, Hydrocodone-acetaminophen, Oxycodone, and Tramadol    Review of Systems   Review of Systems  Gastrointestinal:  Positive for abdominal pain.  Ten systems reviewed and are negative for acute change, except as noted in the HPI.    Physical Exam Updated Vital Signs BP (!) 141/80 (BP Location: Left Arm)   Pulse 76   Temp 97.9 F (36.6 C) (Oral)   Resp (!) 21   SpO2 97%   Physical Exam Vitals and nursing  note reviewed.  Constitutional:      General: She is not in acute distress.    Appearance: She is well-developed. She is not diaphoretic.     Comments: Nontoxic appearing and in NAD  HENT:     Head: Normocephalic and atraumatic.  Eyes:     General: No scleral icterus.    Conjunctiva/sclera: Conjunctivae normal.  Cardiovascular:     Rate and Rhythm: Normal rate and regular rhythm.     Pulses: Normal pulses.  Pulmonary:     Effort: Pulmonary effort is normal. No respiratory distress.     Comments: Respirations even and  unlabored Abdominal:     Palpations: Abdomen is soft.     Tenderness: There is no guarding.     Comments: Minimal TTP in upper abdomen on deep palpation. No guarding, palpable masses, peritoneal signs.  Musculoskeletal:        General: Normal range of motion.     Cervical back: Normal range of motion.  Skin:    General: Skin is warm and dry.     Coloration: Skin is not pale.     Findings: No erythema or rash.  Neurological:     Mental Status: She is alert and oriented to person, place, and time.     Coordination: Coordination normal.  Psychiatric:        Behavior: Behavior normal.     ED Results / Procedures / Treatments   Labs (all labs ordered are listed, but only abnormal results are displayed) Labs Reviewed  COMPREHENSIVE METABOLIC PANEL - Abnormal; Notable for the following components:      Result Value   Creatinine, Ser 1.31 (*)    GFR, Estimated 47 (*)    All other components within normal limits  CBC - Abnormal; Notable for the following components:   Hemoglobin 11.8 (*)    RDW 16.1 (*)    All other components within normal limits  URINALYSIS, ROUTINE W REFLEX MICROSCOPIC - Abnormal; Notable for the following components:   Color, Urine AMBER (*)    APPearance CLOUDY (*)    Hgb urine dipstick MODERATE (*)    Bilirubin Urine SMALL (*)    Ketones, ur 20 (*)    Protein, ur 100 (*)    Leukocytes,Ua SMALL (*)    Bacteria, UA RARE (*)    All other components within normal limits  LIPASE, BLOOD    EKG None  Radiology No results found.  Procedures Procedures    Medications Ordered in ED Medications  sodium chloride 0.9 % bolus 1,000 mL (0 mLs Intravenous Stopped 03/05/22 0405)    ED Course/ Medical Decision Making/ A&P Clinical Course as of 03/05/22 1816  Sat Mar 05, 2022  0325 Patient reassessed. Reports she is feeling better. Is requesting discharge. [KH]    Clinical Course User Index [KH] Antony Madura, PA-C                           Medical  Decision Making Amount and/or Complexity of Data Reviewed Labs: ordered.   This patient presents to the ED for concern of epigastric pain, this involves an extensive number of treatment options, and is a complaint that carries with it a high risk of complications and morbidity.  The differential diagnosis includes gastritis vs PUD vs pancreatitis vs choledocholithiasis vs enteritis vs transverse colitis vs bowel perforation   Co morbidities that complicate the patient evaluation  COPD Hypothyroid Pancreatitis GERD   Additional history  obtained:  External records from outside source obtained and reviewed including upper endoscopic ultrasound results from 08/2019, colonoscopy results from 05/2021.   Lab Tests:  I Ordered, and personally interpreted labs.  The pertinent results include:  UA suggestive of dehydration (ketonuria, proteinuria), creatinine of 1.31 (baseline ~ 0.6), stable mild anemia of 11.8. no leukocytosis. LFTs normal. Lipase normal.   Cardiac Monitoring:  The patient was maintained on a cardiac monitor.  I personally viewed and interpreted the cardiac monitored which showed an underlying rhythm of: NSR   Medicines ordered and prescription drug management:  I ordered medication including IVF for mild dehydration and AKI  Reevaluation of the patient after these medicines showed that the patient improved I have reviewed the patients home medicines and have made adjustments as needed   Test Considered:  CT abdomen/pelvis   Reevaluation:  After the interventions noted above, I reevaluated the patient and found that they have :improved   Social Determinants of Health:  Insured    Dispostion:  After consideration of the diagnostic results and the patients response to treatment, I feel that the patent would benefit from outpatient PCP or GI follow up.  Unclear etiology today, but work up is reassuring. Abdominal exam is fairly benign on my examination. No  peritoneal signs. Patient reports her symptoms have spontaneously improved while in the waiting room. She was given IVF for supportive management. Discussed that pain may be related to continual NSAID use rather than pancreatitis given her normal lipase. Advised against Advil and for patient to, instead, take Tylenol. While pain is postprandial, patient no longer has a gallbladder to raise concern for pancreatitis. She has no lab or exam findings to raise suspicion for choledocholithiasis. Chronicity of pain and absence of vomiting make pSBO and SBO highly unlikely.  Return precautions discussed and provided. Patient discharged in stable condition with no unaddressed concerns.         Final Clinical Impression(s) / ED Diagnoses Final diagnoses:  Epigastric pain    Rx / DC Orders ED Discharge Orders     None         Darylene Price 03/13/22 2153    Tilden Fossa, MD 03/13/22 2242

## 2022-03-05 NOTE — Discharge Instructions (Addendum)
Your work up in the ED has been reassuring. Your kidney function was mildly elevated which may be due to dehydration; you were given IV fluids for this. You can have your kidney function rechecked by your primary care doctor in 1-2 weeks. Continue to drink plenty of fluids. We recommend you try to avoid ongoing use of NSAIDs as this may be contributing to your abdominal pain. Follow up with your gastroenterologist if pain persists.

## 2022-03-31 ENCOUNTER — Ambulatory Visit: Payer: BC Managed Care – PPO | Admitting: Podiatry

## 2022-03-31 DIAGNOSIS — Q6672 Congenital pes cavus, left foot: Secondary | ICD-10-CM

## 2022-03-31 DIAGNOSIS — Q6671 Congenital pes cavus, right foot: Secondary | ICD-10-CM | POA: Diagnosis not present

## 2022-03-31 DIAGNOSIS — Q667 Congenital pes cavus, unspecified foot: Secondary | ICD-10-CM

## 2022-04-02 ENCOUNTER — Other Ambulatory Visit: Payer: Self-pay | Admitting: Nurse Practitioner

## 2022-04-07 NOTE — Progress Notes (Signed)
Subjective:  Patient ID: Dana Martin, female    DOB: 1966/03/06,  MRN: [id number]  Chief Complaint  Patient presents with   Foot Pain    Right foot pain  Pt stated that the pain is about the same     56 y.o. female presents with the above complaint.  Patient presents with pain on right submetatarsal base of the fifth second and slightly cavovarus foot deformity probably history of peroneal tendon repair done by me.  She states that she is still having some pain along the peroneal tendon but along the base of the fifth.  She states that when she is standing on her foot during work it hurts with ambulation hurts with pressure she wanted to get it evaluated.  She denies any other acute issues.  Pain scale 7 out of 10 hurts with pressure   Review of Systems: Negative except as noted in the HPI. Denies N/V/F/Ch.  Past Medical History:  Diagnosis Date   Angio-edema    COPD (chronic obstructive pulmonary disease) ([place])    denies SOB with daily activities   Full dentures    GERD (gastroesophageal reflux disease)    History of pancreatitis    Hypothyroidism    Nasal sinus congestion 05/08/2014   with sinus drainage   Pancreatitis    PVC (premature ventricular contraction) 07/07/2017   Stress incontinence    Symptomatic cholelithiasis 05/2014   Urticaria    Varicose veins of bilateral lower extremities with pain    Wears dentures    upper and lower   Wears glasses     Current Outpatient Medications:    buPROPion (WELLBUTRIN SR) 100 MG 12 hr tablet, Take 1 tablet (100 mg total) by mouth daily., Disp: 90 tablet, Rfl: 1   Cholecalciferol (VITAMIN D3) 25 MCG (1000 UT) CAPS, Take by mouth. daily (Patient not taking: Reported on 02/21/2022), Disp: , Rfl:    diphenhydrAMINE (BENADRYL) 25 MG tablet, Take 25-50 mg by mouth daily as needed for itching (allergic reaction). , Disp: , Rfl:    famotidine (PEPCID) 10 MG tablet, Take 10 mg by mouth daily., Disp: , Rfl:    ibuprofen (ADVIL) 200 MG  tablet, Take 400-800 mg by mouth every 6 (six) hours as needed for headache or moderate pain., Disp: , Rfl:    ibuprofen (ADVIL) 800 MG tablet, TAKE 1 TABLET BY MOUTH EVERY 6 HOURS AS NEEDED., Disp: 60 tablet, Rfl: 1   levocetirizine (XYZAL) 5 MG tablet, Take 5 mg by mouth at bedtime., Disp: , Rfl:    Polyethyl Glycol-Propyl Glycol 0.4-0.3 % SOLN, Place 1 drop into both eyes 5 (five) times daily as needed (dry eyes/irriation)., Disp: , Rfl:    SYNTHROID 175 MCG tablet, TAKE 1 TABLET BY MOUTH EVERY DAY, Disp: 90 tablet, Rfl: 1   Vitamin D, Ergocalciferol, (DRISDOL) 1.25 MG (50000 UNIT) CAPS capsule, Take 1 capsule (50,000 Units total) by mouth 2 (two) times a week., Disp: 24 capsule, Rfl: 1  Social History   Tobacco Use  Smoking Status Every Day   Packs/day: 1.00   Years: 25.00   Total pack years: 25.00   Types: Cigarettes  Smokeless Tobacco Never  Tobacco Comments   quit for six years, started back 09-2018 - currently on wellbutrin for smoking cessation. 12-09-21 smoking 1/2 pack/day    Allergies  Allergen Reactions   Fish Allergy Hives   Gadolinium Derivatives Hives, Itching and Rash    Pt stated she felt itching and burning in her throat immediately  following the injection.   Multihance [Gadobenate] Hives, Itching and Rash    Itching and burning in her throat. Hives and itching of her skin.   Omnipaque [Iohexol] Hives   Other Nausea And Vomiting    ALL NARCOTICS   Peanut-Containing Drug Products Hives   Shellfish Allergy Hives   Codeine Nausea And Vomiting   Hydrocodone-Acetaminophen Other (See Comments)   Oxycodone Nausea And Vomiting   Tramadol     headaches   Objective:  There were no vitals filed for this visit. There is no height or weight on file to calculate BMI. Constitutional Well developed. Well nourished.  Vascular Dorsalis pedis pulses palpable bilaterally. Posterior tibial pulses palpable bilaterally. Capillary refill normal to all digits.  No cyanosis or  clubbing noted. Pedal hair growth normal.  Neurologic Normal speech. Oriented to person, place, and time. Epicritic sensation to light touch grossly present bilaterally.  Dermatologic Nails well groomed and normal in appearance. No open wounds. No skin lesions.  Orthopedic: Cavovarus foot structure noted with a history of peroneal tendon repair.  No pain along the palpation of the right peroneal tendon.  Mild pain at the calcaneal tuber.  Pes cavus foot structure noted with excessive pressure to submetatarsal 5 base.  No wounds or lesion noted   Radiographs: None none Assessment:   1. Pes cavus    Plan:  Patient was evaluated and treated and all questions answered.  Pes cavus with fifth met base hyperkeratotic lesion -All questions and concerns were discussed with the patient in extensive detail.  Given the first deformity and structure I believe patient will benefit from an orthotic with offloading of the fifth metatarsal base.  I discussed with patient she states understanding would like to proceed with obtaining another pair of orthotics. -She was casted for orthotics with offloading of fifth metatarsal base  No follow-ups on file.

## 2022-04-27 ENCOUNTER — Ambulatory Visit: Payer: BC Managed Care – PPO

## 2022-04-27 NOTE — Progress Notes (Unsigned)
Patient presents today to pick up custom molded foot orthotics recommended by Dr. Posey Pronto.   Orthotics were dispensed and fit was satisfactory. Reviewed instructions for break-in and wear. Written instructions given to patient.  Patient will follow up as needed.   [facility] - order # [id number]

## 2022-05-03 ENCOUNTER — Ambulatory Visit: Payer: BC Managed Care – PPO | Admitting: Podiatry

## 2022-05-03 ENCOUNTER — Encounter: Payer: Self-pay | Admitting: Nurse Practitioner

## 2022-05-03 ENCOUNTER — Ambulatory Visit: Payer: BC Managed Care – PPO | Admitting: Nurse Practitioner

## 2022-05-03 VITALS — BP 120/80 | HR 92 | Temp 98.0°F | Ht 65.0 in | Wt 197.0 lb

## 2022-05-03 DIAGNOSIS — R0981 Nasal congestion: Secondary | ICD-10-CM

## 2022-05-03 DIAGNOSIS — R051 Acute cough: Secondary | ICD-10-CM | POA: Diagnosis not present

## 2022-05-03 DIAGNOSIS — J069 Acute upper respiratory infection, unspecified: Secondary | ICD-10-CM | POA: Diagnosis not present

## 2022-05-03 LAB — POC INFLUENZA A&B (BINAX/QUICKVUE)
Influenza A, POC: NEGATIVE
Influenza B, POC: NEGATIVE

## 2022-05-03 LAB — POC COVID19 BINAXNOW: SARS Coronavirus 2 Ag: NEGATIVE

## 2022-05-03 MED ORDER — BENZONATATE 100 MG PO CAPS: 100.0000 mg | ORAL_CAPSULE | Freq: Four times a day (QID) | ORAL | 1 refills | Status: DC | PRN

## 2022-05-03 MED ORDER — FLUTICASONE PROPIONATE 50 MCG/ACT NA SUSP
2.0000 | Freq: Every day | NASAL | 2 refills | Status: DC
Start: 2022-05-03 — End: 2023-07-25

## 2022-05-03 MED ORDER — AMOXICILLIN 875 MG PO TABS: 875.0000 mg | ORAL_TABLET | Freq: Two times a day (BID) | ORAL | 0 refills | Status: DC

## 2022-05-03 NOTE — Progress Notes (Signed)
Barnet [place] Martin,acting as a Education administrator for [name], FNP.,have documented all relevant documentation on the behalf of [name], FNP,as directed by  [name], FNP while in the presence of [name], [place].    Subjective:     Patient ID: Dana Martin , female    DOB: [birth year] , 56 y.o.   MRN: [id number]   Chief Complaint  Patient presents with   URI    HPI  Patient presents today symptoms, starting Friday with sore throat, no fever but tired and cough.   URI  This is a new problem. The current episode started in the past 7 days. There has been no fever. Associated symptoms include congestion, coughing (productive), headaches and sinus pain. Pertinent negatives include no chest pain. Treatments tried: advil and robitussin.     Past Medical History:  Diagnosis Date   Angio-edema    COPD (chronic obstructive pulmonary disease) ([place])    denies SOB with daily activities   Full dentures    GERD (gastroesophageal reflux disease)    History of pancreatitis    Hypothyroidism    Nasal sinus congestion 05/08/2014   with sinus drainage   Pancreatitis    PVC (premature ventricular contraction) 07/07/2017   Stress incontinence    Symptomatic cholelithiasis 05/2014   Urticaria    Varicose veins of bilateral lower extremities with pain    Wears dentures    upper and lower   Wears glasses      Family History  Problem Relation Age of Onset   Hypertension Mother    Atrial fibrillation Mother    Heart attack Mother    Hypertension Sister    Hypertension Brother    Hypertension Daughter    Lung cancer Brother    Diabetes Sister      Current Outpatient Medications:    amoxicillin (AMOXIL) 875 MG tablet, Take 1 tablet (875 mg total) by mouth 2 (two) times daily., Disp: 14 tablet, Rfl: 0   benzonatate (TESSALON PERLES) 100 MG capsule, Take 1 capsule (100 mg total) by mouth every 6 (six) hours as needed for cough., Disp: 30 capsule, Rfl: 1   buPROPion (WELLBUTRIN SR)  100 MG 12 hr tablet, Take 1 tablet (100 mg total) by mouth daily., Disp: 90 tablet, Rfl: 1   Cholecalciferol (VITAMIN D3) 25 MCG (1000 UT) CAPS, Take by mouth. daily, Disp: , Rfl:    diphenhydrAMINE (BENADRYL) 25 MG tablet, Take 25-50 mg by mouth daily as needed for itching (allergic reaction). , Disp: , Rfl:    famotidine (PEPCID) 10 MG tablet, Take 10 mg by mouth daily., Disp: , Rfl:    fluticasone (FLONASE ALLERGY RELIEF) 50 MCG/ACT nasal spray, Place 2 sprays into both nostrils daily., Disp: 16 g, Rfl: 2   ibuprofen (ADVIL) 200 MG tablet, Take 400-800 mg by mouth every 6 (six) hours as needed for headache or moderate pain., Disp: , Rfl:    ibuprofen (ADVIL) 800 MG tablet, TAKE 1 TABLET BY MOUTH EVERY 6 HOURS AS NEEDED., Disp: 60 tablet, Rfl: 1   levocetirizine (XYZAL) 5 MG tablet, Take 5 mg by mouth at bedtime., Disp: , Rfl:    Polyethyl Glycol-Propyl Glycol 0.4-0.3 % SOLN, Place 1 drop into both eyes 5 (five) times daily as needed (dry eyes/irriation)., Disp: , Rfl:    SYNTHROID 175 MCG tablet, TAKE 1 TABLET BY MOUTH EVERY DAY, Disp: 90 tablet, Rfl: 1   Vitamin D, Ergocalciferol, (DRISDOL) 1.25 MG (50000 UNIT) CAPS capsule, Take 1 capsule (50,000  Units total) by mouth 2 (two) times a week., Disp: 24 capsule, Rfl: 1   Allergies  Allergen Reactions   Fish Allergy Hives   Gadolinium Derivatives Hives, Itching and Rash    Pt stated she felt itching and burning in her throat immediately following the injection.   Multihance [Gadobenate] Hives, Itching and Rash    Itching and burning in her throat. Hives and itching of her skin.   Omnipaque [Iohexol] Hives   Other Nausea And Vomiting    ALL NARCOTICS   Peanut-Containing Drug Products Hives   Shellfish Allergy Hives   Codeine Nausea And Vomiting   Hydrocodone-Acetaminophen Other (See Comments)   Oxycodone Nausea And Vomiting   Tramadol     headaches     Review of Systems  Constitutional: Negative.   HENT:  Positive for congestion and  sinus pain.   Eyes: Negative.   Respiratory:  Positive for cough (productive).   Cardiovascular: Negative.  Negative for chest pain.  Gastrointestinal: Negative.   Neurological:  Positive for headaches.     Today's Vitals   05/03/22 1212  BP: 120/80  Pulse: 92  Temp: 98 F (36.7 C)  TempSrc: Oral  Weight: 197 lb (89.4 kg)  Height: 5\' 5"  (1.651 m)   Body mass index is 32.78 kg/m.   Objective:  Physical Exam Vitals reviewed.  Constitutional:      General: She is not in acute distress.    Appearance: Normal appearance.  HENT:     Head: Normocephalic.     Nose: Congestion present.     Mouth/Throat:     Mouth: Mucous membranes are moist.     Comments: Post nasal drainage Cardiovascular:     Rate and Rhythm: Normal rate and regular rhythm.     Pulses: Normal pulses.     Heart sounds: Normal heart sounds.  Pulmonary:     Effort: Pulmonary effort is normal. No respiratory distress.     Breath sounds: Normal breath sounds. No wheezing.  Musculoskeletal:     Cervical back: Normal range of motion and neck supple.  Skin:    General: Skin is warm and dry.     Capillary Refill: Capillary refill takes less than 2 seconds.  Neurological:     General: No focal deficit present.     Mental Status: She is alert and oriented to person, place, and time.     Cranial Nerves: No cranial nerve deficit.     Motor: No weakness.         Assessment And Plan:     1. Acute cough Comments: Will send Rx for Tessalon perles.  - amoxicillin (AMOXIL) 875 MG tablet; Take 1 tablet (875 mg total) by mouth 2 (two) times daily.  Dispense: 14 tablet; Refill: 0 - POC COVID-19 BinaxNow - POC Influenza A&B(BINAX/QUICKVUE) - Novel Coronavirus, NAA ([facility])  2. Nasal congestion Comments: May use steroid nasal spray as needed.   - amoxicillin (AMOXIL) 875 MG tablet; Take 1 tablet (875 mg total) by mouth 2 (two) times daily.  Dispense: 14 tablet; Refill: 0 - POC COVID-19 BinaxNow - POC Influenza  A&B(BINAX/QUICKVUE) - Novel Coronavirus, NAA ([facility])  3. Upper respiratory tract infection, unspecified type Comments: Will treat with antibiotic, negative rapid flu A/B and rapid covid - amoxicillin (AMOXIL) 875 MG tablet; Take 1 tablet (875 mg total) by mouth 2 (two) times daily.  Dispense: 14 tablet; Refill: 0     Patient was given opportunity to ask questions. Patient verbalized understanding of the  plan and was able to repeat key elements of the plan. All questions were answered to their satisfaction.  [name], FNP   I, [name], FNP, have reviewed all documentation for this visit. The documentation on 05/03/22 for the exam, diagnosis, procedures, and orders are all accurate and complete.   IF YOU HAVE BEEN REFERRED TO A SPECIALIST, IT MAY TAKE 1-2 WEEKS TO SCHEDULE/PROCESS THE REFERRAL. IF YOU HAVE NOT HEARD FROM US/SPECIALIST IN TWO WEEKS, PLEASE GIVE [place] A CALL AT [phone].   THE PATIENT IS ENCOURAGED TO PRACTICE SOCIAL DISTANCING DUE TO THE COVID-19 PANDEMIC.

## 2022-05-04 LAB — NOVEL CORONAVIRUS, NAA: SARS-CoV-2, NAA: NOT DETECTED

## 2022-05-06 ENCOUNTER — Encounter: Payer: Self-pay | Admitting: Nurse Practitioner

## 2022-05-10 ENCOUNTER — Ambulatory Visit (INDEPENDENT_AMBULATORY_CARE_PROVIDER_SITE_OTHER): Payer: BC Managed Care – PPO | Admitting: Podiatry

## 2022-05-10 ENCOUNTER — Other Ambulatory Visit: Payer: Self-pay | Admitting: Nurse Practitioner

## 2022-05-10 DIAGNOSIS — M7672 Peroneal tendinitis, left leg: Secondary | ICD-10-CM

## 2022-05-10 DIAGNOSIS — Q667 Congenital pes cavus, unspecified foot: Secondary | ICD-10-CM

## 2022-05-10 MED ORDER — PROMETHAZINE-DM 6.25-15 MG/5ML PO SYRP: 5.0000 mL | ORAL_SOLUTION | Freq: Four times a day (QID) | ORAL | 0 refills | Status: DC | PRN

## 2022-05-10 NOTE — Progress Notes (Signed)
Subjective:  Patient ID: Dana Martin, female    DOB: 02-02-1966,  MRN: [id number]  Chief Complaint  Patient presents with   Foot Pain    56 y.o. female presents with the above complaint.  Patient presents with follow-up of right peroneal tendinitis with tendon repair.  She states that she is still hurting a little bit.  She states it is definitely in the shoes is causing her some pain.  She is working on getting better shoes.  The orthotics are working fine.   Review of Systems: Negative except as noted in the HPI. Denies N/V/F/Ch.  Past Medical History:  Diagnosis Date   Angio-edema    COPD (chronic obstructive pulmonary disease) ([place])    denies SOB with daily activities   Full dentures    GERD (gastroesophageal reflux disease)    History of pancreatitis    Hypothyroidism    Nasal sinus congestion 05/08/2014   with sinus drainage   Pancreatitis    PVC (premature ventricular contraction) 07/07/2017   Stress incontinence    Symptomatic cholelithiasis 05/2014   Urticaria    Varicose veins of bilateral lower extremities with pain    Wears dentures    upper and lower   Wears glasses     Current Outpatient Medications:    amoxicillin (AMOXIL) 875 MG tablet, Take 1 tablet (875 mg total) by mouth 2 (two) times daily., Disp: 14 tablet, Rfl: 0   benzonatate (TESSALON PERLES) 100 MG capsule, Take 1 capsule (100 mg total) by mouth every 6 (six) hours as needed for cough., Disp: 30 capsule, Rfl: 1   buPROPion (WELLBUTRIN SR) 100 MG 12 hr tablet, Take 1 tablet (100 mg total) by mouth daily., Disp: 90 tablet, Rfl: 1   Cholecalciferol (VITAMIN D3) 25 MCG (1000 UT) CAPS, Take by mouth. daily, Disp: , Rfl:    diphenhydrAMINE (BENADRYL) 25 MG tablet, Take 25-50 mg by mouth daily as needed for itching (allergic reaction). , Disp: , Rfl:    famotidine (PEPCID) 10 MG tablet, Take 10 mg by mouth daily., Disp: , Rfl:    fluticasone (FLONASE ALLERGY RELIEF) 50 MCG/ACT nasal spray, Place 2  sprays into both nostrils daily., Disp: 16 g, Rfl: 2   ibuprofen (ADVIL) 200 MG tablet, Take 400-800 mg by mouth every 6 (six) hours as needed for headache or moderate pain., Disp: , Rfl:    ibuprofen (ADVIL) 800 MG tablet, TAKE 1 TABLET BY MOUTH EVERY 6 HOURS AS NEEDED., Disp: 60 tablet, Rfl: 1   levocetirizine (XYZAL) 5 MG tablet, Take 5 mg by mouth at bedtime., Disp: , Rfl:    Polyethyl Glycol-Propyl Glycol 0.4-0.3 % SOLN, Place 1 drop into both eyes 5 (five) times daily as needed (dry eyes/irriation)., Disp: , Rfl:    SYNTHROID 175 MCG tablet, TAKE 1 TABLET BY MOUTH EVERY DAY, Disp: 90 tablet, Rfl: 1   Vitamin D, Ergocalciferol, (DRISDOL) 1.25 MG (50000 UNIT) CAPS capsule, Take 1 capsule (50,000 Units total) by mouth 2 (two) times a week., Disp: 24 capsule, Rfl: 1  Social History   Tobacco Use  Smoking Status Every Day   Packs/day: 1.00   Years: 25.00   Total pack years: 25.00   Types: Cigarettes  Smokeless Tobacco Never  Tobacco Comments   quit for six years, started back 09-2018 - currently on wellbutrin for smoking cessation. 12-09-21 smoking 1/2 pack/day    Allergies  Allergen Reactions   Fish Allergy Hives   Gadolinium Derivatives Hives, Itching and Rash  Pt stated she felt itching and burning in her throat immediately following the injection.   Multihance [Gadobenate] Hives, Itching and Rash    Itching and burning in her throat. Hives and itching of her skin.   Omnipaque [Iohexol] Hives   Other Nausea And Vomiting    ALL NARCOTICS   Peanut-Containing Drug Products Hives   Shellfish Allergy Hives   Codeine Nausea And Vomiting   Hydrocodone-Acetaminophen Other (See Comments)   Oxycodone Nausea And Vomiting   Tramadol     headaches   Objective:  There were no vitals filed for this visit. There is no height or weight on file to calculate BMI. Constitutional Well developed. Well nourished.  Vascular Dorsalis pedis pulses palpable bilaterally. Posterior tibial pulses  palpable bilaterally. Capillary refill normal to all digits.  No cyanosis or clubbing noted. Pedal hair growth normal.  Neurologic Normal speech. Oriented to person, place, and time. Epicritic sensation to light touch grossly present bilaterally.  Dermatologic Nails well groomed and normal in appearance. No open wounds. No skin lesions.  Orthopedic: Cavovarus foot structure noted with a history of peroneal tendon repair.  No pain along the palpation of the right peroneal tendon.  Mild pain at the calcaneal tuber.  Pes cavus foot structure noted with excessive pressure to submetatarsal 5 base.  No wounds or lesion noted   Radiographs: None none Assessment:   1. Pes cavus   2. Peroneal tendinitis of left lower extremity     Plan:  Patient was evaluated and treated and all questions answered.  Pes cavus with fifth met base hyperkeratotic lesion -All questions and concerns were discussed with the patient in extensive detail.  Given the first deformity and structure I believe patient will benefit from an orthotic with offloading of the fifth metatarsal base.  I discussed with patient she states understanding would like to proceed with obtaining another pair of orthotics. -Orthotics are functioning well -I discussed shoe gear modification as the current shoes tend to be well worn out.  She states understand will obtain new pair of shoes -She will also benefit from night splint.  I encouraged her to look at online sources such as [employer] to obtain night splint.  She states understanding. -If there is no improvement we will discuss repeat MRI with a possible surgical consultation  No follow-ups on file.

## 2022-06-13 ENCOUNTER — Encounter: Payer: Self-pay | Admitting: Nurse Practitioner

## 2022-06-13 ENCOUNTER — Ambulatory Visit: Payer: BC Managed Care – PPO | Admitting: Nurse Practitioner

## 2022-06-13 VITALS — BP 130/70 | HR 92 | Temp 98.1°F | Ht 65.0 in | Wt 200.0 lb

## 2022-06-13 DIAGNOSIS — E538 Deficiency of other specified B group vitamins: Secondary | ICD-10-CM

## 2022-06-13 DIAGNOSIS — E039 Hypothyroidism, unspecified: Secondary | ICD-10-CM

## 2022-06-13 DIAGNOSIS — E785 Hyperlipidemia, unspecified: Secondary | ICD-10-CM | POA: Diagnosis not present

## 2022-06-13 DIAGNOSIS — Z2821 Immunization not carried out because of patient refusal: Secondary | ICD-10-CM

## 2022-06-13 DIAGNOSIS — Z720 Tobacco use: Secondary | ICD-10-CM

## 2022-06-13 DIAGNOSIS — E559 Vitamin D deficiency, unspecified: Secondary | ICD-10-CM | POA: Diagnosis not present

## 2022-06-13 DIAGNOSIS — L989 Disorder of the skin and subcutaneous tissue, unspecified: Secondary | ICD-10-CM

## 2022-06-13 DIAGNOSIS — H9193 Unspecified hearing loss, bilateral: Secondary | ICD-10-CM

## 2022-06-13 NOTE — Patient Instructions (Signed)
Dyslipidemia Dyslipidemia is an imbalance of waxy, fat-like substances (lipids) in the blood. The body needs lipids in small amounts. Dyslipidemia often involves a high level of cholesterol or triglycerides, which are types of lipids. Common forms of dyslipidemia include: High levels of LDL cholesterol. LDL is the type of cholesterol that causes fatty deposits (plaques) to build up in the blood vessels that carry blood away from the heart (arteries). Low levels of HDL cholesterol. HDL cholesterol is the type of cholesterol that protects against heart disease. High levels of HDL remove the LDL buildup from arteries. High levels of triglycerides. Triglycerides are a fatty substance in the blood that is linked to a buildup of plaques in the arteries. What are the causes? There are two main types of dyslipidemia: primary and secondary. Primary dyslipidemia is caused by changes (mutations) in genes that are passed down through families (inherited). These mutations cause several types of dyslipidemia. Secondary dyslipidemia may be caused by various risk factors that can lead to the disease, such as lifestyle choices and certain medical conditions. What increases the risk? You are more likely to develop this condition if you are an older man or if you are a woman who has gone through menopause. Other risk factors include: Having a family history of dyslipidemia. Taking certain medicines, including birth control pills, steroids, some diuretics, and beta-blockers. Eating a diet high in saturated fat. Smoking cigarettes or excessive alcohol intake. Having certain medical conditions such as diabetes, polycystic ovary syndrome (PCOS), kidney disease, liver disease, or hypothyroidism. Not exercising regularly. Being overweight or obese with too much belly fat. What are the signs or symptoms? In most cases, dyslipidemia does not usually cause any symptoms. In severe cases, very high lipid levels can  cause: Fatty bumps under the skin (xanthomas). A white or gray ring around the black center (pupil) of the eye. Very high triglyceride levels can cause inflammation of the pancreas (pancreatitis). How is this diagnosed? Your health care provider may diagnose dyslipidemia based on a routine blood test (fasting blood test). Because most people do not have symptoms of the condition, this blood testing (lipid profile) is done on adults age 20 and older and is repeated every 4-6 years. This test checks: Total cholesterol. This measures the total amount of cholesterol in your blood, including LDL cholesterol, HDL cholesterol, and triglycerides. A healthy number is below 200 mg/dL (5.17 mmol/L). LDL cholesterol. The target number for LDL cholesterol is different for each person, depending on individual risk factors. A healthy number is usually below 100 mg/dL (2.59 mmol/L). Ask your health care provider what your LDL cholesterol should be. HDL cholesterol. An HDL level of 60 mg/dL (1.55 mmol/L) or higher is best because it helps to protect against heart disease. A number below 40 mg/dL (1.03 mmol/L) for men or below 50 mg/dL (1.29 mmol/L) for women increases the risk for heart disease. Triglycerides. A healthy triglyceride number is below 150 mg/dL (1.69 mmol/L). If your lipid profile is abnormal, your health care provider may do other blood tests. How is this treated? Treatment depends on the type of dyslipidemia that you have and your other risk factors for heart disease and stroke. Your health care provider will have a target range for your lipid levels based on this information. Treatment for dyslipidemia starts with lifestyle changes, such as diet and exercise. Your health care provider may recommend that you: Get regular exercise. Make changes to your diet. Quit smoking if you smoke. Limit your alcohol intake. If diet   changes and exercise do not help you reach your goals, your health care provider  may also prescribe medicine to lower lipids. The most commonly prescribed type of medicine lowers your LDL cholesterol (statin drug). If you have a high triglyceride level, your provider may prescribe another type of drug (fibrate) or an omega-3 fish oil supplement, or both. Follow these instructions at home: Eating and drinking  Follow instructions from your health care provider or dietitian about eating or drinking restrictions. Eat a healthy diet as told by your health care provider. This can help you reach and maintain a healthy weight, lower your LDL cholesterol, and raise your HDL cholesterol. This may include: Limiting your calories, if you are overweight. Eating more fruits, vegetables, whole grains, fish, and lean meats. Limiting saturated fat, trans fat, and cholesterol. Do not drink alcohol if: Your health care provider tells you not to drink. You are pregnant, may be pregnant, or are planning to become pregnant. If you drink alcohol: Limit how much you have to: 0-1 drink a day for women. 0-2 drinks a day for men. Know how much alcohol is in your drink. In the [place] one drink equals one 12 oz bottle of beer (355 mL), one 5 oz glass of wine (148 mL), or one 1 oz glass of hard liquor (44 mL). Activity Get regular exercise. Start an exercise and strength training program as told by your health care provider. Ask your health care provider what activities are safe for you. Your health care provider may recommend: 30 minutes of aerobic activity 4-6 days a week. Brisk walking is an example of aerobic activity. Strength training 2 days a week. General instructions Do not use any products that contain nicotine or tobacco. These products include cigarettes, chewing tobacco, and vaping devices, such as e-cigarettes. If you need help quitting, ask your health care provider. Take over-the-counter and prescription medicines only as told by your health care provider. This includes  supplements. Keep all follow-up visits. This is important. Contact a health care provider if: You are having trouble sticking to your exercise or diet plan. You are struggling to quit smoking or to control your use of alcohol. Summary Dyslipidemia often involves a high level of cholesterol or triglycerides, which are types of lipids. Treatment depends on the type of dyslipidemia that you have and your other risk factors for heart disease and stroke. Treatment for dyslipidemia starts with lifestyle changes, such as diet and exercise. Your health care provider may prescribe medicine to lower lipids. This information is not intended to replace advice given to you by your health care provider. Make sure you discuss any questions you have with your health care provider. Document Revised: 02/25/2022 Document Reviewed: 09/28/2020 [employer] Patient Education  2023 [employer]  

## 2022-06-13 NOTE — Progress Notes (Signed)
I,Dana Martin,acting as a Education administrator for [employer], FNP.,have documented all relevant documentation on the behalf of Dana Brine, FNP,as directed by  Dana Brine, FNP while in the presence of Dana Martin, [place].  Subjective:     Patient ID: Dana Martin , female    DOB: 1966/03/29 , 56 y.o.   MRN: [id number]   Chief Complaint  Patient presents with   Hyperlipidemia    HPI  Patient presents today for cholesterol and thyroid check.   Hyperlipidemia This is a chronic problem. The current episode started more than 1 year ago. The problem is controlled. Recent lipid tests were reviewed and are normal. She has no history of chronic renal disease. There are no known factors aggravating her hyperlipidemia.     Past Medical History:  Diagnosis Date   Angio-edema    COPD (chronic obstructive pulmonary disease) ([place])    denies SOB with daily activities   Full dentures    GERD (gastroesophageal reflux disease)    History of pancreatitis    Hypothyroidism    Nasal sinus congestion 05/08/2014   with sinus drainage   Pancreatitis    PVC (premature ventricular contraction) 07/07/2017   Stress incontinence    Symptomatic cholelithiasis 05/2014   Urticaria    Varicose veins of bilateral lower extremities with pain    Wears dentures    upper and lower   Wears glasses      Family History  Problem Relation Age of Onset   Hypertension Mother    Atrial fibrillation Mother    Heart attack Mother    Hypertension Sister    Hypertension Brother    Hypertension Daughter    Lung cancer Brother    Diabetes Sister      Current Outpatient Medications:    benzonatate (TESSALON PERLES) 100 MG capsule, Take 1 capsule (100 mg total) by mouth every 6 (six) hours as needed for cough., Disp: 30 capsule, Rfl: 1   buPROPion (WELLBUTRIN SR) 100 MG 12 hr tablet, Take 1 tablet (100 mg total) by mouth daily., Disp: 90 tablet, Rfl: 1   Cholecalciferol (VITAMIN D3) 25 MCG (1000 UT) CAPS, Take by  mouth. daily, Disp: , Rfl:    diphenhydrAMINE (BENADRYL) 25 MG tablet, Take 25-50 mg by mouth daily as needed for itching (allergic reaction). , Disp: , Rfl:    famotidine (PEPCID) 10 MG tablet, Take 10 mg by mouth daily., Disp: , Rfl:    fluticasone (FLONASE ALLERGY RELIEF) 50 MCG/ACT nasal spray, Place 2 sprays into both nostrils daily., Disp: 16 g, Rfl: 2   ibuprofen (ADVIL) 200 MG tablet, Take 400-800 mg by mouth every 6 (six) hours as needed for headache or moderate pain., Disp: , Rfl:    ibuprofen (ADVIL) 800 MG tablet, TAKE 1 TABLET BY MOUTH EVERY 6 HOURS AS NEEDED., Disp: 60 tablet, Rfl: 1   levocetirizine (XYZAL) 5 MG tablet, Take 5 mg by mouth at bedtime., Disp: , Rfl:    Polyethyl Glycol-Propyl Glycol 0.4-0.3 % SOLN, Place 1 drop into both eyes 5 (five) times daily as needed (dry eyes/irriation)., Disp: , Rfl:    promethazine-dextromethorphan (PROMETHAZINE-DM) 6.25-15 MG/5ML syrup, Take 5 mLs by mouth 4 (four) times daily as needed for cough., Disp: 118 mL, Rfl: 0   SYNTHROID 175 MCG tablet, TAKE 1 TABLET BY MOUTH EVERY DAY, Disp: 90 tablet, Rfl: 1   Vitamin D, Ergocalciferol, (DRISDOL) 1.25 MG (50000 UNIT) CAPS capsule, Take 1 capsule (50,000 Units total) by mouth 2 (two) times a  week., Disp: 24 capsule, Rfl: 1   Allergies  Allergen Reactions   Fish Allergy Hives   Gadolinium Derivatives Hives, Itching and Rash    Pt stated she felt itching and burning in her throat immediately following the injection.   Multihance [Gadobenate] Hives, Itching and Rash    Itching and burning in her throat. Hives and itching of her skin.   Omnipaque [Iohexol] Hives   Other Nausea And Vomiting    ALL NARCOTICS   Peanut-Containing Drug Products Hives   Shellfish Allergy Hives   Codeine Nausea And Vomiting   Hydrocodone-Acetaminophen Other (See Comments)   Oxycodone Nausea And Vomiting   Tramadol     headaches     Review of Systems  Constitutional: Negative.   Respiratory: Negative.     Cardiovascular: Negative.   Gastrointestinal: Negative.   Neurological: Negative.      Today's Vitals   06/13/22 0836  BP: 130/70  Pulse: 92  Temp: 98.1 F (36.7 C)  TempSrc: Oral  Weight: 200 lb (90.7 kg)  Height: 5\' 5"  (1.651 m)   Body mass index is 33.28 kg/m.   Objective:  Physical Exam Vitals reviewed.  Constitutional:      General: She is not in acute distress.    Appearance: Normal appearance. She is obese.  HENT:     Right Ear: Tympanic membrane, ear canal and external ear normal. There is no impacted cerumen.     Left Ear: Tympanic membrane, ear canal and external ear normal. There is no impacted cerumen.     Ears:     Comments: Left ear has some wax build up but not significant.  Cardiovascular:     Rate and Rhythm: Normal rate and regular rhythm.     Pulses: Normal pulses.     Heart sounds: Normal heart sounds. No murmur heard. Pulmonary:     Effort: Pulmonary effort is normal. No respiratory distress.     Breath sounds: Normal breath sounds. No wheezing.  Musculoskeletal:     Comments: Limited range of motion to right lower extremity post surgery, has walking boot on  Skin:    General: Skin is warm and dry.     Findings: No lesion (posterior right upper arm with irregular shape and dryness to center, brown in color. Size 0.5 mm x 0.5 mm).  Neurological:     General: No focal deficit present.     Mental Status: She is alert and oriented to person, place, and time.     Cranial Nerves: No cranial nerve deficit.     Motor: No weakness.  Psychiatric:        Mood and Affect: Mood normal.        Behavior: Behavior normal.        Thought Content: Thought content normal.        Judgment: Judgment normal.         Assessment And Plan:     1. Hyperlipidemia, unspecified hyperlipidemia type Comments: Continue statin - Lipid panel  2. Acquired hypothyroidism Comments: Well controlled, continue current medications. Will make changes pending lab results -  Thyroid Panel With TSH  3. Vitamin D deficiency Comments: Encouraged to be more consistent with taking her supplements. Will check levels - VITAMIN D 25 Hydroxy (Vit-D Deficiency, Fractures)  4. Vitamin B12 deficiency Comments: Encouraged to be more consistent with taking her supplements. Will check levels - Vitamin B12  5. Skin lesion Comments: posterior right upper arm with irregular shape and dryness to center,  brown in color. Size 0.5 mm x 0.5 mm. Will refer to Dermatology. - Ambulatory referral to Dermatology  6. Bilateral hearing loss, unspecified hearing loss type Comments: She does have small amount of cerumen to right ear and left ear is clear. Will refer to ENT for further evaluation. Gross hearing is intact - Ambulatory referral to ENT  7. Herpes zoster vaccination declined Comments: She is not interested at this time. Discussed symptoms and effects of shingles. She is aware to notify me if changes her mind.  8. Tobacco abuse Comments: She is down to 5-6 cigarettes a day     Patient was given opportunity to ask questions. Patient verbalized understanding of the plan and was able to repeat key elements of the plan. All questions were answered to their satisfaction.  Dana Brine, FNP   I, Dana Brine, FNP, have reviewed all documentation for this visit. The documentation on 06/13/22 for the exam, diagnosis, procedures, and orders are all accurate and complete.   IF YOU HAVE BEEN REFERRED TO A SPECIALIST, IT MAY TAKE 1-2 WEEKS TO SCHEDULE/PROCESS THE REFERRAL. IF YOU HAVE NOT HEARD FROM US/SPECIALIST IN TWO WEEKS, PLEASE GIVE [place] A CALL AT [phone].   THE PATIENT IS ENCOURAGED TO PRACTICE SOCIAL DISTANCING DUE TO THE COVID-19 PANDEMIC.

## 2022-06-14 LAB — LIPID PANEL
Chol/HDL Ratio: 3.3 ratio (ref 0.0–4.4)
Cholesterol, Total: 206 mg/dL — ABNORMAL HIGH (ref 100–199)
HDL: 63 mg/dL (ref >39)
LDL Chol Calc (NIH): 130 mg/dL — ABNORMAL HIGH (ref 0–99)
Triglycerides: 75 mg/dL (ref 0–149)
VLDL Cholesterol Cal: 13 mg/dL (ref 5–40)

## 2022-06-14 LAB — THYROID PANEL WITH TSH
Free Thyroxine Index: 3.4 (ref 1.2–4.9)
T3 Uptake Ratio: 30 % (ref 24–39)
T4, Total: 11.3 ug/dL (ref 4.5–12.0)
TSH: 0.825 u[IU]/mL (ref 0.450–4.500)

## 2022-06-14 LAB — VITAMIN B12: Vitamin B-12: 331 pg/mL (ref 232–1245)

## 2022-06-14 LAB — VITAMIN D 25 HYDROXY (VIT D DEFICIENCY, FRACTURES): Vit D, 25-Hydroxy: 17.9 ng/mL — ABNORMAL LOW (ref 30.0–100.0)

## 2022-07-20 ENCOUNTER — Other Ambulatory Visit: Payer: Self-pay | Admitting: Nurse Practitioner

## 2022-07-20 DIAGNOSIS — Z1231 Encounter for screening mammogram for malignant neoplasm of breast: Secondary | ICD-10-CM

## 2022-08-02 DIAGNOSIS — R0602 Shortness of breath: Secondary | ICD-10-CM | POA: Diagnosis not present

## 2022-08-02 DIAGNOSIS — J069 Acute upper respiratory infection, unspecified: Secondary | ICD-10-CM | POA: Diagnosis not present

## 2022-08-05 DIAGNOSIS — J069 Acute upper respiratory infection, unspecified: Secondary | ICD-10-CM | POA: Diagnosis not present

## 2022-09-15 ENCOUNTER — Ambulatory Visit
Admission: RE | Admit: 2022-09-15 | Discharge: 2022-09-15 | Disposition: A | Discharge status: Home / Self Care | Payer: BC Managed Care – PPO | Source: Ambulatory Visit | Attending: Nurse Practitioner | Admitting: Nurse Practitioner

## 2022-09-15 DIAGNOSIS — Z1231 Encounter for screening mammogram for malignant neoplasm of breast: Secondary | ICD-10-CM | POA: Diagnosis not present

## 2022-10-05 ENCOUNTER — Emergency Department (HOSPITAL_COMMUNITY)
Admission: EM | Admit: 2022-10-05 | Discharge: 2022-10-06 | Disposition: A | Discharge status: Home / Self Care | Payer: BC Managed Care – PPO | Attending: Emergency Medicine | Admitting: Emergency Medicine

## 2022-10-05 DIAGNOSIS — J449 Chronic obstructive pulmonary disease, unspecified: Secondary | ICD-10-CM | POA: Diagnosis not present

## 2022-10-05 DIAGNOSIS — Z79890 Hormone replacement therapy (postmenopausal): Secondary | ICD-10-CM | POA: Diagnosis not present

## 2022-10-05 DIAGNOSIS — R42 Dizziness and giddiness: Secondary | ICD-10-CM | POA: Diagnosis not present

## 2022-10-05 DIAGNOSIS — D72829 Elevated white blood cell count, unspecified: Secondary | ICD-10-CM | POA: Diagnosis not present

## 2022-10-05 DIAGNOSIS — R040 Epistaxis: Secondary | ICD-10-CM

## 2022-10-05 DIAGNOSIS — Z5321 Procedure and treatment not carried out due to patient leaving prior to being seen by health care provider: Secondary | ICD-10-CM | POA: Insufficient documentation

## 2022-10-05 DIAGNOSIS — E039 Hypothyroidism, unspecified: Secondary | ICD-10-CM | POA: Insufficient documentation

## 2022-10-05 DIAGNOSIS — Z91010 Allergy to peanuts: Secondary | ICD-10-CM | POA: Insufficient documentation

## 2022-10-05 DIAGNOSIS — I10 Essential (primary) hypertension: Secondary | ICD-10-CM | POA: Diagnosis not present

## 2022-10-05 DIAGNOSIS — R000 Tachycardia, unspecified: Secondary | ICD-10-CM | POA: Diagnosis not present

## 2022-10-06 ENCOUNTER — Telehealth: Payer: Self-pay

## 2022-10-06 ENCOUNTER — Other Ambulatory Visit: Payer: Self-pay

## 2022-10-06 ENCOUNTER — Encounter (HOSPITAL_COMMUNITY): Payer: Self-pay | Admitting: Emergency Medicine

## 2022-10-06 ENCOUNTER — Other Ambulatory Visit: Payer: Self-pay | Admitting: Nurse Practitioner

## 2022-10-06 DIAGNOSIS — R040 Epistaxis: Secondary | ICD-10-CM | POA: Insufficient documentation

## 2022-10-06 DIAGNOSIS — Z5321 Procedure and treatment not carried out due to patient leaving prior to being seen by health care provider: Secondary | ICD-10-CM | POA: Insufficient documentation

## 2022-10-06 LAB — CBC
HCT: 32.1 % — ABNORMAL LOW (ref 36.0–46.0)
Hemoglobin: 10.3 g/dL — ABNORMAL LOW (ref 12.0–15.0)
MCH: 26.5 pg (ref 26.0–34.0)
MCHC: 32.1 g/dL (ref 30.0–36.0)
MCV: 82.5 fL (ref 80.0–100.0)
Platelets: 288 10*3/uL (ref 150–400)
RBC: 3.89 MIL/uL (ref 3.87–5.11)
RDW: 17.1 % — ABNORMAL HIGH (ref 11.5–15.5)
WBC: 12.1 10*3/uL — ABNORMAL HIGH (ref 4.0–10.5)
nRBC: 0 % (ref 0.0–0.2)

## 2022-10-06 LAB — BASIC METABOLIC PANEL
Anion gap: 9 (ref 5–15)
BUN: 13 mg/dL (ref 6–20)
CO2: 25 mmol/L (ref 22–32)
Calcium: 8.9 mg/dL (ref 8.9–10.3)
Chloride: 103 mmol/L (ref 98–111)
Creatinine, Ser: 0.54 mg/dL (ref 0.44–1.00)
GFR, Estimated: >60 mL/min (ref >60)
Glucose, Bld: 128 mg/dL — ABNORMAL HIGH (ref 70–99)
Potassium: 3.6 mmol/L (ref 3.5–5.1)
Sodium: 137 mmol/L (ref 135–145)

## 2022-10-06 MED ORDER — LIDOCAINE-EPINEPHRINE (PF) 2 %-1:200000 IJ SOLN
10.0000 mL | Freq: Once | INTRAMUSCULAR | Status: AC
  Administered 2022-10-06: 10 mL
  Filled 2022-10-06: qty 20

## 2022-10-06 NOTE — ED Notes (Signed)
Patient arrived with active nose bleed, Dr. Shellee Milo notified

## 2022-10-06 NOTE — ED Triage Notes (Signed)
Patient arrived from home with sudden onset of nose bleed (no Hx of nose bleed), started around 2200 tonight, with BP-200/140 (No Hx of HTN). Patient also complaints of lightheadness since Tuesday.

## 2022-10-06 NOTE — ED Provider Notes (Signed)
[facility] Provider Note   CSN: [id number] Arrival date & time: 10/05/22  2357     History  Chief Complaint  Patient presents with   Epistaxis    Hypertension    Dana Martin is a 57 y.o. female who presents via EMS with epistaxis that started around 10 PM tonight.  No history of nosebleeds.  States that it was "gushing blood" from the right nostril primarily, mild lightheadedness since that time.  BP 200/140 per EMS.  No history of hypertension.  She is not anticoagulated no history of bleeding or clotting disorder.  History of hypothyroidism, PVCs.  GERD.  COPD.  HPI     Home Medications Prior to Admission medications   Medication Sig Start Date End Date Taking? Authorizing Provider  benzonatate (TESSALON PERLES) 100 MG capsule Take 1 capsule (100 mg total) by mouth every 6 (six) hours as needed for cough. 05/03/22 05/03/23  [name], FNP  buPROPion (WELLBUTRIN SR) 100 MG 12 hr tablet Take 1 tablet (100 mg total) by mouth daily. 08/19/20   [name], FNP  Cholecalciferol (VITAMIN D3) 25 MCG (1000 UT) CAPS Take by mouth. daily    [provider]  diphenhydrAMINE (BENADRYL) 25 MG tablet Take 25-50 mg by mouth daily as needed for itching (allergic reaction).     [provider]  famotidine (PEPCID) 10 MG tablet Take 10 mg by mouth daily.    [provider]  fluticasone (FLONASE ALLERGY RELIEF) 50 MCG/ACT nasal spray Place 2 sprays into both nostrils daily. 05/03/22   [name], FNP  ibuprofen (ADVIL) 200 MG tablet Take 400-800 mg by mouth every 6 (six) hours as needed for headache or moderate pain.    [provider]  ibuprofen (ADVIL) 800 MG tablet TAKE 1 TABLET BY MOUTH EVERY 6 HOURS AS NEEDED. 09/07/21   Felipa Furnace, DPM  levocetirizine (XYZAL) 5 MG tablet Take 5 mg by mouth at bedtime.    [provider]  Polyethyl Glycol-Propyl Glycol 0.4-0.3 % SOLN Place 1 drop into both  eyes 5 (five) times daily as needed (dry eyes/irriation).    [provider]  promethazine-dextromethorphan (PROMETHAZINE-DM) 6.25-15 MG/5ML syrup Take 5 mLs by mouth 4 (four) times daily as needed for cough. 05/10/22   [name], FNP  SYNTHROID 175 MCG tablet TAKE 1 TABLET BY MOUTH EVERY DAY 04/04/22   [name], FNP  Vitamin D, Ergocalciferol, (DRISDOL) 1.25 MG (50000 UNIT) CAPS capsule Take 1 capsule (50,000 Units total) by mouth 2 (two) times a week. 12/16/21   [name], FNP      Allergies    Fish allergy, Gadolinium derivatives, Multihance [gadobenate], Omnipaque [iohexol], Other, Peanut-containing drug products, Shellfish allergy, Codeine, Hydrocodone-acetaminophen, Oxycodone, and Tramadol    Review of Systems   Review of Systems  Constitutional: Negative.   HENT:  Positive for nosebleeds.   Eyes: Negative.   Respiratory: Negative.    Cardiovascular: Negative.   Gastrointestinal: Negative.   Neurological:  Positive for light-headedness.    Physical Exam Updated Vital Signs BP 112/88   Pulse 95   Temp 97.6 F (36.4 C) (Oral)   Resp (!) 28   SpO2 95%  Physical Exam Vitals and nursing note reviewed.  Constitutional:      Appearance: She is not ill-appearing or toxic-appearing.  HENT:     Head: Normocephalic and atraumatic.     Nose:     Right Nostril: Epistaxis present. No septal hematoma.  Left Nostril: Epistaxis present. No septal hematoma or occlusion.     Comments: Evidence of blood in bilateral naris, however persistent bleeding from the right naris, patient spitting out blood small blood clots.  Just a trickle of blood, no significant hemorrhage at time of my evaluation.  Excoriation and erythema of the proximal R nasal septum, oozing blood    Mouth/Throat:     Mouth: Mucous membranes are moist.     Pharynx: No oropharyngeal exudate or posterior oropharyngeal erythema.  Eyes:     General:        Right eye: No discharge.        Left eye: No  discharge.     Conjunctiva/sclera: Conjunctivae normal.  Cardiovascular:     Rate and Rhythm: Normal rate and regular rhythm.     Pulses: Normal pulses.     Heart sounds: Normal heart sounds. No murmur heard. Pulmonary:     Effort: Pulmonary effort is normal. No respiratory distress.     Breath sounds: Normal breath sounds. No wheezing or rales.  Abdominal:     General: Bowel sounds are normal. There is no distension.     Palpations: Abdomen is soft.     Tenderness: There is no abdominal tenderness.  Musculoskeletal:        General: No deformity.     Cervical back: Neck supple.  Skin:    General: Skin is warm and dry.     Capillary Refill: Capillary refill takes less than 2 seconds.  Neurological:     General: No focal deficit present.     Mental Status: She is alert and oriented to person, place, and time. Mental status is at baseline.  Psychiatric:        Mood and Affect: Mood normal.     ED Results / Procedures / Treatments   Labs (all labs ordered are listed, but only abnormal results are displayed) Labs Reviewed  CBC - Abnormal; Notable for the following components:      Result Value   WBC 12.1 (*)    Hemoglobin 10.3 (*)    HCT 32.1 (*)    RDW 17.1 (*)    All other components within normal limits  BASIC METABOLIC PANEL - Abnormal; Notable for the following components:   Glucose, Bld 128 (*)    All other components within normal limits    EKG None  Radiology No results found.  Procedures .Epistaxis Management  Date/Time: 10/06/2022 2:45 AM  Performed by: Emeline Darling, PA-C Authorized by: Emeline Darling, PA-C   Consent:    Consent obtained:  Verbal   Consent given by:  Patient   Risks, benefits, and alternatives were discussed: yes     Risks discussed:  Bleeding, nasal injury, pain and infection   Alternatives discussed:  Alternative treatment and delayed treatment Universal protocol:    Patient identity confirmed:  Verbally with  patient Anesthesia:    Anesthesia method:  None Procedure details:    Treatment site:  R anterior   Treatment method:  Anterior pack (Rhino Rocket)   Treatment complexity:  Limited   Treatment episode: recurring   Post-procedure details:    Assessment:  Bleeding stopped   Procedure completion:  Tolerated well, no immediate complications Comments:     Attempted x 2, initial 3.5 Rhino Rocket dislodged with patient coughing, expelled. Recurrent bleed. Repacking with 4.5 rapid rhino rocket with hemostasis.      Medications Ordered in ED Medications  lidocaine-EPINEPHrine (XYLOCAINE W/EPI) 2 %-1:200000 (  PF) injection 10 mL (10 mLs Other Given 10/06/22 0124)    ED Course/ Medical Decision Making/ A&P Clinical Course as of 10/06/22 0416  Thu Oct 06, 2022  0240 4.5 Rhino Rocket used for repacking of the right naris with hemostasis.  Balloon inflated with 3 cc of air.  [RS]  [phone] Patient states that the nasal packing made her sneeze multiple times, and ultimately resulted in the repeat expulsion of the nasal packing around 4 am. No recurrent bleeding at this time. Offered repeat nasal packing, patient declined, prefers discharge at this time. Feel this is reasonable given reassuring labs and hemodynamics.  [RS]    Clinical Course User Index [RS] Taahir Grisby, Gypsy Balsam, PA-C                             Medical Decision Making 57 year old female who  Presents with concern for nosebleed.  Exquisitely hypertensive on intake, though this quickly down trended within normal range at this time, as patient has become less anxious.  Mildly tachycardic on intake, tachycardia resolved at this time.  Neurologic exam is nonfocal.  Patient with persistent trickle of blood from the right naris, no significant hemorrhage.  On physical exam there appears to be excoriation of the right anterior septum, presentation most consistent with anterior nasal bleed.  Amount and/or Complexity of Data Reviewed Labs:  ordered.    Details: CBC with mild leukocytosis of 12,000, hemoglobin of 10.3, baseline near 11.  BMP unremarkable.  Risk Prescription drug management.     Afrin administered without resolution of bleeding, lidocaine with epi soaked pledgets packed in the nose with temporary resolution of bleeding, however with removal of pledgets patient very quickly revealed recurrent bleed.  Rhino Rocket placed with 3 cc of air with hemostasis, however subsequently patient coughed and blew her nose expelling the [employer].  She remained hemostatic for approximately 30 minutes, however after ambulating to the restroom and back to the bedside patient had recurrent slow bleed from the right naris.  4.5 Rhino Rocket used for repacking of the right naris with hemostasis.  Balloon inflated with 3 cc of air.   New packing fell out after an hour after the patient continued to sneeze and rub her nose.  Fortunately no rebleeding occurred and the patient opted to avoid repeat packing at this time.  Will provide information for ENT follow-up.  She does have an appointment with [facility]NT on 3/25 for hearing concerns, encouraged her to call sooner for ER follow-up for epistaxis requiring packing.  Precautions for rebleed discussed with patient including but not limited to avoiding blowing her nose, refraining from sticking anything in the nose.  No further workup warranted in ER at this time.  Dana Martin voiced understanding of her medical evaluation and treatment plan. Each of their questions answered to their expressed satisfaction.  Return precautions were given.  Patient is well-appearing, stable, and was discharged in good condition.  This chart was dictated using voice recognition software, Dragon. Despite the best efforts of this provider to proofread and correct errors, errors may still occur which can change documentation meaning.   Final Clinical Impression(s) / ED Diagnoses Final diagnoses:  Epistaxis     Rx / DC Orders ED Discharge Orders     None         Dana Martin 10/06/22 0416    Fatima Blank, MD 10/06/22 [phone]

## 2022-10-06 NOTE — Transitions of Care (Post Inpatient/ED Visit) (Signed)
   10/06/2022  Name: Dana Martin MRN: [id number] DOB: 1965/09/23  Today's TOC FU Call Status: Today's TOC FU Call Status:: Successful TOC FU Call Competed TOC FU Call Complete Date: 10/06/22  Transition Care Management Follow-up Telephone Call Date of Discharge: 10/06/22 Discharge Facility: [facility]) Type of Discharge: Inpatient Admission Primary Inpatient Discharge Diagnosis:: Epistaxis How have you been since you were released from the hospital?: Better Any questions or concerns?: No  Items Reviewed: Did you receive and understand the discharge instructions provided?: Yes Medications obtained and verified?: Yes (Medications Reviewed) Any new allergies since your discharge?: No Dietary orders reviewed?: No Do you have support at home?: Yes People in Home: spouse  Home Care and Equipment/Supplies: [place] Ordered?: No Any new equipment or medical supplies ordered?: No  Functional Questionnaire: Do you need assistance with bathing/showering or dressing?: No Do you need assistance with meal preparation?: No Do you need assistance with eating?: No Do you have difficulty maintaining continence: No Do you need assistance with getting out of bed/getting out of a chair/moving?: No Do you have difficulty managing or taking your medications?: No  Folllow up appointments reviewed: PCP Follow-up appointment confirmed?: No MD Provider Line Number:[phone] Given: Yes [facility] Follow-up appointment confirmed?: NA Do you need transportation to your follow-up appointment?: No Do you understand care options if your condition(s) worsen?: Yes-patient verbalized understanding    SIGNATURE Tami Lin

## 2022-10-06 NOTE — Discharge Instructions (Addendum)
You were seen in the ER today for your nose bleed. Fortunately it was stopped in the ED. Your nasal packing has fallen out and you opted to avoid repacking given no recurrent bleed. Please follow up with the ENT listed below - call their office today for an appointment for ER follow up for nosebleed that required packing. Return to the ER with any new severe symptoms.

## 2022-10-07 ENCOUNTER — Encounter: Payer: Self-pay | Admitting: Nurse Practitioner

## 2022-10-07 ENCOUNTER — Other Ambulatory Visit: Payer: Self-pay

## 2022-10-07 ENCOUNTER — Encounter (HOSPITAL_COMMUNITY): Payer: Self-pay

## 2022-10-07 ENCOUNTER — Emergency Department (HOSPITAL_COMMUNITY)
Admission: EM | Admit: 2022-10-07 | Discharge: 2022-10-07 | Disposition: A | Discharge status: Home / Self Care | Payer: BC Managed Care – PPO | Attending: Emergency Medicine | Admitting: Emergency Medicine

## 2022-10-07 ENCOUNTER — Emergency Department (HOSPITAL_COMMUNITY)
Admission: EM | Admit: 2022-10-07 | Discharge: 2022-10-07 | Discharge status: Against Medical Advice | Payer: BC Managed Care – PPO | Source: Home / Self Care

## 2022-10-07 ENCOUNTER — Ambulatory Visit: Payer: BC Managed Care – PPO | Admitting: Nurse Practitioner

## 2022-10-07 VITALS — BP 118/82 | HR 88 | Temp 99.4°F | Ht 65.0 in | Wt 197.0 lb

## 2022-10-07 DIAGNOSIS — R040 Epistaxis: Secondary | ICD-10-CM

## 2022-10-07 DIAGNOSIS — Z91010 Allergy to peanuts: Secondary | ICD-10-CM | POA: Insufficient documentation

## 2022-10-07 DIAGNOSIS — R030 Elevated blood-pressure reading, without diagnosis of hypertension: Secondary | ICD-10-CM

## 2022-10-07 HISTORY — DX: Epistaxis: R04.0

## 2022-10-07 NOTE — Discharge Instructions (Signed)
1.  There are various moisturizing nasal sprays that you may use over-the-counter.  They are described as a saline nasal mist.  These can help moisturize inside of your nose if dryness is contributing to easy bleeding. 2.  Your bleeding is coming from of very small area toward the front of the nose.  This should heal on its own.  If you have bleeding, pinch your nose firmly.  Try at all times to avoid wiping the nose.  If your nose drips blood or moisture, dab it from your lip and not from the nasal passage. 3.  May follow-up with your doctor for continued monitoring.  At this time you do not need a nasal packing.

## 2022-10-07 NOTE — ED Provider Notes (Signed)
[facility] Provider Note   CSN: [id number] Arrival date & time: 10/07/22  1125     History  Chief Complaint  Patient presents with   Epistaxis    Dana Martin is a 57 y.o. female.  HPI Patient has continued to have some sporadic bleeding from the right nare since being seen in the emergency department 3 days ago.  She was seen at her PCP office today and started having bleeding and was recommended to come to the emergency department.  She reports occasionally there have been clots that she has blown out and also she could feel it running down the back of her throat.    Home Medications Prior to Admission medications   Medication Sig Start Date End Date Taking? Authorizing Provider  buPROPion (WELLBUTRIN SR) 100 MG 12 hr tablet Take 1 tablet (100 mg total) by mouth daily. 08/19/20   [name], FNP  Cholecalciferol (VITAMIN D3) 25 MCG (1000 UT) CAPS Take by mouth. daily    [provider]  diphenhydrAMINE (BENADRYL) 25 MG tablet Take 25-50 mg by mouth daily as needed for itching (allergic reaction).     [provider]  famotidine (PEPCID) 10 MG tablet Take 10 mg by mouth daily.    [provider]  fluticasone (FLONASE ALLERGY RELIEF) 50 MCG/ACT nasal spray Place 2 sprays into both nostrils daily. 05/03/22   [name], FNP  ibuprofen (ADVIL) 200 MG tablet Take 400-800 mg by mouth every 6 (six) hours as needed for headache or moderate pain.    [provider]  ibuprofen (ADVIL) 800 MG tablet TAKE 1 TABLET BY MOUTH EVERY 6 HOURS AS NEEDED. 09/07/21   Felipa Furnace, DPM  levocetirizine (XYZAL) 5 MG tablet Take 5 mg by mouth at bedtime.    [provider]  Polyethyl Glycol-Propyl Glycol 0.4-0.3 % SOLN Place 1 drop into both eyes 5 (five) times daily as needed (dry eyes/irriation).    [provider]  SYNTHROID 175 MCG tablet TAKE 1 TABLET BY MOUTH EVERY Dana 10/06/22   [name],  FNP  Vitamin D, Ergocalciferol, (DRISDOL) 1.25 MG (50000 UNIT) CAPS capsule Take 1 capsule (50,000 Units total) by mouth 2 (two) times a week. 12/16/21   [name], FNP      Allergies    Fish allergy, Gadolinium derivatives, Multihance [gadobenate], Omnipaque [iohexol], Other, Peanut-containing drug products, Shellfish allergy, Codeine, Hydrocodone-acetaminophen, Oxycodone, and Tramadol    Review of Systems   Review of Systems  Physical Exam Updated Vital Signs BP (!) 142/99   Pulse 89   Temp 98.7 F (37.1 C)   Resp 19   Ht '5\' 5"'$  (1.651 m)   Wt 83.9 kg   SpO2 100%   BMI 30.79 kg/m  Physical Exam Constitutional:      Comments: Alert and well in appearance.  HENT:     Head: Normocephalic and atraumatic.     Nose:     Comments: Near on the right side is patent to the posterior nasopharynx.  There is no pooling blood or clot.  Patient is dabbed away a very small translucent amount of blood from the anterior nare.  I cannot see any active rebleeding.  There is an area of minor excoriation on the septum suspicious for area of bleeding.  I have rechecked it on multiple times and cannot identify any active bleeding.    Mouth/Throat:     Mouth: Mucous membranes are moist.  Pharynx: Oropharynx is clear. No oropharyngeal exudate or posterior oropharyngeal erythema.     Comments: Posterior oropharynx is widely patent.  There is no streaking of blood or clot in the posterior nasopharynx. Eyes:     Extraocular Movements: Extraocular movements intact.  Musculoskeletal:     Cervical back: Neck supple.  Skin:    General: Skin is warm and dry.  Neurological:     General: No focal deficit present.     Mental Status: She is oriented to person, place, and time.     ED Results / Procedures / Treatments   Labs (all labs ordered are listed, but only abnormal results are displayed) Labs Reviewed - No data to display  EKG None  Radiology No results found.  Procedures Procedures     Medications Ordered in ED Medications - No data to display  ED Course/ Medical Decision Making/ A&P                             Medical Decision Making  Patient was seen 3 days ago for anterior nosebleed.  She reports it has decreased since that time but she is still getting some sporadic bleeding.  At this time there is no bleeding and area involved appears to be the anterior septum.  No indication for packing at this time.  I have extensively counseled patient on home management.        Final Clinical Impression(s) / ED Diagnoses Final diagnoses:  Anterior epistaxis    Rx / DC Orders ED Discharge Orders     None         Charlesetta Shanks, MD 10/07/22 1224

## 2022-10-07 NOTE — ED Notes (Signed)
Patient advised triage RN and NT that she is leaving . Epistaxis stopped.

## 2022-10-07 NOTE — ED Triage Notes (Signed)
Pt came in via POV d/t a nosebleed that began about an hr ago. She states this happened a few das ago when she came in and ENT had to come to get it to stop, this was associated with HTN then & this time in triage her BP was 142/99. A/Ox4, denies pain.

## 2022-10-07 NOTE — Progress Notes (Signed)
I,Sheena H Holbrook,acting as a Education administrator for [name], FNP.,have documented all relevant documentation on the behalf of [name], FNP,as directed by  [name], FNP while in the presence of [name], [place].    Subjective:     Patient ID: Dana Martin , female    DOB: 1965-11-02 , 57 y.o.   MRN: [id number]   Chief Complaint  Patient presents with   Epistaxis    HPI  Patient presents today for ED follow up. Patient seen in ED for epistaxis, patient noted to be hypertensive at 200/140 by EMS. Started after reaching over to pick up tissues on floor and noticed her nose was running. Prior she was a little lightheaded.  She did have to get her nares packed and one packing dislodged and was repacked. Patient reports recurrent epistaxis this morning that occurred last night while she was sleeping, she went to ED again but left once there was no more bleeding. Patient reports the bleeding returned on her way home and took over an hour to stop. Patient not currently having epistaxis at this time.       Past Medical History:  Diagnosis Date   Angio-edema    COPD (chronic obstructive pulmonary disease) ([facility])    denies SOB with daily activities   Full dentures    GERD (gastroesophageal reflux disease)    History of pancreatitis    Hypothyroidism    Nasal sinus congestion 05/08/2014   with sinus drainage   Pancreatitis    PVC (premature ventricular contraction) 07/07/2017   Stress incontinence    Symptomatic cholelithiasis 05/2014   Urticaria    Varicose veins of bilateral lower extremities with pain    Wears dentures    upper and lower   Wears glasses      Family History  Problem Relation Age of Onset   Hypertension Mother    Atrial fibrillation Mother    Heart attack Mother    Hypertension Sister    Hypertension Brother    Hypertension Daughter    Lung cancer Brother    Diabetes Sister      Current Outpatient Medications:    buPROPion (WELLBUTRIN SR) 100 MG  12 hr tablet, Take 1 tablet (100 mg total) by mouth daily., Disp: 90 tablet, Rfl: 1   Cholecalciferol (VITAMIN D3) 25 MCG (1000 UT) CAPS, Take by mouth. daily, Disp: , Rfl:    diphenhydrAMINE (BENADRYL) 25 MG tablet, Take 25-50 mg by mouth daily as needed for itching (allergic reaction). , Disp: , Rfl:    famotidine (PEPCID) 10 MG tablet, Take 10 mg by mouth daily., Disp: , Rfl:    fluticasone (FLONASE ALLERGY RELIEF) 50 MCG/ACT nasal spray, Place 2 sprays into both nostrils daily., Disp: 16 g, Rfl: 2   ibuprofen (ADVIL) 200 MG tablet, Take 400-800 mg by mouth every 6 (six) hours as needed for headache or moderate pain., Disp: , Rfl:    ibuprofen (ADVIL) 800 MG tablet, TAKE 1 TABLET BY MOUTH EVERY 6 HOURS AS NEEDED., Disp: 60 tablet, Rfl: 1   levocetirizine (XYZAL) 5 MG tablet, Take 5 mg by mouth at bedtime., Disp: , Rfl:    Polyethyl Glycol-Propyl Glycol 0.4-0.3 % SOLN, Place 1 drop into both eyes 5 (five) times daily as needed (dry eyes/irriation)., Disp: , Rfl:    SYNTHROID 175 MCG tablet, TAKE 1 TABLET BY MOUTH EVERY DAY, Disp: 90 tablet, Rfl: 1   Vitamin D, Ergocalciferol, (DRISDOL) 1.25 MG (50000 UNIT) CAPS capsule, Take 1 capsule (  50,000 Units total) by mouth 2 (two) times a week., Disp: 24 capsule, Rfl: 1   Allergies  Allergen Reactions   Fish Allergy Hives   Gadolinium Derivatives Hives, Itching and Rash    Pt stated she felt itching and burning in her throat immediately following the injection.   Multihance [Gadobenate] Hives, Itching and Rash    Itching and burning in her throat. Hives and itching of her skin.   Omnipaque [Iohexol] Hives   Other Nausea And Vomiting    ALL NARCOTICS   Peanut-Containing Drug Products Hives   Shellfish Allergy Hives   Codeine Nausea And Vomiting   Hydrocodone-Acetaminophen Other (See Comments)   Oxycodone Nausea And Vomiting   Tramadol     headaches     Review of Systems  Constitutional: Negative.   HENT:  Positive for nosebleeds and sinus  pressure.   Respiratory: Negative.    Cardiovascular: Negative.   Neurological: Negative.   Psychiatric/Behavioral: Negative.       Today's Vitals   10/07/22 1006  BP: 118/82  Pulse: 88  Temp: 99.4 F (37.4 C)  TempSrc: Oral  SpO2: 97%  Weight: 197 lb (89.4 kg)  Height: 5\' 5"  (1.651 m)   Body mass index is 32.78 kg/m.   Objective:  Physical Exam Vitals reviewed.  Constitutional:      General: She is not in acute distress.    Appearance: Normal appearance. She is obese.  HENT:     Ears:     Comments: Left ear has some wax build up but not significant.     Nose:     Comments: Bright red blood from nares.  Cardiovascular:     Rate and Rhythm: Normal rate and regular rhythm.     Pulses: Normal pulses.     Heart sounds: Normal heart sounds. No murmur heard. Pulmonary:     Effort: Pulmonary effort is normal. No respiratory distress.     Breath sounds: Normal breath sounds. No wheezing.  Neurological:     General: No focal deficit present.     Mental Status: She is alert and oriented to person, place, and time.     Cranial Nerves: No cranial nerve deficit.     Motor: No weakness.  Psychiatric:        Mood and Affect: Mood normal.        Behavior: Behavior normal.        Thought Content: Thought content normal.        Judgment: Judgment normal.         Assessment And Plan:     1. Epistaxis, recurrent Comments: Nose bleed was controlled until she was leaving the exam room then had bright red blood. Called to [facility]NT and since was bleeding she was advised to go to ER and f/u with ENT after visit once controlled. - Ambulatory referral to ENT - CBC with Differential/Platelet  2. Elevated blood pressure reading Comments: Significant elevated blood pressure in ER, now back to normal.     Patient was given opportunity to ask questions. Patient verbalized understanding of the plan and was able to repeat key elements of the plan. All questions were answered  to their satisfaction.  [name], FNP   I, [name], FNP, have reviewed all documentation for this visit. The documentation on 10/07/22 for the exam, diagnosis, procedures, and orders are all accurate and complete.   IF YOU HAVE BEEN REFERRED TO A SPECIALIST, IT MAY TAKE 1-2 WEEKS TO SCHEDULE/PROCESS  THE REFERRAL. IF YOU HAVE NOT HEARD FROM US/SPECIALIST IN TWO WEEKS, PLEASE GIVE [place] A CALL AT [phone].   THE PATIENT IS ENCOURAGED TO PRACTICE SOCIAL DISTANCING DUE TO THE COVID-19 PANDEMIC.

## 2022-10-07 NOTE — ED Triage Notes (Signed)
Patient reports recurrent right epistaxis onset last night , seen here and was discharged . Denies injury or anticoagulant medication .

## 2022-10-10 DIAGNOSIS — R040 Epistaxis: Secondary | ICD-10-CM | POA: Diagnosis not present

## 2022-10-10 LAB — CBC WITH DIFFERENTIAL/PLATELET
Basophils Absolute: 0.1 10*3/uL (ref 0.0–0.2)
Basos: 1 %
EOS (ABSOLUTE): 0.2 10*3/uL (ref 0.0–0.4)
Eos: 2 %
Hematocrit: 31.5 % — ABNORMAL LOW (ref 34.0–46.6)
Hemoglobin: 9.7 g/dL — ABNORMAL LOW (ref 11.1–15.9)
Immature Grans (Abs): 0 10*3/uL (ref 0.0–0.1)
Immature Granulocytes: 0 %
Lymphocytes Absolute: 2.3 10*3/uL (ref 0.7–3.1)
Lymphs: 29 %
MCH: 25.5 pg — ABNORMAL LOW (ref 26.6–33.0)
MCHC: 30.8 g/dL — ABNORMAL LOW (ref 31.5–35.7)
MCV: 83 fL (ref 79–97)
Monocytes Absolute: 0.5 10*3/uL (ref 0.1–0.9)
Monocytes: 6 %
Neutrophils Absolute: 5 10*3/uL (ref 1.4–7.0)
Neutrophils: 62 %
Platelets: 335 10*3/uL (ref 150–450)
RBC: 3.81 x10E6/uL (ref 3.77–5.28)
RDW: 16.2 % — ABNORMAL HIGH (ref 11.7–15.4)
WBC: 8.1 10*3/uL (ref 3.4–10.8)

## 2022-10-31 DIAGNOSIS — H903 Sensorineural hearing loss, bilateral: Secondary | ICD-10-CM | POA: Insufficient documentation

## 2022-12-19 ENCOUNTER — Encounter: Payer: Self-pay | Admitting: Nurse Practitioner

## 2022-12-19 ENCOUNTER — Ambulatory Visit (INDEPENDENT_AMBULATORY_CARE_PROVIDER_SITE_OTHER): Payer: BC Managed Care – PPO | Admitting: Nurse Practitioner

## 2022-12-19 VITALS — BP 120/80 | HR 82 | Temp 97.9°F | Ht 65.0 in | Wt 196.2 lb

## 2022-12-19 DIAGNOSIS — I700 Atherosclerosis of aorta: Secondary | ICD-10-CM | POA: Insufficient documentation

## 2022-12-19 DIAGNOSIS — E538 Deficiency of other specified B group vitamins: Secondary | ICD-10-CM | POA: Diagnosis not present

## 2022-12-19 DIAGNOSIS — Z0001 Encounter for general adult medical examination with abnormal findings: Secondary | ICD-10-CM | POA: Diagnosis not present

## 2022-12-19 DIAGNOSIS — R202 Paresthesia of skin: Secondary | ICD-10-CM | POA: Insufficient documentation

## 2022-12-19 DIAGNOSIS — Z720 Tobacco use: Secondary | ICD-10-CM

## 2022-12-19 DIAGNOSIS — E782 Mixed hyperlipidemia: Secondary | ICD-10-CM

## 2022-12-19 DIAGNOSIS — Z79899 Other long term (current) drug therapy: Secondary | ICD-10-CM | POA: Diagnosis not present

## 2022-12-19 DIAGNOSIS — M79671 Pain in right foot: Secondary | ICD-10-CM | POA: Insufficient documentation

## 2022-12-19 DIAGNOSIS — Z2821 Immunization not carried out because of patient refusal: Secondary | ICD-10-CM

## 2022-12-19 DIAGNOSIS — R200 Anesthesia of skin: Secondary | ICD-10-CM

## 2022-12-19 DIAGNOSIS — E039 Hypothyroidism, unspecified: Secondary | ICD-10-CM | POA: Diagnosis not present

## 2022-12-19 DIAGNOSIS — E559 Vitamin D deficiency, unspecified: Secondary | ICD-10-CM | POA: Diagnosis not present

## 2022-12-19 DIAGNOSIS — Z0000 Encounter for general adult medical examination without abnormal findings: Secondary | ICD-10-CM

## 2022-12-19 MED ORDER — GABAPENTIN 100 MG PO CAPS: 100.0000 mg | ORAL_CAPSULE | Freq: Every day | ORAL | 2 refills | Status: DC

## 2022-12-19 MED ORDER — VARENICLINE TARTRATE (STARTER) 0.5 MG X 11 & 1 MG X 42 PO TBPK: ORAL_TABLET | ORAL | 0 refills | Status: DC

## 2022-12-19 NOTE — Assessment & Plan Note (Signed)
Will check vitamin B12, has had injections in the past.

## 2022-12-19 NOTE — Assessment & Plan Note (Signed)
She is not currently on a statin

## 2022-12-19 NOTE — Patient Instructions (Signed)

## 2022-12-19 NOTE — Assessment & Plan Note (Signed)
Diet controlled, continue focusing on diet low in fat

## 2022-12-19 NOTE — Assessment & Plan Note (Signed)
Will check vitamin D level and supplement as needed.    Also encouraged to spend 15 minutes in the sun daily.   

## 2022-12-19 NOTE — Assessment & Plan Note (Signed)
This may be related to nerve damage, will refer to another foot doctor at patient request for a second opinion.

## 2022-12-19 NOTE — Assessment & Plan Note (Signed)
Controlled, continue current medications. Will check thyroid levels.

## 2022-12-19 NOTE — Progress Notes (Signed)
Dana Martin,acting as a Neurosurgeon for Arnette Felts, FNP.,have documented all relevant documentation on the behalf of Arnette Felts, FNP,as directed by  Arnette Felts, FNP while in the presence of Arnette Felts, FNP.   Subjective:     Patient ID: Dana Martin , female    DOB: 07-09-1966 , 57 y.o.   MRN: [id number]   Chief Complaint  Patient presents with   Annual Exam    HPI  Patient presents today for HM, Patient states compliance to medications and has no other concerns.   She has been taking ibuprofen nightly.   BP Readings from Last 3 Encounters: 12/19/22 : 120/80 10/07/22 : (!) 147/76 10/07/22 : 118/82       Past Medical History:  Diagnosis Date   Acute pancreatitis 12/06/2013   Adverse food reaction 09/04/2019   Angio-edema    COPD (chronic obstructive pulmonary disease) ([facility])    denies SOB with daily activities   Epistaxis, recurrent 10/07/2022   Full dentures    GERD (gastroesophageal reflux disease)    History of pancreatitis    Hypothyroidism    Nasal sinus congestion 05/08/2014   with sinus drainage   Pancreatitis    PVC (premature ventricular contraction) 07/07/2017   Stress incontinence    Symptomatic cholelithiasis 05/2014   Urticaria    Varicose veins of bilateral lower extremities with pain    Wears dentures    upper and lower   Wears glasses      Family History  Problem Relation Age of Onset   Hypertension Mother    Atrial fibrillation Mother    Heart attack Mother    Hypertension Sister    Hypertension Brother    Hypertension Daughter    Lung cancer Brother    Diabetes Sister      Current Outpatient Medications:    Cholecalciferol (VITAMIN D3) 25 MCG (1000 UT) CAPS, Take by mouth. daily, Disp: , Rfl:    diphenhydrAMINE (BENADRYL) 25 MG tablet, Take 25-50 mg by mouth daily as needed for itching (allergic reaction). , Disp: , Rfl:    famotidine (PEPCID) 10 MG tablet, Take 10 mg by mouth daily., Disp: , Rfl:    fluticasone (FLONASE  ALLERGY RELIEF) 50 MCG/ACT nasal spray, Place 2 sprays into both nostrils daily., Disp: 16 g, Rfl: 2   gabapentin (NEURONTIN) 100 MG capsule, Take 1 capsule (100 mg total) by mouth at bedtime., Disp: 30 capsule, Rfl: 2   ibuprofen (ADVIL) 200 MG tablet, Take 400-800 mg by mouth every 6 (six) hours as needed for headache or moderate pain., Disp: , Rfl:    ibuprofen (ADVIL) 800 MG tablet, TAKE 1 TABLET BY MOUTH EVERY 6 HOURS AS NEEDED., Disp: 60 tablet, Rfl: 1   levocetirizine (XYZAL) 5 MG tablet, Take 5 mg by mouth at bedtime., Disp: , Rfl:    Polyethyl Glycol-Propyl Glycol 0.4-0.3 % SOLN, Place 1 drop into both eyes 5 (five) times daily as needed (dry eyes/irriation)., Disp: , Rfl:    SYNTHROID 175 MCG tablet, TAKE 1 TABLET BY MOUTH EVERY DAY, Disp: 90 tablet, Rfl: 1   Varenicline Tartrate, Starter, (CHANTIX STARTING MONTH PAK) 0.5 MG X 11 & 1 MG X 42 TBPK, Take as directed, Disp: 53 each, Rfl: 0   Allergies  Allergen Reactions   Fish Allergy Hives   Gadolinium Derivatives Hives, Itching and Rash    Pt stated she felt itching and burning in her throat immediately following the injection.   Multihance [Gadobenate] Hives, Itching and Rash  Itching and burning in her throat. Hives and itching of her skin.   Omnipaque [Iohexol] Hives   Other Nausea And Vomiting    ALL NARCOTICS   Peanut-Containing Drug Products Hives   Shellfish Allergy Hives   Codeine Nausea And Vomiting   Hydrocodone-Acetaminophen Other (See Comments)   Oxycodone Nausea And Vomiting   Tramadol     headaches      The patient states she is status post hysterectomy for birth control.  No LMP recorded. Patient has had a hysterectomy.  Negative for Dysmenorrhea and Negative for Menorrhagia. Negative for: breast discharge, breast lump(s), breast pain and breast self exam. Associated symptoms include abnormal vaginal bleeding. Pertinent negatives include abnormal bleeding (hematology), anxiety, decreased libido, depression,  difficulty falling sleep, dyspareunia, history of infertility, nocturia, sexual dysfunction, sleep disturbances, urinary incontinence, urinary urgency, vaginal discharge and vaginal itching. Diet regular.  The patient states her exercise level is minimal.   The patient's tobacco use is:  Social History   Tobacco Use  Smoking Status Every Day   Packs/day: 1.00   Years: 25.00   Additional pack years: 0.00   Total pack years: 25.00   Types: Cigarettes  Smokeless Tobacco Never  Tobacco Comments   quit for six years, started back 09-2018 - currently on wellbutrin for smoking cessation. 12-09-21 smoking 1/2 pack/day, 06/13/22 - down to 5-6 cigs/day  . She has been exposed to passive smoke. The patient's alcohol use is:  Social History   Substance and Sexual Activity  Alcohol Use Not Currently    Review of Systems  Constitutional: Negative.  Negative for fatigue.  HENT: Negative.    Eyes: Negative.   Respiratory: Negative.    Cardiovascular:  Negative for chest pain and palpitations.  Gastrointestinal: Negative.   Endocrine: Negative.   Genitourinary: Negative.   Musculoskeletal: Negative.   Skin: Negative.   Allergic/Immunologic: Negative.   Neurological: Negative.   Hematological: Negative.   Psychiatric/Behavioral: Negative.  The patient is not nervous/anxious.      Today's Vitals   12/19/22 0853  BP: 120/80  Pulse: 82  Temp: 97.9 F (36.6 C)  TempSrc: Oral  Weight: 196 lb 3.2 oz (89 kg)  Height: 5\' 5"  (1.651 m)  PainSc: 0-No pain   Body mass index is 32.65 kg/m.  Wt Readings from Last 3 Encounters:  12/19/22 196 lb 3.2 oz (89 kg)  10/07/22 185 lb (83.9 kg)  10/07/22 197 lb (89.4 kg)    Objective:  Physical Exam Vitals reviewed.  Constitutional:      General: She is not in acute distress.    Appearance: Normal appearance. She is well-developed. She is obese.  HENT:     Head: Normocephalic and atraumatic.     Right Ear: Hearing, tympanic membrane, ear canal  and external ear normal. There is no impacted cerumen.     Left Ear: Hearing, tympanic membrane, ear canal and external ear normal. There is no impacted cerumen.     Nose: Nose normal.     Mouth/Throat:     Mouth: Mucous membranes are moist.  Eyes:     General: Lids are normal.     Extraocular Movements: Extraocular movements intact.     Conjunctiva/sclera: Conjunctivae normal.     Pupils: Pupils are equal, round, and reactive to light.     Funduscopic exam:    Right eye: No papilledema.        Left eye: No papilledema.  Neck:     Thyroid: No thyroid mass.  Vascular: No carotid bruit.  Cardiovascular:     Rate and Rhythm: Normal rate and regular rhythm.     Pulses: Normal pulses.     Heart sounds: Normal heart sounds. No murmur heard. Pulmonary:     Effort: Pulmonary effort is normal. No respiratory distress.     Breath sounds: Normal breath sounds. No wheezing.  Abdominal:     General: Abdomen is flat. Bowel sounds are normal. There is no distension.     Palpations: Abdomen is soft. There is no mass.     Tenderness: There is no abdominal tenderness.  Musculoskeletal:        General: No swelling or tenderness. Normal range of motion.     Cervical back: Full passive range of motion without pain, normal range of motion and neck supple.     Right lower leg: No edema.     Left lower leg: No edema.  Skin:    General: Skin is warm and dry.     Capillary Refill: Capillary refill takes less than 2 seconds.  Neurological:     General: No focal deficit present.     Mental Status: She is alert and oriented to person, place, and time.     Cranial Nerves: No cranial nerve deficit.     Sensory: No sensory deficit.     Motor: No weakness.  Psychiatric:        Mood and Affect: Mood normal.        Behavior: Behavior normal.        Thought Content: Thought content normal.        Judgment: Judgment normal.         Assessment And Plan:     1. Encounter for general adult medical  examination w/o abnormal findings Behavior modifications discussed and diet history reviewed.   Pt will continue to exercise regularly and modify diet with low GI, plant based foods and decrease intake of processed foods.  Recommend intake of daily multivitamin, Vitamin D, and calcium.  Recommend mammogram and colonoscopy for preventive screenings, as well as recommend immunizations that include influenza, TDAP, and Shingles  2. Acquired hypothyroidism Comments: Thyroid levels are normal. Will check levels today. - TSH + free T4  3. Mixed hyperlipidemia Comments: Diet controlled, continue focusing on diet low in fat - CMP14+EGFR - Lipid panel  4. Vitamin D deficiency Comments: Will check vitamin D level and supplement as needed.    Also encouraged to spend 15 minutes in the sun daily. - Vitamin D (25 hydroxy)  5. Vitamin B12 deficiency Comments: Will check vitamin B12, has had injections in the past. - Vitamin B12  6. Right foot pain - Ambulatory referral to Podiatry - gabapentin (NEURONTIN) 100 MG capsule; Take 1 capsule (100 mg total) by mouth at bedtime.  Dispense: 30 capsule; Refill: 2  7. Atherosclerosis of aorta ([facility]) Comments: She is not currently on a statin  8. Numbness and tingling of foot Comments: This may be related to nerve damage, will refer to another foot doctor at patient request for a second opinion. - gabapentin (NEURONTIN) 100 MG capsule; Take 1 capsule (100 mg total) by mouth at bedtime.  Dispense: 30 capsule; Refill: 2  9. Tobacco abuse - CT CHEST LUNG CA SCREEN LOW DOSE W/O CM; Future - Varenicline Tartrate, Starter, (CHANTIX STARTING MONTH PAK) 0.5 MG X 11 & 1 MG X 42 TBPK; Take as directed  Dispense: 53 each; Refill: 0  10. Herpes zoster vaccination declined Declines shingrix, educated  on disease process and is aware if he changes his mind to notify office  11. Covid 19 vaccine dose declined Declines covid 19 vaccine. Discussed risk of covid 60 and  if she changes her mind about the vaccine to call the office. Education has been provided regarding the importance of this vaccine but patient still declined. Advised may receive this vaccine at local pharmacy or Health Dept.or vaccine clinic. Aware to provide a copy of the vaccination record if obtained from local pharmacy or Health Dept.  Encouraged to take multivitamin, vitamin d, vitamin c and zinc to increase immune system. Aware can call office if would like to have vaccine here at office. Verbalized acceptance and understanding.  12. Other long term (current) drug therapy - CMP14+EGFR - CBC with Differential/Platelet   Patient was given opportunity to ask questions. Patient verbalized understanding of the plan and was able to repeat key elements of the plan. All questions were answered to their satisfaction.   Arnette Felts, FNP   I, Arnette Felts, FNP, have reviewed all documentation for this visit. The documentation on 12/19/22 for the exam, diagnosis, procedures, and orders are all accurate and complete.   THE PATIENT IS ENCOURAGED TO PRACTICE SOCIAL DISTANCING DUE TO THE COVID-19 PANDEMIC.

## 2022-12-20 LAB — CBC WITH DIFFERENTIAL/PLATELET
Basophils Absolute: 0.1 10*3/uL (ref 0.0–0.2)
Basos: 1 %
EOS (ABSOLUTE): 0.2 10*3/uL (ref 0.0–0.4)
Eos: 3 %
Hematocrit: 36.1 % (ref 34.0–46.6)
Hemoglobin: 11.2 g/dL (ref 11.1–15.9)
Immature Grans (Abs): 0 10*3/uL (ref 0.0–0.1)
Immature Granulocytes: 0 %
Lymphocytes Absolute: 1.8 10*3/uL (ref 0.7–3.1)
Lymphs: 27 %
MCH: 25 pg — ABNORMAL LOW (ref 26.6–33.0)
MCHC: 31 g/dL — ABNORMAL LOW (ref 31.5–35.7)
MCV: 81 fL (ref 79–97)
Monocytes Absolute: 0.4 10*3/uL (ref 0.1–0.9)
Monocytes: 7 %
Neutrophils Absolute: 4.1 10*3/uL (ref 1.4–7.0)
Neutrophils: 62 %
Platelets: 342 10*3/uL (ref 150–450)
RBC: 4.48 x10E6/uL (ref 3.77–5.28)
RDW: 15.7 % — ABNORMAL HIGH (ref 11.7–15.4)
WBC: 6.6 10*3/uL (ref 3.4–10.8)

## 2022-12-20 LAB — LIPID PANEL
Chol/HDL Ratio: 3.2 ratio (ref 0.0–4.4)
Cholesterol, Total: 202 mg/dL — ABNORMAL HIGH (ref 100–199)
HDL: 63 mg/dL (ref >39)
LDL Chol Calc (NIH): 126 mg/dL — ABNORMAL HIGH (ref 0–99)
Triglycerides: 73 mg/dL (ref 0–149)
VLDL Cholesterol Cal: 13 mg/dL (ref 5–40)

## 2022-12-20 LAB — CMP14+EGFR
ALT: 10 IU/L (ref 0–32)
AST: 15 IU/L (ref 0–40)
Albumin/Globulin Ratio: 1.1 — ABNORMAL LOW (ref 1.2–2.2)
Albumin: 4 g/dL (ref 3.8–4.9)
Alkaline Phosphatase: 98 IU/L (ref 44–121)
BUN/Creatinine Ratio: 19 (ref 9–23)
BUN: 10 mg/dL (ref 6–24)
Bilirubin Total: 0.2 mg/dL (ref 0.0–1.2)
CO2: 26 mmol/L (ref 20–29)
Calcium: 9.3 mg/dL (ref 8.7–10.2)
Chloride: 102 mmol/L (ref 96–106)
Creatinine, Ser: 0.53 mg/dL — ABNORMAL LOW (ref 0.57–1.00)
Globulin, Total: 3.6 g/dL (ref 1.5–4.5)
Glucose: 86 mg/dL (ref 70–99)
Potassium: 4.5 mmol/L (ref 3.5–5.2)
Sodium: 140 mmol/L (ref 134–144)
Total Protein: 7.6 g/dL (ref 6.0–8.5)
eGFR: 107 mL/min/{1.73_m2} (ref >59)

## 2022-12-20 LAB — TSH+FREE T4
Free T4: 1.94 ng/dL — ABNORMAL HIGH (ref 0.82–1.77)
TSH: 1.44 u[IU]/mL (ref 0.450–4.500)

## 2022-12-20 LAB — VITAMIN D 25 HYDROXY (VIT D DEFICIENCY, FRACTURES): Vit D, 25-Hydroxy: 24.2 ng/mL — ABNORMAL LOW (ref 30.0–100.0)

## 2022-12-20 LAB — VITAMIN B12: Vitamin B-12: 341 pg/mL (ref 232–1245)

## 2023-01-05 ENCOUNTER — Ambulatory Visit
Admission: RE | Admit: 2023-01-05 | Discharge: 2023-01-05 | Disposition: A | Discharge status: Home / Self Care | Payer: BC Managed Care – PPO | Source: Ambulatory Visit | Attending: Nurse Practitioner | Admitting: Nurse Practitioner

## 2023-01-05 DIAGNOSIS — Z720 Tobacco use: Secondary | ICD-10-CM

## 2023-01-05 DIAGNOSIS — Z122 Encounter for screening for malignant neoplasm of respiratory organs: Secondary | ICD-10-CM | POA: Diagnosis not present

## 2023-01-05 DIAGNOSIS — F17210 Nicotine dependence, cigarettes, uncomplicated: Secondary | ICD-10-CM | POA: Diagnosis not present

## 2023-01-05 DIAGNOSIS — I700 Atherosclerosis of aorta: Secondary | ICD-10-CM | POA: Diagnosis not present

## 2023-01-05 DIAGNOSIS — J439 Emphysema, unspecified: Secondary | ICD-10-CM | POA: Diagnosis not present

## 2023-01-15 ENCOUNTER — Other Ambulatory Visit: Payer: Self-pay | Admitting: Nurse Practitioner

## 2023-01-15 DIAGNOSIS — I700 Atherosclerosis of aorta: Secondary | ICD-10-CM

## 2023-01-15 MED ORDER — ATORVASTATIN CALCIUM 10 MG PO TABS: 10.0000 mg | ORAL_TABLET | Freq: Every day | ORAL | 3 refills | Status: DC

## 2023-05-03 ENCOUNTER — Encounter: Payer: Self-pay | Admitting: Dermatology

## 2023-05-03 ENCOUNTER — Ambulatory Visit: Payer: BC Managed Care – PPO | Admitting: Dermatology

## 2023-05-03 VITALS — BP 151/102 | HR 82

## 2023-05-03 DIAGNOSIS — L821 Other seborrheic keratosis: Secondary | ICD-10-CM | POA: Diagnosis not present

## 2023-05-03 DIAGNOSIS — L814 Other melanin hyperpigmentation: Secondary | ICD-10-CM

## 2023-05-03 DIAGNOSIS — W908XXA Exposure to other nonionizing radiation, initial encounter: Secondary | ICD-10-CM | POA: Diagnosis not present

## 2023-05-03 DIAGNOSIS — L820 Inflamed seborrheic keratosis: Secondary | ICD-10-CM

## 2023-05-03 DIAGNOSIS — L578 Other skin changes due to chronic exposure to nonionizing radiation: Secondary | ICD-10-CM | POA: Diagnosis not present

## 2023-05-03 NOTE — Patient Instructions (Signed)

## 2023-05-03 NOTE — Progress Notes (Signed)
   New Patient Visit   Subjective  Dana Martin is a 57 y.o. female who presents for the following: Irregular skin lesion, has been there for years, but PCP was concerned and recommended her having it checked.   The patient has spots, moles and lesions to be evaluated, some may be new or changing and the patient may have concern these could be cancer.   The following portions of the chart were reviewed this encounter and updated as appropriate: medications, allergies, medical history  Review of Systems:  No other skin or systemic complaints except as noted in HPI or Assessment and Plan.  Objective  Well appearing patient in no apparent distress; mood and affect are within normal limits.   A focused examination was performed of the following areas: the face and arms   Relevant exam findings are noted in the Assessment and Plan.  R tricep x 1 Erythematous stuck-on, waxy papule or plaque    Assessment & Plan   SEBORRHEIC KERATOSIS - Stuck-on, waxy, tan-brown papules and/or plaques  - Benign-appearing - Discussed benign etiology and prognosis. - Observe - Call for any changes  Inflamed seborrheic keratosis R tricep x 1  Symptomatic, irritating, patient would like treated.   Destruction of lesion - R tricep x 1 Complexity: simple   Destruction method: cryotherapy   Informed consent: discussed and consent obtained   Timeout:  patient name, date of birth, surgical site, and procedure verified Lesion destroyed using liquid nitrogen: Yes   Region frozen until ice ball extended beyond lesion: Yes   Outcome: patient tolerated procedure well with no complications   Post-procedure details: wound care instructions given    ACTINIC DAMAGE - chronic, secondary to cumulative UV radiation exposure/sun exposure over time - diffuse scaly erythematous macules with underlying dyspigmentation - Recommend daily broad spectrum sunscreen SPF 30+ to sun-exposed areas, reapply every 2  hours as needed.  - Recommend staying in the shade or wearing long sleeves, sun glasses (UVA+UVB protection) and wide brim hats (4-inch brim around the entire circumference of the hat). - Call for new or changing lesions.  LENTIGINES Exam: scattered tan macules Due to sun exposure Treatment Plan: Benign-appearing, observe. Recommend daily broad spectrum sunscreen SPF 30+ to sun-exposed areas, reapply every 2 hours as needed.  Call for any changes  Return if symptoms worsen or fail to improve.  Maylene Roes, CMA, am acting as scribe for Armida Sans, MD .  Documentation: I have reviewed the above documentation for accuracy and completeness, and I agree with the above.  Armida Sans, MD

## 2023-06-16 ENCOUNTER — Other Ambulatory Visit: Payer: Self-pay | Admitting: Nurse Practitioner

## 2023-06-20 NOTE — Progress Notes (Unsigned)
Madelaine Bhat, CMA,acting as a Neurosurgeon for Arnette Felts, FNP.,have documented all relevant documentation on the behalf of Arnette Felts, FNP,as directed by  Arnette Felts, FNP while in the presence of Arnette Felts, FNP.  Subjective:  Patient ID: Dana Martin , female    DOB: [birth year] , 57 y.o.   MRN: [id number]  No chief complaint on file.   HPI  Patient presents today for a chol and thyroid follow up, Patient reports compliance with medication. Patient denies any chest pain, SOB, or headaches. Patient has no concerns today.     Past Medical History:  Diagnosis Date  . Acute pancreatitis 12/06/2013  . Adverse food reaction 09/04/2019  . Angio-edema   . COPD (chronic obstructive pulmonary disease) (HCC)    denies SOB with daily activities  . Epistaxis, recurrent 10/07/2022  . Full dentures   . GERD (gastroesophageal reflux disease)   . History of pancreatitis   . Hypothyroidism   . Nasal sinus congestion 05/08/2014   with sinus drainage  . Pancreatitis   . PVC (premature ventricular contraction) 07/07/2017  . Stress incontinence   . Symptomatic cholelithiasis 05/2014  . Urticaria   . Varicose veins of bilateral lower extremities with pain   . Wears dentures    upper and lower  . Wears glasses      Family History  Problem Relation Age of Onset  . Hypertension Mother   . Atrial fibrillation Mother   . Heart attack Mother   . Hypertension Sister   . Hypertension Brother   . Hypertension Daughter   . Lung cancer Brother   . Diabetes Sister      Current Outpatient Medications:  .  atorvastatin (LIPITOR) 10 MG tablet, Take 1 tablet (10 mg total) by mouth daily., Disp: 30 tablet, Rfl: 3 .  Cholecalciferol (VITAMIN D3) 25 MCG (1000 UT) CAPS, Take by mouth. daily, Disp: , Rfl:  .  diphenhydrAMINE (BENADRYL) 25 MG tablet, Take 25-50 mg by mouth daily as needed for itching (allergic reaction). , Disp: , Rfl:  .  famotidine (PEPCID) 10 MG tablet, Take 10 mg by mouth  daily., Disp: , Rfl:  .  fluticasone (FLONASE ALLERGY RELIEF) 50 MCG/ACT nasal spray, Place 2 sprays into both nostrils daily., Disp: 16 g, Rfl: 2 .  gabapentin (NEURONTIN) 100 MG capsule, Take 1 capsule (100 mg total) by mouth at bedtime., Disp: 30 capsule, Rfl: 2 .  ibuprofen (ADVIL) 200 MG tablet, Take 400-800 mg by mouth every 6 (six) hours as needed for headache or moderate pain., Disp: , Rfl:  .  ibuprofen (ADVIL) 800 MG tablet, TAKE 1 TABLET BY MOUTH EVERY 6 HOURS AS NEEDED., Disp: 60 tablet, Rfl: 1 .  levocetirizine (XYZAL) 5 MG tablet, Take 5 mg by mouth at bedtime., Disp: , Rfl:  .  Polyethyl Glycol-Propyl Glycol 0.4-0.3 % SOLN, Place 1 drop into both eyes 5 (five) times daily as needed (dry eyes/irriation)., Disp: , Rfl:  .  SYNTHROID 175 MCG tablet, TAKE 1 TABLET BY MOUTH EVERY DAY, Disp: 90 tablet, Rfl: 1 .  Varenicline Tartrate, Starter, (CHANTIX STARTING MONTH PAK) 0.5 MG X 11 & 1 MG X 42 TBPK, Take as directed, Disp: 53 each, Rfl: 0   Allergies  Allergen Reactions  . Fish Allergy Hives  . Gadolinium Derivatives Hives, Itching and Rash    Pt stated she felt itching and burning in her throat immediately following the injection.  . Multihance [Gadobenate] Hives, Itching and Rash    Itching  and burning in her throat. Hives and itching of her skin.  . Omnipaque [Iohexol] Hives  . Other Nausea And Vomiting    ALL NARCOTICS  . Peanut-Containing Drug Products Hives  . Shellfish Allergy Hives  . Codeine Nausea And Vomiting  . Hydrocodone-Acetaminophen Other (See Comments)  . Oxycodone Nausea And Vomiting  . Tramadol     headaches     Review of Systems   There were no vitals filed for this visit. There is no height or weight on file to calculate BMI.  Wt Readings from Last 3 Encounters:  12/19/22 196 lb 3.2 oz (89 kg)  10/07/22 185 lb (83.9 kg)  10/07/22 197 lb (89.4 kg)    The 10-year ASCVD risk score (Arnett DK, et al., 2019) is: 8%   Values used to calculate the  score:     Age: 40 years     Sex: Female     Is Non-Hispanic African American: No     Diabetic: No     Tobacco smoker: Yes     Systolic Blood Pressure: 151 mmHg     Is BP treated: No     HDL Cholesterol: 63 mg/dL     Total Cholesterol: 202 mg/dL  Objective:  Physical Exam      Assessment And Plan:  Mixed hyperlipidemia  Acquired hypothyroidism    No follow-ups on file.  Patient was given opportunity to ask questions. Patient verbalized understanding of the plan and was able to repeat key elements of the plan. All questions were answered to their satisfaction.    Jeanell Sparrow, FNP, have reviewed all documentation for this visit. The documentation on 06/20/23 for the exam, diagnosis, procedures, and orders are all accurate and complete.   IF YOU HAVE BEEN REFERRED TO A SPECIALIST, IT MAY TAKE 1-2 WEEKS TO SCHEDULE/PROCESS THE REFERRAL. IF YOU HAVE NOT HEARD FROM US/SPECIALIST IN TWO WEEKS, PLEASE GIVE [place] A CALL AT [phone].

## 2023-06-21 ENCOUNTER — Ambulatory Visit: Payer: BC Managed Care – PPO | Admitting: Nurse Practitioner

## 2023-06-21 VITALS — BP 120/80 | HR 86 | Temp 98.7°F | Ht 65.0 in | Wt 197.2 lb

## 2023-06-21 DIAGNOSIS — E039 Hypothyroidism, unspecified: Secondary | ICD-10-CM | POA: Diagnosis not present

## 2023-06-21 DIAGNOSIS — I700 Atherosclerosis of aorta: Secondary | ICD-10-CM

## 2023-06-21 DIAGNOSIS — E66811 Obesity, class 1: Secondary | ICD-10-CM

## 2023-06-21 DIAGNOSIS — Z2821 Immunization not carried out because of patient refusal: Secondary | ICD-10-CM

## 2023-06-21 DIAGNOSIS — E6609 Other obesity due to excess calories: Secondary | ICD-10-CM

## 2023-06-21 DIAGNOSIS — E782 Mixed hyperlipidemia: Secondary | ICD-10-CM | POA: Diagnosis not present

## 2023-06-21 DIAGNOSIS — Z6832 Body mass index (BMI) 32.0-32.9, adult: Secondary | ICD-10-CM

## 2023-06-21 NOTE — Assessment & Plan Note (Signed)

## 2023-06-21 NOTE — Assessment & Plan Note (Signed)
She is encouraged to strive for BMI less than 30 to decrease cardiac risk. Advised to aim for at least 150 minutes of exercise per week.

## 2023-06-21 NOTE — Assessment & Plan Note (Signed)
Continue statin, tolerating well 

## 2023-06-21 NOTE — Assessment & Plan Note (Signed)
Diet controlled, continue focusing on diet low in fat. Continue statin, tolerating well.

## 2023-06-21 NOTE — Assessment & Plan Note (Signed)
Declines shingrix, educated on disease process and is aware if he changes his mind to notify office  

## 2023-06-21 NOTE — Assessment & Plan Note (Signed)
Controlled, continue current medications. Will check thyroid levels.

## 2023-06-21 NOTE — Assessment & Plan Note (Signed)

## 2023-06-22 LAB — LIPID PANEL
Chol/HDL Ratio: 3.1 ratio (ref 0.0–4.4)
Cholesterol, Total: 205 mg/dL — ABNORMAL HIGH (ref 100–199)
HDL: 66 mg/dL (ref >39)
LDL Chol Calc (NIH): 127 mg/dL — ABNORMAL HIGH (ref 0–99)
Triglycerides: 67 mg/dL (ref 0–149)
VLDL Cholesterol Cal: 12 mg/dL (ref 5–40)

## 2023-06-22 LAB — CMP14+EGFR
ALT: 8 [IU]/L (ref 0–32)
AST: 13 [IU]/L (ref 0–40)
Albumin: 4.2 g/dL (ref 3.8–4.9)
Alkaline Phosphatase: 104 [IU]/L (ref 44–121)
BUN/Creatinine Ratio: 13 (ref 9–23)
BUN: 8 mg/dL (ref 6–24)
Bilirubin Total: 0.3 mg/dL (ref 0.0–1.2)
CO2: 27 mmol/L (ref 20–29)
Calcium: 9.4 mg/dL (ref 8.7–10.2)
Chloride: 99 mmol/L (ref 96–106)
Creatinine, Ser: 0.6 mg/dL (ref 0.57–1.00)
Globulin, Total: 3.3 g/dL (ref 1.5–4.5)
Glucose: 84 mg/dL (ref 70–99)
Potassium: 4.2 mmol/L (ref 3.5–5.2)
Sodium: 139 mmol/L (ref 134–144)
Total Protein: 7.5 g/dL (ref 6.0–8.5)
eGFR: 103 mL/min/{1.73_m2} (ref >59)

## 2023-06-22 LAB — TSH+FREE T4
Free T4: 1.72 ng/dL (ref 0.82–1.77)
TSH: 1.3 u[IU]/mL (ref 0.450–4.500)

## 2023-07-25 ENCOUNTER — Emergency Department (HOSPITAL_COMMUNITY): Payer: BC Managed Care – PPO

## 2023-07-25 ENCOUNTER — Other Ambulatory Visit: Payer: Self-pay

## 2023-07-25 ENCOUNTER — Inpatient Hospital Stay (HOSPITAL_COMMUNITY)
Admission: EM | Admit: 2023-07-25 | Discharge: 2023-07-29 | DRG: 439 | Disposition: A | Discharge status: Home / Self Care | Payer: BC Managed Care – PPO | Attending: Internal Medicine | Admitting: Internal Medicine

## 2023-07-25 DIAGNOSIS — K5900 Constipation, unspecified: Secondary | ICD-10-CM | POA: Diagnosis not present

## 2023-07-25 DIAGNOSIS — Z888 Allergy status to other drugs, medicaments and biological substances status: Secondary | ICD-10-CM | POA: Diagnosis not present

## 2023-07-25 DIAGNOSIS — K859 Acute pancreatitis without necrosis or infection, unspecified: Secondary | ICD-10-CM | POA: Diagnosis present

## 2023-07-25 DIAGNOSIS — Z833 Family history of diabetes mellitus: Secondary | ICD-10-CM | POA: Diagnosis not present

## 2023-07-25 DIAGNOSIS — Z886 Allergy status to analgesic agent status: Secondary | ICD-10-CM

## 2023-07-25 DIAGNOSIS — Z91013 Allergy to seafood: Secondary | ICD-10-CM

## 2023-07-25 DIAGNOSIS — Z79899 Other long term (current) drug therapy: Secondary | ICD-10-CM

## 2023-07-25 DIAGNOSIS — G4733 Obstructive sleep apnea (adult) (pediatric): Secondary | ICD-10-CM | POA: Diagnosis present

## 2023-07-25 DIAGNOSIS — K8500 Idiopathic acute pancreatitis without necrosis or infection: Principal | ICD-10-CM | POA: Diagnosis present

## 2023-07-25 DIAGNOSIS — E876 Hypokalemia: Secondary | ICD-10-CM | POA: Diagnosis not present

## 2023-07-25 DIAGNOSIS — K2980 Duodenitis without bleeding: Secondary | ICD-10-CM | POA: Diagnosis not present

## 2023-07-25 DIAGNOSIS — K852 Alcohol induced acute pancreatitis without necrosis or infection: Secondary | ICD-10-CM | POA: Diagnosis not present

## 2023-07-25 DIAGNOSIS — J449 Chronic obstructive pulmonary disease, unspecified: Secondary | ICD-10-CM | POA: Diagnosis present

## 2023-07-25 DIAGNOSIS — D649 Anemia, unspecified: Secondary | ICD-10-CM | POA: Diagnosis not present

## 2023-07-25 DIAGNOSIS — Z91041 Radiographic dye allergy status: Secondary | ICD-10-CM | POA: Diagnosis not present

## 2023-07-25 DIAGNOSIS — Z801 Family history of malignant neoplasm of trachea, bronchus and lung: Secondary | ICD-10-CM | POA: Diagnosis not present

## 2023-07-25 DIAGNOSIS — K219 Gastro-esophageal reflux disease without esophagitis: Secondary | ICD-10-CM | POA: Diagnosis not present

## 2023-07-25 DIAGNOSIS — E871 Hypo-osmolality and hyponatremia: Secondary | ICD-10-CM | POA: Diagnosis present

## 2023-07-25 DIAGNOSIS — Z885 Allergy status to narcotic agent status: Secondary | ICD-10-CM | POA: Diagnosis not present

## 2023-07-25 DIAGNOSIS — F17210 Nicotine dependence, cigarettes, uncomplicated: Secondary | ICD-10-CM | POA: Diagnosis present

## 2023-07-25 DIAGNOSIS — Z90710 Acquired absence of both cervix and uterus: Secondary | ICD-10-CM

## 2023-07-25 DIAGNOSIS — R030 Elevated blood-pressure reading, without diagnosis of hypertension: Secondary | ICD-10-CM | POA: Diagnosis present

## 2023-07-25 DIAGNOSIS — E039 Hypothyroidism, unspecified: Secondary | ICD-10-CM | POA: Diagnosis present

## 2023-07-25 DIAGNOSIS — E785 Hyperlipidemia, unspecified: Secondary | ICD-10-CM | POA: Diagnosis not present

## 2023-07-25 DIAGNOSIS — Z8249 Family history of ischemic heart disease and other diseases of the circulatory system: Secondary | ICD-10-CM

## 2023-07-25 DIAGNOSIS — Z79890 Hormone replacement therapy (postmenopausal): Secondary | ICD-10-CM | POA: Diagnosis not present

## 2023-07-25 DIAGNOSIS — R3129 Other microscopic hematuria: Secondary | ICD-10-CM | POA: Diagnosis present

## 2023-07-25 DIAGNOSIS — Z91010 Allergy to peanuts: Secondary | ICD-10-CM | POA: Diagnosis not present

## 2023-07-25 DIAGNOSIS — K429 Umbilical hernia without obstruction or gangrene: Secondary | ICD-10-CM | POA: Diagnosis not present

## 2023-07-25 DIAGNOSIS — Z9049 Acquired absence of other specified parts of digestive tract: Secondary | ICD-10-CM | POA: Diagnosis not present

## 2023-07-25 DIAGNOSIS — R0902 Hypoxemia: Secondary | ICD-10-CM | POA: Diagnosis present

## 2023-07-25 DIAGNOSIS — D72828 Other elevated white blood cell count: Secondary | ICD-10-CM | POA: Diagnosis present

## 2023-07-25 DIAGNOSIS — K5730 Diverticulosis of large intestine without perforation or abscess without bleeding: Secondary | ICD-10-CM | POA: Diagnosis not present

## 2023-07-25 LAB — URINALYSIS, ROUTINE W REFLEX MICROSCOPIC
Bacteria, UA: NONE SEEN
Bilirubin Urine: NEGATIVE
Glucose, UA: NEGATIVE mg/dL
Ketones, ur: NEGATIVE mg/dL
Leukocytes,Ua: NEGATIVE
Nitrite: NEGATIVE
Protein, ur: 100 mg/dL — AB
RBC / HPF: >50 RBC/hpf (ref 0–5)
Specific Gravity, Urine: 1.023 (ref 1.005–1.030)
pH: 6 (ref 5.0–8.0)

## 2023-07-25 LAB — CBC
HCT: 35.4 % — ABNORMAL LOW (ref 36.0–46.0)
Hemoglobin: 11 g/dL — ABNORMAL LOW (ref 12.0–15.0)
MCH: 25.6 pg — ABNORMAL LOW (ref 26.0–34.0)
MCHC: 31.1 g/dL (ref 30.0–36.0)
MCV: 82.5 fL (ref 80.0–100.0)
Platelets: 284 10*3/uL (ref 150–400)
RBC: 4.29 MIL/uL (ref 3.87–5.11)
RDW: 17.1 % — ABNORMAL HIGH (ref 11.5–15.5)
WBC: 12.2 10*3/uL — ABNORMAL HIGH (ref 4.0–10.5)
nRBC: 0 % (ref 0.0–0.2)

## 2023-07-25 LAB — COMPREHENSIVE METABOLIC PANEL
ALT: 11 U/L (ref 0–44)
AST: 16 U/L (ref 15–41)
Albumin: 3.6 g/dL (ref 3.5–5.0)
Alkaline Phosphatase: 76 U/L (ref 38–126)
Anion gap: 7 (ref 5–15)
BUN: 8 mg/dL (ref 6–20)
CO2: 26 mmol/L (ref 22–32)
Calcium: 9 mg/dL (ref 8.9–10.3)
Chloride: 101 mmol/L (ref 98–111)
Creatinine, Ser: 0.6 mg/dL (ref 0.44–1.00)
GFR, Estimated: >60 mL/min (ref >60)
Glucose, Bld: 103 mg/dL — ABNORMAL HIGH (ref 70–99)
Potassium: 3.8 mmol/L (ref 3.5–5.1)
Sodium: 134 mmol/L — ABNORMAL LOW (ref 135–145)
Total Bilirubin: 0.5 mg/dL (ref <1.2)
Total Protein: 7.4 g/dL (ref 6.5–8.1)

## 2023-07-25 LAB — LIPASE, BLOOD: Lipase: 582 U/L — ABNORMAL HIGH (ref 11–51)

## 2023-07-25 MED ORDER — DEXTROSE-SODIUM CHLORIDE 5-0.9 % IV SOLN
INTRAVENOUS | Status: DC
  Administered 2023-07-25: 1000 mL via INTRAVENOUS

## 2023-07-25 MED ORDER — MORPHINE SULFATE (PF) 4 MG/ML IV SOLN
4.0000 mg | Freq: Once | INTRAVENOUS | Status: AC
  Administered 2023-07-25: 4 mg via INTRAVENOUS
  Filled 2023-07-25: qty 1

## 2023-07-25 MED ORDER — ONDANSETRON HCL 4 MG/2ML IJ SOLN
4.0000 mg | Freq: Once | INTRAMUSCULAR | Status: AC
  Administered 2023-07-25: 4 mg via INTRAVENOUS
  Filled 2023-07-25: qty 2

## 2023-07-25 MED ORDER — SODIUM CHLORIDE 0.9 % IV BOLUS
1000.0000 mL | Freq: Once | INTRAVENOUS | Status: AC
  Administered 2023-07-25: 1000 mL via INTRAVENOUS

## 2023-07-25 NOTE — ED Notes (Signed)
Pt placed on 2 liters oxygen after Morphine administration, sats dropped to 87%

## 2023-07-25 NOTE — ED Notes (Signed)
ED TO INPATIENT HANDOFF REPORT  ED Nurse Name and Phone #: Raynelle Fanning, RN  S Name/Age/Gender Dana Martin 57 y.o. female Room/Bed: 004C/004C  Code Status   Code Status: Prior  Home/SNF/Other Home Patient oriented to: self, place, time, and situation Is this baseline? Yes   Triage Complete: Triage complete  Chief Complaint Acute pancreatitis [K85.90]  Triage Note C/o sharp upper abd pain with intermittent nausea since 9pm last night.  States pain alleviated some with Advil.  Also reports bilateral leg cramps last night.  Last BM yesterday- "harder than usual".   Allergies Allergies  Allergen Reactions   Fish Allergy Hives   Gadolinium Derivatives Hives, Itching and Rash    Pt stated she felt itching and burning in her throat immediately following the injection.   Multihance [Gadobenate] Hives, Itching and Rash    Itching and burning in her throat. Hives and itching of her skin.   Omnipaque [Iohexol] Hives   Other Nausea And Vomiting    ALL NARCOTICS   Peanut-Containing Drug Products Hives   Shellfish Allergy Hives   Codeine Nausea And Vomiting   Hydrocodone-Acetaminophen Other (See Comments)   Oxycodone Nausea And Vomiting   Tramadol     headaches    Level of Care/Admitting Diagnosis ED Disposition     ED Disposition  Admit   Condition  --   Comment  Hospital Area: [facility] [[id number]]  Level of Care: Med-Surg [16]  May place patient in observation at [facility] or [facility] if equivalent level of care is available:: Yes  Covid Evaluation: Asymptomatic - no recent exposure (last 10 days) testing not required  Diagnosis: Acute pancreatitis [577.0.ICD-9-CM]  Admitting Physician: John Giovanni [[id number]]  Attending Physician: John Giovanni [[id number]]          B Medical/Surgery History Past Medical History:  Diagnosis Date   Acute pancreatitis 12/06/2013   Adverse food reaction 09/04/2019   Angio-edema    COPD (chronic  obstructive pulmonary disease) (HCC)    denies SOB with daily activities   Epistaxis, recurrent 10/07/2022   Full dentures    GERD (gastroesophageal reflux disease)    History of pancreatitis    Hypothyroidism    Nasal sinus congestion 05/08/2014   with sinus drainage   Pancreatitis    PVC (premature ventricular contraction) 07/07/2017   Stress incontinence    Symptomatic cholelithiasis 05/2014   Urticaria    Varicose veins of bilateral lower extremities with pain    Wears dentures    upper and lower   Wears glasses    Past Surgical History:  Procedure Laterality Date   BREAST BIOPSY Right pt unsure   benign   BREAST EXCISIONAL BIOPSY Left pt unsure   benign   BREAST LUMPECTOMY     CHOLECYSTECTOMY N/A 05/13/2014   Procedure: LAPAROSCOPIC CHOLECYSTECTOMY WITH INTRAOPERATIVE CHOLANGIOGRAM;  Surgeon: Abigail Miyamoto, MD;  Location: [facility];  Service: General;  Laterality: N/A;   ESOPHAGOGASTRODUODENOSCOPY (EGD) WITH PROPOFOL N/A 08/23/2019   Procedure: ESOPHAGOGASTRODUODENOSCOPY (EGD) WITH PROPOFOL;  Surgeon: Jeani Hawking, MD;  Location: [facility];  Service: Endoscopy;  Laterality: N/A;   EUS N/A 02/19/2014   Procedure: ESOPHAGEAL ENDOSCOPIC ULTRASOUND (EUS) RADIAL;  Surgeon: Willis Modena, MD;  Location: [facility];  Service: Endoscopy;  Laterality: N/A;   TUBAL LIGATION     UPPER ESOPHAGEAL ENDOSCOPIC ULTRASOUND (EUS) N/A 08/23/2019   Procedure: UPPER ESOPHAGEAL ENDOSCOPIC ULTRASOUND (EUS);  Surgeon: Jeani Hawking, MD;  Location: [facility];  Service: Endoscopy;  Laterality:  N/A;   VAGINAL HYSTERECTOMY  11/13/2001   transvaginal      A IV Location/Drains/Wounds Patient Lines/Drains/Airways Status     Active Line/Drains/Airways     Name Placement date Placement time Site Days   Peripheral IV 07/25/23 20 G Anterior;Distal;Right Forearm 07/25/23  1547  Forearm  less than 1   Incision - 4 Ports Abdomen 1: Mid;Upper 2: Umbilicus 3: Right;Lower;Lateral 4:  Right;Mid;Upper 05/13/14  0755  -- 3360            Intake/Output Last 24 hours  Intake/Output Summary (Last 24 hours) at 07/25/2023 2305 Last data filed at 07/25/2023 2146 Gross per 24 hour  Intake 1000 ml  Output --  Net 1000 ml    Labs/Imaging Results for orders placed or performed during the hospital encounter of 07/25/23 (from the past 48 hours)  Lipase, blood     Status: Abnormal   Collection Time: 07/25/23 10:16 AM  Result Value Ref Range   Lipase 582 (H) 11 - 51 U/L    Comment: RESULTS CONFIRMED BY MANUAL DILUTION Performed at [facility], [address] [place], [place] [address]   Comprehensive metabolic panel     Status: Abnormal   Collection Time: 07/25/23 10:16 AM  Result Value Ref Range   Sodium 134 (L) 135 - 145 mmol/L   Potassium 3.8 3.5 - 5.1 mmol/L   Chloride 101 98 - 111 mmol/L   CO2 26 22 - 32 mmol/L   Glucose, Bld 103 (H) 70 - 99 mg/dL    Comment: Glucose reference range applies only to samples taken after fasting for at least 8 hours.   BUN 8 6 - 20 mg/dL   Creatinine, Ser 8.46 0.44 - 1.00 mg/dL   Calcium 9.0 8.9 - 96.2 mg/dL   Total Protein 7.4 6.5 - 8.1 g/dL   Albumin 3.6 3.5 - 5.0 g/dL   AST 16 15 - 41 U/L   ALT 11 0 - 44 U/L   Alkaline Phosphatase 76 38 - 126 U/L   Total Bilirubin 0.5 <1.2 mg/dL   GFR, Estimated >95 >28 mL/min    Comment: (NOTE) Calculated using the CKD-EPI Creatinine Equation (2021)    Anion gap 7 5 - 15    Comment: Performed at [facility], [address], [place], [place] [address]  CBC     Status: Abnormal   Collection Time: 07/25/23 10:16 AM  Result Value Ref Range   WBC 12.2 (H) 4.0 - 10.5 K/uL   RBC 4.29 3.87 - 5.11 MIL/uL   Hemoglobin 11.0 (L) 12.0 - 15.0 g/dL   HCT 40.1 (L) 02.7 - 25.3 %   MCV 82.5 80.0 - 100.0 fL   MCH 25.6 (L) 26.0 - 34.0 pg   MCHC 31.1 30.0 - 36.0 g/dL   RDW 66.4 (H) 40.3 - 47.4 %   Platelets 284 150 - 400 K/uL   nRBC 0.0 0.0 - 0.2 %    Comment: Performed at [facility], [address], [place], [place] [address]  Urinalysis, Routine w reflex microscopic -Urine, Clean Catch     Status: Abnormal   Collection Time: 07/25/23 10:41 AM  Result Value Ref Range   Color, Urine YELLOW YELLOW   APPearance HAZY (A) CLEAR   Specific Gravity, Urine 1.023 1.005 - 1.030   pH 6.0 5.0 - 8.0   Glucose, UA NEGATIVE NEGATIVE mg/dL   Hgb urine dipstick LARGE (A) NEGATIVE   Bilirubin Urine NEGATIVE NEGATIVE  Ketones, ur NEGATIVE NEGATIVE mg/dL   Protein, ur 161 (A) NEGATIVE mg/dL   Nitrite NEGATIVE NEGATIVE   Leukocytes,Ua NEGATIVE NEGATIVE   RBC / HPF >50 0 - 5 RBC/hpf   WBC, UA 0-5 0 - 5 WBC/hpf   Bacteria, UA NONE SEEN NONE SEEN   Squamous Epithelial / HPF 0-5 0 - 5 /HPF   Mucus PRESENT     Comment: Performed at [facility], [address], [place], [place] [address]   CT ABDOMEN PELVIS WO CONTRAST Result Date: 07/25/2023 CLINICAL DATA:  Abdominal pain.  Pancreatitis suspected. EXAM: CT ABDOMEN AND PELVIS WITHOUT CONTRAST TECHNIQUE: Multidetector CT imaging of the abdomen and pelvis was performed following the standard protocol without IV contrast. RADIATION DOSE REDUCTION: This exam was performed according to the departmental dose-optimization program which includes automated exposure control, adjustment of the mA and/or kV according to patient size and/or use of iterative reconstruction technique. COMPARISON:  CT 07/30/2019 FINDINGS: Lower chest: Minimal hypoventilatory change in the lung bases. No pleural effusion. Hepatobiliary: Borderline hepatic steatosis. Subcentimeter hypodensity adjacent to the falciform ligament is unchanged from prior and likely small cyst. Stable cyst in the medial right lobe of the liver. Clips in the gallbladder fossa postcholecystectomy. No biliary dilatation. Pancreas: Moderate diffuse peripancreatic fat stranding and edema. No peripancreatic collection. No pancreatic air. Spleen: Unremarkable unenhanced appearance.  Adrenals/Urinary Tract: No adrenal nodule. No hydronephrosis. Bilateral renal cysts. No further follow-up imaging is recommended. Punctate calcification in the upper right kidney may represent a parenchymal calcification or nonobstructing stone. Decompressed ureters. Unremarkable urinary bladder. Stomach/Bowel: Nondistended stomach. Mild duodenal wall thickening adjacent to pancreatic inflammation. Remainder of the small bowel is unremarkable and decompressed. No obstruction. There is also mild wall thickening of the transverse colon adjacent to pancreatic inflammation. Normal appendix. Colonic diverticulosis extending from the splenic flexure distally without diverticulitis. Vascular/Lymphatic: Aortic atherosclerosis without aneurysm. Cannot assess for splenic vein patency in the absence of IV contrast. No bulky abdominopelvic adenopathy. Reproductive: Status post hysterectomy. No adnexal masses. Other: Peripancreatic fat stranding and edema with ill-defined fluid tracking into the anterior left pararenal space and pericolic gutter. No organized collection. No free air. Tiny fat containing umbilical hernia. Musculoskeletal: Moderate multilevel degenerative change in the lumbar spine. Modic endplate changes at L4-L5. There are no acute or suspicious osseous abnormalities. IMPRESSION: 1. Acute pancreatitis. No peripancreatic collection. 2. Mild wall thickening of the duodenum and transverse colon adjacent to pancreatic inflammation, likely reactive. 3. Colonic diverticulosis without diverticulitis. 4. Borderline hepatic steatosis. Aortic Atherosclerosis (ICD10-I70.0). Electronically Signed   By: Narda Rutherford M.D.   On: 07/25/2023 18:15    Pending Labs Unresulted Labs (From admission, onward)    None       Vitals/Pain Today's Vitals   07/25/23 2130 07/25/23 2200 07/25/23 2230 07/25/23 2300  BP: 138/89 (!) 145/90 (!) 123/90 (!) 139/92  Pulse: 89 90 87 86  Resp:  16    Temp:  98.2 F (36.8 C)     TempSrc:      SpO2: 100% 100% 100% 100%  PainSc:        Isolation Precautions No active isolations  Medications Medications  dextrose 5 %-0.9 % sodium chloride infusion (1,000 mLs Intravenous New Bag/Given 07/25/23 2046)  morphine (PF) 4 MG/ML injection 4 mg (4 mg Intravenous Given 07/25/23 1548)  ondansetron (ZOFRAN) injection 4 mg (4 mg Intravenous Given 07/25/23 1547)  morphine (PF) 4 MG/ML injection 4 mg (4 mg Intravenous Given 07/25/23 2036)  ondansetron (ZOFRAN) injection 4  mg (4 mg Intravenous Given 07/25/23 2035)  sodium chloride 0.9 % bolus 1,000 mL (0 mLs Intravenous Stopped 07/25/23 2146)    Mobility walks     Focused Assessments Cardiac Assessment Handoff:    No results found for: "CKTOTAL", "CKMB", "CKMBINDEX", "TROPONINI" No results found for: "DDIMER" Does the Patient currently have chest pain? No    R Recommendations: See Admitting Provider Note  Report given to:   Additional Notes: Pt is being admitted for Acute Pancreatitis. Pt is A&O x4.

## 2023-07-25 NOTE — ED Provider Notes (Signed)
EMERGENCY DEPARTMENT AT [facility] Provider Note   CSN: [id number] Arrival date & time: 07/25/23  3016     History  Chief Complaint  Patient presents with   Abdominal Pain    Dana Martin is a 57 y.o. female.  This is a 57 year old female is here today for epigastric pain.  She has a prior history of pancreatitis, states that she was never given a reason for why, but tells me that she stopped drinking 3 years ago.  She says that she did have an alcoholic beverage for Thanksgiving, and a week ago had 2 margaritas.  She is not having alcohol before since.  Pain began suddenly last evening.   Abdominal Pain      Home Medications Prior to Admission medications   Medication Sig Start Date End Date Taking? Authorizing Provider  atorvastatin (LIPITOR) 10 MG tablet Take 1 tablet (10 mg total) by mouth daily. 01/15/23 01/15/24  Arnette Felts, FNP  Cholecalciferol (VITAMIN D3) 25 MCG (1000 UT) CAPS Take by mouth. daily    [provider]  diphenhydrAMINE (BENADRYL) 25 MG tablet Take 25-50 mg by mouth daily as needed for itching (allergic reaction).     [provider]  famotidine (PEPCID) 10 MG tablet Take 10 mg by mouth daily.    [provider]  fluticasone (FLONASE ALLERGY RELIEF) 50 MCG/ACT nasal spray Place 2 sprays into both nostrils daily. 05/03/22   Arnette Felts, FNP  gabapentin (NEURONTIN) 100 MG capsule Take 1 capsule (100 mg total) by mouth at bedtime. 12/19/22 03/19/23  Arnette Felts, FNP  ibuprofen (ADVIL) 200 MG tablet Take 400-800 mg by mouth every 6 (six) hours as needed for headache or moderate pain.    [provider]  ibuprofen (ADVIL) 800 MG tablet TAKE 1 TABLET BY MOUTH EVERY 6 HOURS AS NEEDED. 09/07/21   Candelaria Stagers, DPM  levocetirizine (XYZAL) 5 MG tablet Take 5 mg by mouth at bedtime.    [provider]  Polyethyl Glycol-Propyl Glycol 0.4-0.3 % SOLN Place 1 drop into both eyes 5 (five) times daily as  needed (dry eyes/irriation).    [provider]  SYNTHROID 175 MCG tablet TAKE 1 TABLET BY MOUTH EVERY DAY 06/21/23   Arnette Felts, FNP      Allergies    Fish allergy, Gadolinium derivatives, Multihance [gadobenate], Omnipaque [iohexol], Other, Peanut-containing drug products, Shellfish allergy, Codeine, Hydrocodone-acetaminophen, Oxycodone, and Tramadol    Review of Systems   Review of Systems  Gastrointestinal:  Positive for abdominal pain.    Physical Exam Updated Vital Signs BP (!) 140/89   Pulse 92   Temp 98 F (36.7 C)   Resp 20   SpO2 98%  Physical Exam Vitals reviewed.  Abdominal:     Palpations: Abdomen is soft.     Tenderness: There is abdominal tenderness in the epigastric area.  Neurological:     Mental Status: She is alert.     ED Results / Procedures / Treatments   Labs (all labs ordered are listed, but only abnormal results are displayed) Labs Reviewed  LIPASE, BLOOD - Abnormal; Notable for the following components:      Result Value   Lipase 582 (*)    All other components within normal limits  COMPREHENSIVE METABOLIC PANEL - Abnormal; Notable for the following components:   Sodium 134 (*)    Glucose, Bld 103 (*)    All other components within normal limits  CBC - Abnormal; Notable for  the following components:   WBC 12.2 (*)    Hemoglobin 11.0 (*)    HCT 35.4 (*)    MCH 25.6 (*)    RDW 17.1 (*)    All other components within normal limits  URINALYSIS, ROUTINE W REFLEX MICROSCOPIC - Abnormal; Notable for the following components:   APPearance HAZY (*)    Hgb urine dipstick LARGE (*)    Protein, ur 100 (*)    All other components within normal limits    EKG None  Radiology No results found.  Procedures Procedures    Medications Ordered in ED Medications  morphine (PF) 4 MG/ML injection 4 mg (has no administration in time range)  ondansetron (ZOFRAN) injection 4 mg (has no administration in time range)    ED Course/  Medical Decision Making/ A&P                                 Medical Decision Making 57 year old female here today with epigastric pain.  Differential diagnose include pancreatitis, less likely ACS, less likely obstruction.  Plan-looking at the patient's labs, LFTs and bilirubin not elevated.  Lipase is elevated at greater than 500.  Will provide analgesia for the patient.  CT imaging ordered.  Patient reports an anaphylactic reaction to contrast dye.  Will obtain a noncontrasted image.  Lower suspicion for biliary pathology given the patient's labs.  Patient will be signed out to Dr. Deretha Emory ending CT imaging and admission.  Amount and/or Complexity of Data Reviewed Labs: ordered. Radiology: ordered.  Risk Prescription drug management.           Final Clinical Impression(s) / ED Diagnoses Final diagnoses:  Alcohol-induced acute pancreatitis, unspecified complication status    Rx / DC Orders ED Discharge Orders     None         Anders Simmonds T, DO 07/25/23 1519

## 2023-07-25 NOTE — ED Provider Notes (Signed)
Patient still with epigastric abdominal pain.  Patient did have some dry heaves earlier.  Zofran is helping with the vomiting.  Pain medicine helping with that.  CT scan consistent with uncomplicated pancreatitis.  Will contact hospitalist for admission.  She is followed by [facility].   Vanetta Mulders, MD 07/25/23 2042

## 2023-07-25 NOTE — ED Triage Notes (Signed)
C/o sharp upper abd pain with intermittent nausea since 9pm last night.  States pain alleviated some with Advil.  Also reports bilateral leg cramps last night.  Last BM yesterday- "harder than usual".

## 2023-07-26 ENCOUNTER — Encounter (HOSPITAL_COMMUNITY): Payer: Self-pay | Admitting: Internal Medicine

## 2023-07-26 DIAGNOSIS — Z9049 Acquired absence of other specified parts of digestive tract: Secondary | ICD-10-CM | POA: Diagnosis not present

## 2023-07-26 DIAGNOSIS — R0902 Hypoxemia: Secondary | ICD-10-CM

## 2023-07-26 DIAGNOSIS — Z885 Allergy status to narcotic agent status: Secondary | ICD-10-CM | POA: Diagnosis not present

## 2023-07-26 DIAGNOSIS — D649 Anemia, unspecified: Secondary | ICD-10-CM | POA: Diagnosis present

## 2023-07-26 DIAGNOSIS — Z91041 Radiographic dye allergy status: Secondary | ICD-10-CM | POA: Diagnosis not present

## 2023-07-26 DIAGNOSIS — K859 Acute pancreatitis without necrosis or infection, unspecified: Secondary | ICD-10-CM | POA: Diagnosis not present

## 2023-07-26 DIAGNOSIS — K8500 Idiopathic acute pancreatitis without necrosis or infection: Principal | ICD-10-CM

## 2023-07-26 DIAGNOSIS — Z8249 Family history of ischemic heart disease and other diseases of the circulatory system: Secondary | ICD-10-CM | POA: Diagnosis not present

## 2023-07-26 DIAGNOSIS — E039 Hypothyroidism, unspecified: Secondary | ICD-10-CM | POA: Diagnosis present

## 2023-07-26 DIAGNOSIS — Z90710 Acquired absence of both cervix and uterus: Secondary | ICD-10-CM | POA: Diagnosis not present

## 2023-07-26 DIAGNOSIS — E876 Hypokalemia: Secondary | ICD-10-CM | POA: Diagnosis present

## 2023-07-26 DIAGNOSIS — K219 Gastro-esophageal reflux disease without esophagitis: Secondary | ICD-10-CM

## 2023-07-26 DIAGNOSIS — K5900 Constipation, unspecified: Secondary | ICD-10-CM | POA: Diagnosis present

## 2023-07-26 DIAGNOSIS — Z91010 Allergy to peanuts: Secondary | ICD-10-CM | POA: Diagnosis not present

## 2023-07-26 DIAGNOSIS — Z888 Allergy status to other drugs, medicaments and biological substances status: Secondary | ICD-10-CM | POA: Diagnosis not present

## 2023-07-26 DIAGNOSIS — E871 Hypo-osmolality and hyponatremia: Secondary | ICD-10-CM | POA: Diagnosis present

## 2023-07-26 DIAGNOSIS — Z79890 Hormone replacement therapy (postmenopausal): Secondary | ICD-10-CM | POA: Diagnosis not present

## 2023-07-26 DIAGNOSIS — Z79899 Other long term (current) drug therapy: Secondary | ICD-10-CM | POA: Diagnosis not present

## 2023-07-26 DIAGNOSIS — F17210 Nicotine dependence, cigarettes, uncomplicated: Secondary | ICD-10-CM | POA: Diagnosis present

## 2023-07-26 DIAGNOSIS — Z833 Family history of diabetes mellitus: Secondary | ICD-10-CM | POA: Diagnosis not present

## 2023-07-26 DIAGNOSIS — J449 Chronic obstructive pulmonary disease, unspecified: Secondary | ICD-10-CM | POA: Diagnosis present

## 2023-07-26 DIAGNOSIS — E785 Hyperlipidemia, unspecified: Secondary | ICD-10-CM | POA: Diagnosis present

## 2023-07-26 DIAGNOSIS — Z801 Family history of malignant neoplasm of trachea, bronchus and lung: Secondary | ICD-10-CM | POA: Diagnosis not present

## 2023-07-26 DIAGNOSIS — Z91013 Allergy to seafood: Secondary | ICD-10-CM | POA: Diagnosis not present

## 2023-07-26 DIAGNOSIS — Z886 Allergy status to analgesic agent status: Secondary | ICD-10-CM | POA: Diagnosis not present

## 2023-07-26 DIAGNOSIS — K852 Alcohol induced acute pancreatitis without necrosis or infection: Secondary | ICD-10-CM | POA: Diagnosis present

## 2023-07-26 HISTORY — DX: Hypoxemia: R09.02

## 2023-07-26 LAB — CBC
HCT: 34.3 % — ABNORMAL LOW (ref 36.0–46.0)
Hemoglobin: 10.8 g/dL — ABNORMAL LOW (ref 12.0–15.0)
MCH: 26.2 pg (ref 26.0–34.0)
MCHC: 31.5 g/dL (ref 30.0–36.0)
MCV: 83.1 fL (ref 80.0–100.0)
Platelets: 280 10*3/uL (ref 150–400)
RBC: 4.13 MIL/uL (ref 3.87–5.11)
RDW: 17.1 % — ABNORMAL HIGH (ref 11.5–15.5)
WBC: 18.9 10*3/uL — ABNORMAL HIGH (ref 4.0–10.5)
nRBC: 0 % (ref 0.0–0.2)

## 2023-07-26 LAB — COMPREHENSIVE METABOLIC PANEL
ALT: 12 U/L (ref 0–44)
AST: 15 U/L (ref 15–41)
Albumin: 3.2 g/dL — ABNORMAL LOW (ref 3.5–5.0)
Alkaline Phosphatase: 70 U/L (ref 38–126)
Anion gap: 6 (ref 5–15)
BUN: 7 mg/dL (ref 6–20)
CO2: 26 mmol/L (ref 22–32)
Calcium: 8.3 mg/dL — ABNORMAL LOW (ref 8.9–10.3)
Chloride: 103 mmol/L (ref 98–111)
Creatinine, Ser: 0.47 mg/dL (ref 0.44–1.00)
GFR, Estimated: >60 mL/min (ref >60)
Glucose, Bld: 139 mg/dL — ABNORMAL HIGH (ref 70–99)
Potassium: 3.4 mmol/L — ABNORMAL LOW (ref 3.5–5.1)
Sodium: 135 mmol/L (ref 135–145)
Total Bilirubin: 0.3 mg/dL (ref <1.2)
Total Protein: 7 g/dL (ref 6.5–8.1)

## 2023-07-26 LAB — HIV ANTIBODY (ROUTINE TESTING W REFLEX): HIV Screen 4th Generation wRfx: NONREACTIVE

## 2023-07-26 LAB — LIPASE, BLOOD: Lipase: 568 U/L — ABNORMAL HIGH (ref 11–51)

## 2023-07-26 MED ORDER — LEVOTHYROXINE SODIUM 75 MCG PO TABS
175.0000 ug | ORAL_TABLET | Freq: Every day | ORAL | Status: DC
  Administered 2023-07-26 – 2023-07-29 (×4): 175 ug via ORAL
  Filled 2023-07-26 (×4): qty 2

## 2023-07-26 MED ORDER — PANTOPRAZOLE SODIUM 40 MG IV SOLR
40.0000 mg | INTRAVENOUS | Status: DC
  Administered 2023-07-26 – 2023-07-28 (×3): 40 mg via INTRAVENOUS
  Filled 2023-07-26 (×3): qty 10

## 2023-07-26 MED ORDER — FENTANYL CITRATE PF 50 MCG/ML IJ SOSY: 12.5000 ug | PREFILLED_SYRINGE | INTRAMUSCULAR | Status: DC | PRN

## 2023-07-26 MED ORDER — ENOXAPARIN SODIUM 40 MG/0.4ML IJ SOSY: 40.0000 mg | PREFILLED_SYRINGE | Freq: Every day | INTRAMUSCULAR | Status: DC

## 2023-07-26 MED ORDER — ACETAMINOPHEN 325 MG PO TABS
650.0000 mg | ORAL_TABLET | Freq: Four times a day (QID) | ORAL | Status: DC | PRN
  Administered 2023-07-26 (×2): 650 mg via ORAL
  Filled 2023-07-26 (×2): qty 2

## 2023-07-26 MED ORDER — HYDRALAZINE HCL 20 MG/ML IJ SOLN: 5.0000 mg | Freq: Four times a day (QID) | INTRAMUSCULAR | Status: DC | PRN

## 2023-07-26 MED ORDER — NALOXONE HCL 0.4 MG/ML IJ SOLN: 0.4000 mg | INTRAMUSCULAR | Status: DC | PRN

## 2023-07-26 MED ORDER — PROCHLORPERAZINE EDISYLATE 10 MG/2ML IJ SOLN
10.0000 mg | Freq: Four times a day (QID) | INTRAMUSCULAR | Status: DC | PRN
  Administered 2023-07-27: 10 mg via INTRAVENOUS
  Filled 2023-07-26: qty 2

## 2023-07-26 MED ORDER — ONDANSETRON HCL 4 MG/2ML IJ SOLN
4.0000 mg | Freq: Four times a day (QID) | INTRAMUSCULAR | Status: DC | PRN
  Administered 2023-07-26 (×3): 4 mg via INTRAVENOUS
  Filled 2023-07-26 (×5): qty 2

## 2023-07-26 MED ORDER — MORPHINE SULFATE (PF) 2 MG/ML IV SOLN
1.0000 mg | INTRAVENOUS | Status: DC | PRN
  Administered 2023-07-26 (×2): 1 mg via INTRAVENOUS
  Filled 2023-07-26 (×2): qty 1

## 2023-07-26 MED ORDER — HYDROMORPHONE HCL 1 MG/ML IJ SOLN
0.5000 mg | INTRAMUSCULAR | Status: DC | PRN
  Administered 2023-07-26 – 2023-07-27 (×4): 1 mg via INTRAVENOUS
  Filled 2023-07-26 (×4): qty 1

## 2023-07-26 MED ORDER — IBUPROFEN 200 MG PO TABS
200.0000 mg | ORAL_TABLET | Freq: Four times a day (QID) | ORAL | Status: DC | PRN
  Filled 2023-07-26: qty 1

## 2023-07-26 MED ORDER — DEXTROSE-SODIUM CHLORIDE 5-0.9 % IV SOLN
INTRAVENOUS | Status: AC
Start: 2023-07-26 — End: 2023-07-26

## 2023-07-26 MED ORDER — IPRATROPIUM-ALBUTEROL 0.5-2.5 (3) MG/3ML IN SOLN: 3.0000 mL | Freq: Four times a day (QID) | RESPIRATORY_TRACT | Status: DC | PRN

## 2023-07-26 MED ORDER — DEXTROSE-SODIUM CHLORIDE 5-0.9 % IV SOLN: INTRAVENOUS | Status: DC

## 2023-07-26 MED ORDER — HYDRALAZINE HCL 20 MG/ML IJ SOLN
10.0000 mg | Freq: Four times a day (QID) | INTRAMUSCULAR | Status: DC | PRN
  Filled 2023-07-26: qty 1

## 2023-07-26 NOTE — H&P (Signed)
History and Physical    Dana Martin [id number] DOB: Feb 08, 1966 DOA: 07/25/2023  PCP: Arnette Felts, FNP  Patient coming from: Home  Chief Complaint: Abdominal pain  HPI: Dana Martin is a 57 y.o. female with medical history significant of recurrent pancreatitis, history of cholecystectomy, COPD, GERD, hypothyroidism, hyperlipidemia presented to the ED with complaints of epigastric abdominal pain, nausea, and vomiting.  Afebrile, not tachycardic or hypotensive.  Labs notable for WBC count 12.2, hemoglobin 11.0 (stable), MCV 82.5, sodium 134, glucose 103, creatinine 0.6, normal LFTs, lipase 582, UA with evidence of microscopic hematuria but not suggestive of infection.  CT abdomen pelvis showing acute pancreatitis with no peripancreatic collection.  Showing mild wall thickening of the duodenum and transverse colon adjacent to pancreatic inflammation, likely reactive. Patient received IV morphine, Zofran, and 1 L normal saline bolus.  She was not hypoxic on arrival to the ED (satting 98-100%) but later desatted to 87% on room air after she was given IV morphine for pain and was subsequently placed on 2 L [place].  [facility] called to admit.  Patient states she has had pancreatitis 5 or 6 times in the past and has been seen by Dr. Elnoria Howard with GI and had extensive workup done in the past including endoscopic ultrasound but no etiology was discovered.  For the past 24 hours she is having severe epigastric abdominal pain, nausea, and vomiting.  She has also been constipated lately.  Denies fevers or chills.  States she had not consumed any alcohol for several years and then had 1 drink for Thanksgiving and another drink the following week for her son's birthday but otherwise does not drink regularly.  Review of Systems:  Review of Systems  All other systems reviewed and are negative.   Past Medical History:  Diagnosis Date   Acute pancreatitis 12/06/2013   Adverse food reaction 09/04/2019    Angio-edema    COPD (chronic obstructive pulmonary disease) (HCC)    denies SOB with daily activities   Epistaxis, recurrent 10/07/2022   Full dentures    GERD (gastroesophageal reflux disease)    History of pancreatitis    Hypothyroidism    Nasal sinus congestion 05/08/2014   with sinus drainage   Pancreatitis    PVC (premature ventricular contraction) 07/07/2017   Stress incontinence    Symptomatic cholelithiasis 05/2014   Urticaria    Varicose veins of bilateral lower extremities with pain    Wears dentures    upper and lower   Wears glasses     Past Surgical History:  Procedure Laterality Date   BREAST BIOPSY Right pt unsure   benign   BREAST EXCISIONAL BIOPSY Left pt unsure   benign   BREAST LUMPECTOMY     CHOLECYSTECTOMY N/A 05/13/2014   Procedure: LAPAROSCOPIC CHOLECYSTECTOMY WITH INTRAOPERATIVE CHOLANGIOGRAM;  Surgeon: Abigail Miyamoto, MD;  Location: [facility];  Service: General;  Laterality: N/A;   ESOPHAGOGASTRODUODENOSCOPY (EGD) WITH PROPOFOL N/A 08/23/2019   Procedure: ESOPHAGOGASTRODUODENOSCOPY (EGD) WITH PROPOFOL;  Surgeon: Jeani Hawking, MD;  Location: [facility];  Service: Endoscopy;  Laterality: N/A;   EUS N/A 02/19/2014   Procedure: ESOPHAGEAL ENDOSCOPIC ULTRASOUND (EUS) RADIAL;  Surgeon: Willis Modena, MD;  Location: [facility];  Service: Endoscopy;  Laterality: N/A;   TUBAL LIGATION     UPPER ESOPHAGEAL ENDOSCOPIC ULTRASOUND (EUS) N/A 08/23/2019   Procedure: UPPER ESOPHAGEAL ENDOSCOPIC ULTRASOUND (EUS);  Surgeon: Jeani Hawking, MD;  Location: [facility];  Service: Endoscopy;  Laterality: N/A;   VAGINAL HYSTERECTOMY  11/13/2001  transvaginal      reports that she has been smoking cigarettes. She has a 25 pack-year smoking history. She has never used smokeless tobacco. She reports that she does not currently use alcohol. She reports that she does not use drugs.  Allergies  Allergen Reactions   Fish Allergy Hives   Gadolinium  Derivatives Hives, Itching and Rash    Pt stated she felt itching and burning in her throat immediately following the injection.   Multihance [Gadobenate] Hives, Itching and Rash    Itching and burning in her throat. Hives and itching of her skin.   Omnipaque [Iohexol] Hives   Other Nausea And Vomiting    ALL NARCOTICS   Peanut-Containing Drug Products Hives   Shellfish Allergy Hives   Codeine Nausea And Vomiting   Hydrocodone-Acetaminophen Other (See Comments)   Oxycodone Nausea And Vomiting   Tramadol     headaches    Family History  Problem Relation Age of Onset   Hypertension Mother    Atrial fibrillation Mother    Heart attack Mother    Hypertension Sister    Hypertension Brother    Hypertension Daughter    Lung cancer Brother    Diabetes Sister     Prior to Admission medications   Medication Sig Start Date End Date Taking? Authorizing Provider  atorvastatin (LIPITOR) 10 MG tablet Take 1 tablet (10 mg total) by mouth daily. 01/15/23 01/15/24 Yes Arnette Felts, FNP  Cholecalciferol (VITAMIN D3) 25 MCG (1000 UT) CAPS Take 2 capsules by mouth daily. daily   Yes [provider]  diphenhydrAMINE (BENADRYL) 25 MG tablet Take 25-50 mg by mouth daily as needed for itching (allergic reaction).    Yes [provider]  famotidine (PEPCID) 10 MG tablet Take 10 mg by mouth daily.   Yes [provider]  gabapentin (NEURONTIN) 100 MG capsule Take 1 capsule (100 mg total) by mouth at bedtime. 12/19/22 07/25/23 Yes Arnette Felts, FNP  ibuprofen (ADVIL) 200 MG tablet Take 400-800 mg by mouth every 6 (six) hours as needed for headache or moderate pain.   Yes [provider]  levocetirizine (XYZAL) 5 MG tablet Take 5 mg by mouth at bedtime.   Yes [provider]  Polyethyl Glycol-Propyl Glycol 0.4-0.3 % SOLN Place 1 drop into both eyes 5 (five) times daily as needed (dry eyes/irriation).   Yes [provider]  SYNTHROID 175 MCG tablet TAKE 1  TABLET BY MOUTH EVERY DAY 06/21/23  Yes Arnette Felts, FNP    Physical Exam: Vitals:   07/25/23 2230 07/25/23 2300 07/25/23 2330 07/26/23 0000  BP: (!) 123/90 (!) 139/92 (!) 144/93 (!) 138/95  Pulse: 87 86 87 85  Resp:      Temp:      TempSrc:      SpO2: 100% 100% 97% 94%    Physical Exam Vitals reviewed.  Constitutional:      General: She is not in acute distress. HENT:     Head: Normocephalic and atraumatic.  Eyes:     Extraocular Movements: Extraocular movements intact.  Cardiovascular:     Rate and Rhythm: Normal rate and regular rhythm.     Pulses: Normal pulses.  Pulmonary:     Effort: Pulmonary effort is normal. No respiratory distress.     Breath sounds: Normal breath sounds. No wheezing or rales.  Abdominal:     General: Bowel sounds are normal. There is no distension.     Palpations: Abdomen is soft.  Tenderness: There is abdominal tenderness. There is no guarding or rebound.     Comments: Epigastrium tender to palpation  Musculoskeletal:     Cervical back: Normal range of motion.     Right lower leg: No edema.     Left lower leg: No edema.  Skin:    General: Skin is warm and dry.  Neurological:     General: No focal deficit present.     Mental Status: She is alert and oriented to person, place, and time.     Labs on Admission: I have personally reviewed following labs and imaging studies  CBC: Recent Labs  Lab 07/25/23 1016  WBC 12.2*  HGB 11.0*  HCT 35.4*  MCV 82.5  PLT 284   Basic Metabolic Panel: Recent Labs  Lab 07/25/23 1016  NA 134*  K 3.8  CL 101  CO2 26  GLUCOSE 103*  BUN 8  CREATININE 0.60  CALCIUM 9.0   GFR: CrCl cannot be calculated (Unknown ideal weight.). Liver Function Tests: Recent Labs  Lab 07/25/23 1016  AST 16  ALT 11  ALKPHOS 76  BILITOT 0.5  PROT 7.4  ALBUMIN 3.6   Recent Labs  Lab 07/25/23 1016  LIPASE 582*   No results for input(s): "AMMONIA" in the last 168 hours. Coagulation Profile: No  results for input(s): "INR", "PROTIME" in the last 168 hours. Cardiac Enzymes: No results for input(s): "CKTOTAL", "CKMB", "CKMBINDEX", "TROPONINI" in the last 168 hours. BNP (last 3 results) No results for input(s): "PROBNP" in the last 8760 hours. HbA1C: No results for input(s): "HGBA1C" in the last 72 hours. CBG: No results for input(s): "GLUCAP" in the last 168 hours. Lipid Profile: No results for input(s): "CHOL", "HDL", "LDLCALC", "TRIG", "CHOLHDL", "LDLDIRECT" in the last 72 hours. Thyroid Function Tests: No results for input(s): "TSH", "T4TOTAL", "FREET4", "T3FREE", "THYROIDAB" in the last 72 hours. Anemia Panel: No results for input(s): "VITAMINB12", "FOLATE", "FERRITIN", "TIBC", "IRON", "RETICCTPCT" in the last 72 hours. Urine analysis:    Component Value Date/Time   COLORURINE YELLOW 07/25/2023 1041   APPEARANCEUR HAZY (A) 07/25/2023 1041   LABSPEC 1.023 07/25/2023 1041   PHURINE 6.0 07/25/2023 1041   GLUCOSEU NEGATIVE 07/25/2023 1041   HGBUR LARGE (A) 07/25/2023 1041   BILIRUBINUR NEGATIVE 07/25/2023 1041   BILIRUBINUR small 11/28/2018 1610   KETONESUR NEGATIVE 07/25/2023 1041   PROTEINUR 100 (A) 07/25/2023 1041   UROBILINOGEN 0.2 11/28/2018 1610   UROBILINOGEN 0.2 03/16/2015 1648   NITRITE NEGATIVE 07/25/2023 1041   LEUKOCYTESUR NEGATIVE 07/25/2023 1041    Radiological Exams on Admission: CT ABDOMEN PELVIS WO CONTRAST Result Date: 07/25/2023 CLINICAL DATA:  Abdominal pain.  Pancreatitis suspected. EXAM: CT ABDOMEN AND PELVIS WITHOUT CONTRAST TECHNIQUE: Multidetector CT imaging of the abdomen and pelvis was performed following the standard protocol without IV contrast. RADIATION DOSE REDUCTION: This exam was performed according to the departmental dose-optimization program which includes automated exposure control, adjustment of the mA and/or kV according to patient size and/or use of iterative reconstruction technique. COMPARISON:  CT 07/30/2019 FINDINGS: Lower  chest: Minimal hypoventilatory change in the lung bases. No pleural effusion. Hepatobiliary: Borderline hepatic steatosis. Subcentimeter hypodensity adjacent to the falciform ligament is unchanged from prior and likely small cyst. Stable cyst in the medial right lobe of the liver. Clips in the gallbladder fossa postcholecystectomy. No biliary dilatation. Pancreas: Moderate diffuse peripancreatic fat stranding and edema. No peripancreatic collection. No pancreatic air. Spleen: Unremarkable unenhanced appearance. Adrenals/Urinary Tract: No adrenal nodule. No hydronephrosis. Bilateral renal cysts. No  further follow-up imaging is recommended. Punctate calcification in the upper right kidney may represent a parenchymal calcification or nonobstructing stone. Decompressed ureters. Unremarkable urinary bladder. Stomach/Bowel: Nondistended stomach. Mild duodenal wall thickening adjacent to pancreatic inflammation. Remainder of the small bowel is unremarkable and decompressed. No obstruction. There is also mild wall thickening of the transverse colon adjacent to pancreatic inflammation. Normal appendix. Colonic diverticulosis extending from the splenic flexure distally without diverticulitis. Vascular/Lymphatic: Aortic atherosclerosis without aneurysm. Cannot assess for splenic vein patency in the absence of IV contrast. No bulky abdominopelvic adenopathy. Reproductive: Status post hysterectomy. No adnexal masses. Other: Peripancreatic fat stranding and edema with ill-defined fluid tracking into the anterior left pararenal space and pericolic gutter. No organized collection. No free air. Tiny fat containing umbilical hernia. Musculoskeletal: Moderate multilevel degenerative change in the lumbar spine. Modic endplate changes at L4-L5. There are no acute or suspicious osseous abnormalities. IMPRESSION: 1. Acute pancreatitis. No peripancreatic collection. 2. Mild wall thickening of the duodenum and transverse colon adjacent to  pancreatic inflammation, likely reactive. 3. Colonic diverticulosis without diverticulitis. 4. Borderline hepatic steatosis. Aortic Atherosclerosis (ICD10-I70.0). Electronically Signed   By: Narda Rutherford M.D.   On: 07/25/2023 18:15    Assessment and Plan  Recurrent pancreatitis Etiology unknown.  History of prior cholecystectomy.  Patient denies regular alcohol use.  Triglycerides have been normal on labs in the past including a month ago.  Mild leukocytosis.  No fever, tachycardia, or signs of sepsis.  Lipase 582, normal LFTs.  CT showing acute uncomplicated pancreatitis. Keep n.p.o. at this time, continue IV fluid hydration, pain management, and antiemetic as needed (EKG ordered to check QT interval).  Trend WBC count, lipase, and LFTs.  Consult GI in the morning.  Mild hyponatremia Likely due to poor p.o. intake.  Continue IV fluid hydration and monitor labs.  Mild hypoxia She was not hypoxic on arrival to the ED (satting 98-100%) but later desatted to 87% on room air after she was given IV morphine for pain in the ED.  She is not endorsing any respiratory symptoms and lungs clear on exam.  Lower chest evaluated on CT abdomen pelvis showing minimal hypoventilatory change in the lung bases and no pleural effusion.  Continuous pulse ox, supplemental oxygen as needed.  Elevated blood pressure SBP currently in the 160s and no documented history of hypertension.  Continue pain management.  IV hydralazine PRN SBP >160.  COPD Stable, no signs of acute exacerbation.  DuoNeb as needed.  GERD IV Protonix 40 mg daily.  Hypothyroidism Continue Synthroid.  Chronic normocytic anemia Hemoglobin stable, continue to monitor labs.  DVT prophylaxis: SCDs Code Status: Full Code (discussed with the patient) Level of care: Med-Surg Admission status: It is my clinical opinion that referral for OBSERVATION is reasonable and necessary in this patient based on the above information provided. The  aforementioned taken together are felt to place the patient at high risk for further clinical deterioration. However, it is anticipated that the patient may be medically stable for discharge from the hospital within 24 to 48 hours.  John Giovanni MD [facility]  If 7PM-7AM, please contact night-coverage [email or web address]  07/26/2023, 1:08 AM

## 2023-07-26 NOTE — TOC CM/SW Note (Signed)
Transition of Care [facility]) - Inpatient Brief Assessment   Patient Details  Name: Dana Martin MRN: [id number] Date of Birth: 01/30/1966  Transition of Care [facility]) CM/SW Contact:    Harriet Masson, RN Phone Number: 07/26/2023, 12:48 PM   Clinical Narrative: Patient admitted for  recurrent pancreatitis. Patient getting IV fluids.  No TOC needs at this time.   Transition of Care Asessment: Insurance and Status: Insurance coverage has been reviewed Patient has primary care physician: Yes Home environment has been reviewed: safe to discharge home when medically stable Prior level of function:: independent Prior/Current Home Services: No current home services Social Drivers of Health Review: SDOH reviewed no interventions necessary Readmission risk has been reviewed: Yes Transition of care needs: no transition of care needs at this time

## 2023-07-26 NOTE — Progress Notes (Signed)
PROGRESS NOTE    Dana Martin  ZOX:[id number] DOB: 11-30-1965 DOA: 07/25/2023 PCP: Arnette Felts, FNP    Brief Narrative:   Dana Martin is a 57 y.o. female with past medical history significant for recurrent pancreatitis, history of cholecystectomy, COPD, GERD, hypothyroidism, hyperlipidemia presented to the ED with complaints of epigastric abdominal pain, nausea, and vomiting.  Patient states she has had pancreatitis 5 or 6 times in the past and has been seen by Dr. Elnoria Howard with GI and had extensive workup done in the past including endoscopic ultrasound but no etiology was discovered.  For the past 24 hours she is having severe epigastric abdominal pain, nausea, and vomiting.  She has also been constipated lately.  Denies fevers or chills.  States she had not consumed any alcohol for several years and then had 1 drink for Thanksgiving and another drink the following week for her son's birthday but otherwise does not drink regularly.  In the ED, temperature 98.0 F, HR 92, RR 20, BP 140/89, SpO2 98% room air. WBC 12.2, hemoglobin 11.0 (stable), MCV 82.5, sodium 134, glucose 103, creatinine 0.6, normal LFTs, lipase 582, UA with evidence of microscopic hematuria but not suggestive of infection.  CT abdomen pelvis showing acute pancreatitis with no peripancreatic collection.  Showing mild wall thickening of the duodenum and transverse colon adjacent to pancreatic inflammation, likely reactive. Patient received IV morphine, Zofran, and 1 L normal saline bolus.  She was not hypoxic on arrival to the ED (satting 98-100%) but later desatted to 87% on room air after she was given IV morphine for pain and was subsequently placed on 2 L [place].  TRH called to admit for acute recurrent pancreatitis.  Assessment & Plan:   Acute recurrent idiopathic pancreatitis Patient presenting with progressive abdominal pain associate with nausea and vomiting.  History of pancreatitis in the past, last being 3 years prior,  etiology unknown.  History of prior cholecystectomy.  Patient denies regular alcohol use.  Triglycerides have been normal on labs in the past including a month ago.  Mild leukocytosis.  No fever, tachycardia, or signs of sepsis.  Lipase 582, normal LFTs.  CT showing acute uncomplicated pancreatitis.  -- Lipase 582>568 -- N.p.o. -- D5NS at 125 mL/h -- Dilaudid 0.5-1 mg IV every 3 hours as needed moderate/severe pain -- Zofran/Compazine as needed for nausea/vomiting -- CMP, Lipase daily   Mild hyponatremia Likely due to poor p.o. intake.   -- Continue IV fluid hydration and monitor labs.   Mild hypoxia She was not hypoxic on arrival to the ED (satting 98-100%) but later desatted to 87% on room air after she was given IV morphine for pain in the ED.  She is not endorsing any respiratory symptoms and lungs clear on exam.  Lower chest evaluated on CT abdomen pelvis showing minimal hypoventilatory change in the lung bases and no pleural effusion.   -- Incentive spirometry -- Continue monitor SpO2 with vital signs -- Duo nebs every 6 hours.  Wheezing/shortness of breath -- Continue supplemental oxygen maintain SpO2 greater than 92%   Elevated blood pressure SBP currently in the 160s and no documented history of hypertension.  Continue pain management.   -- Hydralazine 5 mg IV q6h PRN SBP >160.   COPD Stable, no signs of acute exacerbation.   -- DuoNeb as needed.   GERD -- Protonix 40 mg IV daily.   Hypothyroidism -- Continue levothyroxine 175 mcg p.o. daily    Chronic normocytic anemia Hemoglobin stable, 10.8  DVT prophylaxis: Place and maintain sequential compression device Start: 07/26/23 0217    Code Status: Full Code Family Communication: No family present at bedside this morning  Disposition Plan:  Level of care: Med-Surg Status is: Inpatient Remains inpatient appropriate because: IV fluid hydration, needs further advancement of diet with toleration before stable for  discharge home, anticipate 3-4 days    Consultants:  None  Procedures:  None  Antimicrobials:  None   Subjective: Patient seen examined bedside, resting calmly.  Lying in bed.  Continues with abdominal discomfort, difficulty getting comfortable and different positions.  Pain localized to the epigastric region with radiation towards her back.  Continues with mild nausea, no further vomiting.  No other specific complaints, concerns or questions at this time.  Denies headache, no dizziness, no chest pain, no palpitations, no shortness of breath, no cough/congestion, no fever/chills/night sweats, no vomiting/diarrhea, no focal weakness, no fatigue, no paresthesias.  No acute events overnight per nursing staff.  Objective: Vitals:   07/26/23 0111 07/26/23 0406 07/26/23 0646 07/26/23 0828  BP: (!) 161/106 (!) 151/98 (!) 151/98 (!) 145/95  Pulse: 87 90 90 87  Resp: 18 18 18 18   Temp: 98.3 F (36.8 C) 98 F (36.7 C) 98 F (36.7 C) 98 F (36.7 C)  TempSrc: Oral Oral Oral   SpO2: 91% 94%  97%  Height:   5\' 5"  (1.651 m)     Intake/Output Summary (Last 24 hours) at 07/26/2023 1527 Last data filed at 07/26/2023 6440 Gross per 24 hour  Intake 1271.74 ml  Output --  Net 1271.74 ml   There were no vitals filed for this visit.  Examination:  Physical Exam: GEN: NAD, alert and oriented x 3, wd/wn HEENT: NCAT, PERRL, EOMI, sclera clear, MMM PULM: CTAB w/o wheezes/crackles, normal respiratory effort, on 2 L nasal cannula with SpO2 94% CV: RRR w/o M/G/R GI: abd soft, mild epigastric TTP, nondistended, NABS, no R/G/M MSK: no peripheral edema, muscle strength globally intact 5/5 bilateral upper/lower extremities NEURO: CN II-XII intact, no focal deficits, sensation to light touch intact PSYCH: normal mood/affect Integumentary: dry/intact, no rashes or wounds    Data Reviewed: I have personally reviewed following labs and imaging studies  CBC: Recent Labs  Lab 07/25/23 1016  07/26/23 0450  WBC 12.2* 18.9*  HGB 11.0* 10.8*  HCT 35.4* 34.3*  MCV 82.5 83.1  PLT 284 280   Basic Metabolic Panel: Recent Labs  Lab 07/25/23 1016 07/26/23 0450  NA 134* 135  K 3.8 3.4*  CL 101 103  CO2 26 26  GLUCOSE 103* 139*  BUN 8 7  CREATININE 0.60 0.47  CALCIUM 9.0 8.3*   GFR: CrCl cannot be calculated (Unknown ideal weight.). Liver Function Tests: Recent Labs  Lab 07/25/23 1016 07/26/23 0450  AST 16 15  ALT 11 12  ALKPHOS 76 70  BILITOT 0.5 0.3  PROT 7.4 7.0  ALBUMIN 3.6 3.2*   Recent Labs  Lab 07/25/23 1016 07/26/23 0450  LIPASE 582* 568*   No results for input(s): "AMMONIA" in the last 168 hours. Coagulation Profile: No results for input(s): "INR", "PROTIME" in the last 168 hours. Cardiac Enzymes: No results for input(s): "CKTOTAL", "CKMB", "CKMBINDEX", "TROPONINI" in the last 168 hours. BNP (last 3 results) No results for input(s): "PROBNP" in the last 8760 hours. HbA1C: No results for input(s): "HGBA1C" in the last 72 hours. CBG: No results for input(s): "GLUCAP" in the last 168 hours. Lipid Profile: No results for input(s): "CHOL", "HDL", "LDLCALC", "TRIG", "CHOLHDL", "  LDLDIRECT" in the last 72 hours. Thyroid Function Tests: No results for input(s): "TSH", "T4TOTAL", "FREET4", "T3FREE", "THYROIDAB" in the last 72 hours. Anemia Panel: No results for input(s): "VITAMINB12", "FOLATE", "FERRITIN", "TIBC", "IRON", "RETICCTPCT" in the last 72 hours. Sepsis Labs: No results for input(s): "PROCALCITON", "LATICACIDVEN" in the last 168 hours.  No results found for this or any previous visit (from the past 240 hours).       Radiology Studies: CT ABDOMEN PELVIS WO CONTRAST Result Date: 07/25/2023 CLINICAL DATA:  Abdominal pain.  Pancreatitis suspected. EXAM: CT ABDOMEN AND PELVIS WITHOUT CONTRAST TECHNIQUE: Multidetector CT imaging of the abdomen and pelvis was performed following the standard protocol without IV contrast. RADIATION DOSE  REDUCTION: This exam was performed according to the departmental dose-optimization program which includes automated exposure control, adjustment of the mA and/or kV according to patient size and/or use of iterative reconstruction technique. COMPARISON:  CT 07/30/2019 FINDINGS: Lower chest: Minimal hypoventilatory change in the lung bases. No pleural effusion. Hepatobiliary: Borderline hepatic steatosis. Subcentimeter hypodensity adjacent to the falciform ligament is unchanged from prior and likely small cyst. Stable cyst in the medial right lobe of the liver. Clips in the gallbladder fossa postcholecystectomy. No biliary dilatation. Pancreas: Moderate diffuse peripancreatic fat stranding and edema. No peripancreatic collection. No pancreatic air. Spleen: Unremarkable unenhanced appearance. Adrenals/Urinary Tract: No adrenal nodule. No hydronephrosis. Bilateral renal cysts. No further follow-up imaging is recommended. Punctate calcification in the upper right kidney may represent a parenchymal calcification or nonobstructing stone. Decompressed ureters. Unremarkable urinary bladder. Stomach/Bowel: Nondistended stomach. Mild duodenal wall thickening adjacent to pancreatic inflammation. Remainder of the small bowel is unremarkable and decompressed. No obstruction. There is also mild wall thickening of the transverse colon adjacent to pancreatic inflammation. Normal appendix. Colonic diverticulosis extending from the splenic flexure distally without diverticulitis. Vascular/Lymphatic: Aortic atherosclerosis without aneurysm. Cannot assess for splenic vein patency in the absence of IV contrast. No bulky abdominopelvic adenopathy. Reproductive: Status post hysterectomy. No adnexal masses. Other: Peripancreatic fat stranding and edema with ill-defined fluid tracking into the anterior left pararenal space and pericolic gutter. No organized collection. No free air. Tiny fat containing umbilical hernia. Musculoskeletal:  Moderate multilevel degenerative change in the lumbar spine. Modic endplate changes at L4-L5. There are no acute or suspicious osseous abnormalities. IMPRESSION: 1. Acute pancreatitis. No peripancreatic collection. 2. Mild wall thickening of the duodenum and transverse colon adjacent to pancreatic inflammation, likely reactive. 3. Colonic diverticulosis without diverticulitis. 4. Borderline hepatic steatosis. Aortic Atherosclerosis (ICD10-I70.0). Electronically Signed   By: Narda Rutherford M.D.   On: 07/25/2023 18:15        Scheduled Meds:  levothyroxine  175 mcg Oral Q0600   pantoprazole (PROTONIX) IV  40 mg Intravenous Q24H   Continuous Infusions:   LOS: 0 days    Time spent: 52 minutes spent on chart review, discussion with nursing staff, consultants, updating family and interview/physical exam; more than 50% of that time was spent in counseling and/or coordination of care.    Alvira Philips [place], DO [facility] Available via Epic secure chat 7am-7pm After these hours, please refer to coverage provider listed on [email or web address] 07/26/2023, 3:27 PM

## 2023-07-26 NOTE — Plan of Care (Signed)

## 2023-07-27 DIAGNOSIS — K8500 Idiopathic acute pancreatitis without necrosis or infection: Secondary | ICD-10-CM | POA: Diagnosis not present

## 2023-07-27 LAB — CBC
HCT: 33.8 % — ABNORMAL LOW (ref 36.0–46.0)
Hemoglobin: 10.4 g/dL — ABNORMAL LOW (ref 12.0–15.0)
MCH: 26.1 pg (ref 26.0–34.0)
MCHC: 30.8 g/dL (ref 30.0–36.0)
MCV: 84.9 fL (ref 80.0–100.0)
Platelets: 265 10*3/uL (ref 150–400)
RBC: 3.98 MIL/uL (ref 3.87–5.11)
RDW: 17.2 % — ABNORMAL HIGH (ref 11.5–15.5)
WBC: 31.6 10*3/uL — ABNORMAL HIGH (ref 4.0–10.5)
nRBC: 0 % (ref 0.0–0.2)

## 2023-07-27 LAB — COMPREHENSIVE METABOLIC PANEL
ALT: 11 U/L (ref 0–44)
AST: 14 U/L — ABNORMAL LOW (ref 15–41)
Albumin: 2.9 g/dL — ABNORMAL LOW (ref 3.5–5.0)
Alkaline Phosphatase: 64 U/L (ref 38–126)
Anion gap: 6 (ref 5–15)
BUN: 9 mg/dL (ref 6–20)
CO2: 28 mmol/L (ref 22–32)
Calcium: 8.6 mg/dL — ABNORMAL LOW (ref 8.9–10.3)
Chloride: 102 mmol/L (ref 98–111)
Creatinine, Ser: 0.57 mg/dL (ref 0.44–1.00)
GFR, Estimated: >60 mL/min (ref >60)
Glucose, Bld: 131 mg/dL — ABNORMAL HIGH (ref 70–99)
Potassium: 3.6 mmol/L (ref 3.5–5.1)
Sodium: 136 mmol/L (ref 135–145)
Total Bilirubin: 0.4 mg/dL (ref <1.2)
Total Protein: 6.6 g/dL (ref 6.5–8.1)

## 2023-07-27 LAB — PROCALCITONIN: Procalcitonin: 0.12 ng/mL

## 2023-07-27 LAB — LIPASE, BLOOD: Lipase: 79 U/L — ABNORMAL HIGH (ref 11–51)

## 2023-07-27 MED ORDER — IBUPROFEN 200 MG PO TABS
600.0000 mg | ORAL_TABLET | Freq: Four times a day (QID) | ORAL | Status: DC | PRN
  Administered 2023-07-27 – 2023-07-28 (×2): 600 mg via ORAL
  Filled 2023-07-27 (×2): qty 3

## 2023-07-27 MED ORDER — ACETAMINOPHEN 325 MG PO TABS: 650.0000 mg | ORAL_TABLET | Freq: Four times a day (QID) | ORAL | Status: DC | PRN

## 2023-07-27 MED ORDER — DEXTROSE-SODIUM CHLORIDE 5-0.9 % IV SOLN: INTRAVENOUS | Status: DC

## 2023-07-27 MED ORDER — OXYCODONE HCL 5 MG PO TABS
5.0000 mg | ORAL_TABLET | ORAL | Status: DC | PRN
  Administered 2023-07-27 – 2023-07-28 (×2): 5 mg via ORAL
  Filled 2023-07-27 (×2): qty 1

## 2023-07-27 MED ORDER — HYDROMORPHONE HCL 1 MG/ML IJ SOLN
0.5000 mg | INTRAMUSCULAR | Status: DC | PRN
  Administered 2023-07-27 (×2): 0.5 mg via INTRAVENOUS
  Filled 2023-07-27 (×2): qty 0.5

## 2023-07-27 NOTE — Progress Notes (Addendum)
2236: Pt placed on overnight pulse ox, pt on room air.  0505:Overnight pulse ox removed and downloaded.   0530: Printed results placed in patient's chart.

## 2023-07-27 NOTE — Progress Notes (Signed)
PROGRESS NOTE    Dana Martin  ZHY:[id number] DOB: Sep 14, 1965 DOA: 07/25/2023 PCP: Arnette Felts, FNP    Brief Narrative:   Dana Martin is a 57 y.o. female with past medical history significant for recurrent pancreatitis, history of cholecystectomy, COPD, GERD, hypothyroidism, hyperlipidemia presented to the ED with complaints of epigastric abdominal pain, nausea, and vomiting.  Patient states she has had pancreatitis 5 or 6 times in the past and has been seen by Dr. Elnoria Howard with GI and had extensive workup done in the past including endoscopic ultrasound but no etiology was discovered.  For the past 24 hours she is having severe epigastric abdominal pain, nausea, and vomiting.  She has also been constipated lately.  Denies fevers or chills.  States she had not consumed any alcohol for several years and then had 1 drink for Thanksgiving and another drink the following week for her son's birthday but otherwise does not drink regularly.  In the ED, temperature 98.0 F, HR 92, RR 20, BP 140/89, SpO2 98% room air. WBC 12.2, hemoglobin 11.0 (stable), MCV 82.5, sodium 134, glucose 103, creatinine 0.6, normal LFTs, lipase 582, UA with evidence of microscopic hematuria but not suggestive of infection.  CT abdomen pelvis showing acute pancreatitis with no peripancreatic collection.  Showing mild wall thickening of the duodenum and transverse colon adjacent to pancreatic inflammation, likely reactive. Patient received IV morphine, Zofran, and 1 L normal saline bolus.  She was not hypoxic on arrival to the ED (satting 98-100%) but later desatted to 87% on room air after she was given IV morphine for pain and was subsequently placed on 2 L [place].  TRH called to admit for acute recurrent pancreatitis.  Assessment & Plan:   Acute recurrent idiopathic pancreatitis Patient presenting with progressive abdominal pain associate with nausea and vomiting.  History of pancreatitis in the past, last being 3 years prior,  etiology unknown.  History of prior cholecystectomy.  Patient denies regular alcohol use.  Triglycerides have been normal on labs in the past including a month ago.  Mild leukocytosis.  No fever, tachycardia, or signs of sepsis.  Lipase 582, normal LFTs.  CT showing acute uncomplicated pancreatitis.  -- Lipase 432 024 8724 -- Clear liquid diet; will advance further as tolerates -- D5NS at 75 mL/h -- Oxycodone 5 mg p.o. every 4 hours as needed moderate pain -- Dilaudid 0.5 mg IV every 3 hours as needed severe pain -- Zofran/Compazine as needed for nausea/vomiting -- CMP, Lipase daily  Leukocytosis -- WBC 12.2>18.9>31.6 -- No focal infectious etiology noted, suspect reactive from acute pancreatitis, no necrosis or abscess/pseudocyst noted on imaging -- Procalcitonin 0.12, reassuring -- CBC w/ Diff in the a.m., repeat procalcitonin   Mild hyponatremia Likely due to poor p.o. intake.   -- Continue IV fluid hydration and monitor labs.   Mild hypoxia She was not hypoxic on arrival to the ED (satting 98-100%) but later desatted to 87% on room air after she was given IV morphine for pain in the ED.  She is not endorsing any respiratory symptoms and lungs clear on exam.  Lower chest evaluated on CT abdomen pelvis showing minimal hypoventilatory change in the lung bases and no pleural effusion.   -- Incentive spirometry -- Continue monitor SpO2 with vital signs -- Duo nebs every 6 hours.  Wheezing/shortness of breath -- Continue supplemental oxygen maintain SpO2 greater than 92%   Elevated blood pressure SBP currently in the 160s and no documented history of hypertension.  Continue pain management.   --  Hydralazine 5 mg IV q6h PRN SBP >160.   COPD Stable, no signs of acute exacerbation.   -- DuoNeb as needed.   GERD -- Protonix 40 mg IV daily.   Hypothyroidism -- Continue levothyroxine 175 mcg p.o. daily    Chronic normocytic anemia Hemoglobin stable, 10.8    DVT prophylaxis: Place  and maintain sequential compression device Start: 07/26/23 0217    Code Status: Full Code Family Communication: No family present at bedside this morning  Disposition Plan:  Level of care: Med-Surg Status is: Inpatient Remains inpatient appropriate because: IV fluid hydration, needs further advancement of diet with toleration before stable for discharge home, anticipate 3-4 days    Consultants:  None  Procedures:  None  Antimicrobials:  None   Subjective: Patient seen examined bedside, resting calmly.  Lying in bed.  Abdominal discomfort improving.  Tolerating small sips of water.  Agreeable to start clear liquid diet today.  Lipase markedly improved.  White count continues to trend up, denies any dysuria, no cough/congestion, no rash, no wounds.  No other specific complaints, concerns or questions at this time.  Denies headache, no dizziness, no chest pain, no palpitations, no shortness of breath, no cough/congestion, no fever/chills/night sweats, no vomiting/diarrhea, no focal weakness, no fatigue, no paresthesias.  No acute events overnight per nursing staff.  Objective: Vitals:   07/26/23 1638 07/26/23 1958 07/27/23 0512 07/27/23 0827  BP: (!) 147/95 112/81 139/84 138/89  Pulse: (!) 108 (!) 102 (!) 107 (!) 106  Resp: 18 18 18 18   Temp: 98 F (36.7 C) 99.9 F (37.7 C) 98.5 F (36.9 C) 98 F (36.7 C)  TempSrc:  Oral Oral   SpO2: 97% 100% 98% 99%  Height:       No intake or output data in the 24 hours ending 07/27/23 1245  There were no vitals filed for this visit.  Examination:  Physical Exam: GEN: NAD, alert and oriented x 3, wd/wn HEENT: NCAT, PERRL, EOMI, sclera clear, MMM PULM: CTAB w/o wheezes/crackles, normal respiratory effort, on 2 L nasal cannula with SpO2 98% CV: RRR w/o M/G/R GI: abd soft, mild epigastric TTP, nondistended, NABS, no R/G/M MSK: no peripheral edema, muscle strength globally intact 5/5 bilateral upper/lower extremities NEURO: CN II-XII  intact, no focal deficits, sensation to light touch intact PSYCH: normal mood/affect Integumentary: dry/intact, no rashes or wounds    Data Reviewed: I have personally reviewed following labs and imaging studies  CBC: Recent Labs  Lab 07/25/23 1016 07/26/23 0450 07/27/23 0428  WBC 12.2* 18.9* 31.6*  HGB 11.0* 10.8* 10.4*  HCT 35.4* 34.3* 33.8*  MCV 82.5 83.1 84.9  PLT 284 280 265   Basic Metabolic Panel: Recent Labs  Lab 07/25/23 1016 07/26/23 0450 07/27/23 0428  NA 134* 135 136  K 3.8 3.4* 3.6  CL 101 103 102  CO2 26 26 28   GLUCOSE 103* 139* 131*  BUN 8 7 9   CREATININE 0.60 0.47 0.57  CALCIUM 9.0 8.3* 8.6*   GFR: CrCl cannot be calculated (Unknown ideal weight.). Liver Function Tests: Recent Labs  Lab 07/25/23 1016 07/26/23 0450 07/27/23 0428  AST 16 15 14*  ALT 11 12 11   ALKPHOS 76 70 64  BILITOT 0.5 0.3 0.4  PROT 7.4 7.0 6.6  ALBUMIN 3.6 3.2* 2.9*   Recent Labs  Lab 07/25/23 1016 07/26/23 0450 07/27/23 0428  LIPASE 582* 568* 79*   No results for input(s): "AMMONIA" in the last 168 hours. Coagulation Profile: No results for input(s): "INR", "  PROTIME" in the last 168 hours. Cardiac Enzymes: No results for input(s): "CKTOTAL", "CKMB", "CKMBINDEX", "TROPONINI" in the last 168 hours. BNP (last 3 results) No results for input(s): "PROBNP" in the last 8760 hours. HbA1C: No results for input(s): "HGBA1C" in the last 72 hours. CBG: No results for input(s): "GLUCAP" in the last 168 hours. Lipid Profile: No results for input(s): "CHOL", "HDL", "LDLCALC", "TRIG", "CHOLHDL", "LDLDIRECT" in the last 72 hours. Thyroid Function Tests: No results for input(s): "TSH", "T4TOTAL", "FREET4", "T3FREE", "THYROIDAB" in the last 72 hours. Anemia Panel: No results for input(s): "VITAMINB12", "FOLATE", "FERRITIN", "TIBC", "IRON", "RETICCTPCT" in the last 72 hours. Sepsis Labs: Recent Labs  Lab 07/27/23 0935  PROCALCITON 0.12    No results found for this or any  previous visit (from the past 240 hours).       Radiology Studies: CT ABDOMEN PELVIS WO CONTRAST Result Date: 07/25/2023 CLINICAL DATA:  Abdominal pain.  Pancreatitis suspected. EXAM: CT ABDOMEN AND PELVIS WITHOUT CONTRAST TECHNIQUE: Multidetector CT imaging of the abdomen and pelvis was performed following the standard protocol without IV contrast. RADIATION DOSE REDUCTION: This exam was performed according to the departmental dose-optimization program which includes automated exposure control, adjustment of the mA and/or kV according to patient size and/or use of iterative reconstruction technique. COMPARISON:  CT 07/30/2019 FINDINGS: Lower chest: Minimal hypoventilatory change in the lung bases. No pleural effusion. Hepatobiliary: Borderline hepatic steatosis. Subcentimeter hypodensity adjacent to the falciform ligament is unchanged from prior and likely small cyst. Stable cyst in the medial right lobe of the liver. Clips in the gallbladder fossa postcholecystectomy. No biliary dilatation. Pancreas: Moderate diffuse peripancreatic fat stranding and edema. No peripancreatic collection. No pancreatic air. Spleen: Unremarkable unenhanced appearance. Adrenals/Urinary Tract: No adrenal nodule. No hydronephrosis. Bilateral renal cysts. No further follow-up imaging is recommended. Punctate calcification in the upper right kidney may represent a parenchymal calcification or nonobstructing stone. Decompressed ureters. Unremarkable urinary bladder. Stomach/Bowel: Nondistended stomach. Mild duodenal wall thickening adjacent to pancreatic inflammation. Remainder of the small bowel is unremarkable and decompressed. No obstruction. There is also mild wall thickening of the transverse colon adjacent to pancreatic inflammation. Normal appendix. Colonic diverticulosis extending from the splenic flexure distally without diverticulitis. Vascular/Lymphatic: Aortic atherosclerosis without aneurysm. Cannot assess for splenic  vein patency in the absence of IV contrast. No bulky abdominopelvic adenopathy. Reproductive: Status post hysterectomy. No adnexal masses. Other: Peripancreatic fat stranding and edema with ill-defined fluid tracking into the anterior left pararenal space and pericolic gutter. No organized collection. No free air. Tiny fat containing umbilical hernia. Musculoskeletal: Moderate multilevel degenerative change in the lumbar spine. Modic endplate changes at L4-L5. There are no acute or suspicious osseous abnormalities. IMPRESSION: 1. Acute pancreatitis. No peripancreatic collection. 2. Mild wall thickening of the duodenum and transverse colon adjacent to pancreatic inflammation, likely reactive. 3. Colonic diverticulosis without diverticulitis. 4. Borderline hepatic steatosis. Aortic Atherosclerosis (ICD10-I70.0). Electronically Signed   By: Narda Rutherford M.D.   On: 07/25/2023 18:15        Scheduled Meds:  levothyroxine  175 mcg Oral Q0600   pantoprazole (PROTONIX) IV  40 mg Intravenous Q24H   Continuous Infusions:  dextrose 5 % and 0.9 % NaCl 125 mL/hr at 07/27/23 1140     LOS: 1 day    Time spent: 52 minutes spent on chart review, discussion with nursing staff, consultants, updating family and interview/physical exam; more than 50% of that time was spent in counseling and/or coordination of care.    Alvira Philips [place], DO [facility] Available via Epic secure chat 7am-7pm After these hours, please refer to coverage provider listed on [email or web address] 07/27/2023, 12:45 PM

## 2023-07-27 NOTE — Plan of Care (Signed)

## 2023-07-27 NOTE — Plan of Care (Signed)
A/ox4 and on 3L nasal cannula. Prn iv dilaudid given. Running fluids at 125. New piv placed. No other issues    Problem: Education: Goal: Knowledge of General Education information will improve Description: Including pain rating scale, medication(s)/side effects and non-pharmacologic comfort measures Outcome: Progressing   Problem: Health Behavior/Discharge Planning: Goal: Ability to manage health-related needs will improve Outcome: Progressing   Problem: Clinical Measurements: Goal: Ability to maintain clinical measurements within normal limits will improve Outcome: Progressing Goal: Will remain free from infection Outcome: Progressing Goal: Diagnostic test results will improve Outcome: Progressing Goal: Respiratory complications will improve Outcome: Progressing Goal: Cardiovascular complication will be avoided Outcome: Progressing   Problem: Activity: Goal: Risk for activity intolerance will decrease Outcome: Progressing   Problem: Nutrition: Goal: Adequate nutrition will be maintained Outcome: Progressing   Problem: Coping: Goal: Level of anxiety will decrease Outcome: Progressing   Problem: Elimination: Goal: Will not experience complications related to bowel motility Outcome: Progressing Goal: Will not experience complications related to urinary retention Outcome: Progressing   Problem: Pain Management: Goal: General experience of comfort will improve Outcome: Progressing   Problem: Safety: Goal: Ability to remain free from injury will improve Outcome: Progressing   Problem: Skin Integrity: Goal: Risk for impaired skin integrity will decrease Outcome: Progressing

## 2023-07-27 NOTE — Progress Notes (Signed)
   07/27/23 1243  Mobility  Activity Ambulated independently in hallway  Level of Assistance Independent  Assistive Device Other (Comment) (IV Pole)  Distance Ambulated (ft) 100 ft  Activity Response Tolerated fair  Mobility Referral Yes  Mobility visit 1 Mobility  Mobility Specialist Start Time (ACUTE ONLY) 1233  Mobility Specialist Stop Time (ACUTE ONLY) 1243  Mobility Specialist Time Calculation (min) (ACUTE ONLY) 10 min   Mobility Specialist: Progress Note.  Post-Mobility: HR 111, SpO2 90% RA  Pt agreeable to mobility session - received in bed. Required Ind using IV Pole. C/o abd soreness, pounding headache, and stated it "hurts to breathe" when taking deep breaths for SPO2 recovery - RN notified via secure chat. Returned to EOB with all needs met - call bell within reach. Husband present.    Barnie Mort, BS Mobility Specialist Please contact via SecureChat or Rehab office at [phone].

## 2023-07-28 DIAGNOSIS — K8500 Idiopathic acute pancreatitis without necrosis or infection: Secondary | ICD-10-CM | POA: Diagnosis not present

## 2023-07-28 LAB — COMPREHENSIVE METABOLIC PANEL
ALT: 11 U/L (ref 0–44)
AST: 13 U/L — ABNORMAL LOW (ref 15–41)
Albumin: 2.6 g/dL — ABNORMAL LOW (ref 3.5–5.0)
Alkaline Phosphatase: 75 U/L (ref 38–126)
Anion gap: 3 — ABNORMAL LOW (ref 5–15)
BUN: 9 mg/dL (ref 6–20)
CO2: 30 mmol/L (ref 22–32)
Calcium: 8.6 mg/dL — ABNORMAL LOW (ref 8.9–10.3)
Chloride: 104 mmol/L (ref 98–111)
Creatinine, Ser: 0.77 mg/dL (ref 0.44–1.00)
GFR, Estimated: >60 mL/min (ref >60)
Glucose, Bld: 109 mg/dL — ABNORMAL HIGH (ref 70–99)
Potassium: 3.1 mmol/L — ABNORMAL LOW (ref 3.5–5.1)
Sodium: 137 mmol/L (ref 135–145)
Total Bilirubin: 0.3 mg/dL (ref <1.2)
Total Protein: 6.3 g/dL — ABNORMAL LOW (ref 6.5–8.1)

## 2023-07-28 LAB — LIPASE, BLOOD: Lipase: 40 U/L (ref 11–51)

## 2023-07-28 LAB — CBC WITH DIFFERENTIAL/PLATELET
Abs Immature Granulocytes: 0.25 10*3/uL — ABNORMAL HIGH (ref 0.00–0.07)
Basophils Absolute: 0.1 10*3/uL (ref 0.0–0.1)
Basophils Relative: 0 %
Eosinophils Absolute: 0 10*3/uL (ref 0.0–0.5)
Eosinophils Relative: 0 %
HCT: 31.7 % — ABNORMAL LOW (ref 36.0–46.0)
Hemoglobin: 9.9 g/dL — ABNORMAL LOW (ref 12.0–15.0)
Immature Granulocytes: 1 %
Lymphocytes Relative: 3 %
Lymphs Abs: 0.7 10*3/uL (ref 0.7–4.0)
MCH: 26.1 pg (ref 26.0–34.0)
MCHC: 31.2 g/dL (ref 30.0–36.0)
MCV: 83.6 fL (ref 80.0–100.0)
Monocytes Absolute: 1.4 10*3/uL — ABNORMAL HIGH (ref 0.1–1.0)
Monocytes Relative: 6 %
Neutro Abs: 21.7 10*3/uL — ABNORMAL HIGH (ref 1.7–7.7)
Neutrophils Relative %: 90 %
Platelets: 268 10*3/uL (ref 150–400)
RBC: 3.79 MIL/uL — ABNORMAL LOW (ref 3.87–5.11)
RDW: 17.2 % — ABNORMAL HIGH (ref 11.5–15.5)
WBC: 24.2 10*3/uL — ABNORMAL HIGH (ref 4.0–10.5)
nRBC: 0 % (ref 0.0–0.2)

## 2023-07-28 LAB — MAGNESIUM: Magnesium: 1.8 mg/dL (ref 1.7–2.4)

## 2023-07-28 MED ORDER — POTASSIUM CHLORIDE CRYS ER 20 MEQ PO TBCR
30.0000 meq | EXTENDED_RELEASE_TABLET | ORAL | Status: AC
  Administered 2023-07-28 (×3): 30 meq via ORAL
  Filled 2023-07-28 (×3): qty 2

## 2023-07-28 MED ORDER — PANTOPRAZOLE SODIUM 40 MG PO TBEC
40.0000 mg | DELAYED_RELEASE_TABLET | Freq: Every day | ORAL | Status: DC
  Administered 2023-07-29: 40 mg via ORAL
  Filled 2023-07-28: qty 1

## 2023-07-28 MED ORDER — MELATONIN 5 MG PO TABS
5.0000 mg | ORAL_TABLET | Freq: Every evening | ORAL | Status: DC | PRN
  Administered 2023-07-28: 5 mg via ORAL
  Filled 2023-07-28: qty 1

## 2023-07-28 MED ORDER — POLYETHYLENE GLYCOL 3350 17 G PO PACK
17.0000 g | PACK | Freq: Every day | ORAL | Status: DC | PRN
  Administered 2023-07-28: 17 g via ORAL
  Filled 2023-07-28: qty 1

## 2023-07-28 MED ORDER — IBUPROFEN 200 MG PO TABS
600.0000 mg | ORAL_TABLET | Freq: Four times a day (QID) | ORAL | Status: DC | PRN
  Administered 2023-07-28 – 2023-07-29 (×3): 600 mg via ORAL
  Filled 2023-07-28 (×3): qty 3

## 2023-07-28 MED ORDER — LORATADINE 10 MG PO TABS
10.0000 mg | ORAL_TABLET | Freq: Every day | ORAL | Status: DC
  Administered 2023-07-28: 10 mg via ORAL
  Filled 2023-07-28: qty 1

## 2023-07-28 NOTE — Progress Notes (Signed)
Mobility Specialist Progress Note;   07/28/23 1435  Mobility  Activity Ambulated independently in hallway  Level of Assistance Standby assist, set-up cues, supervision of patient - no hands on  Assistive Device None  Distance Ambulated (ft) 150 ft  Activity Response Tolerated well  Mobility Referral Yes  Mobility visit 1 Mobility  Mobility Specialist Start Time (ACUTE ONLY) 1435  Mobility Specialist Stop Time (ACUTE ONLY) 1445  Mobility Specialist Time Calculation (min) (ACUTE ONLY) 10 min   Pt eager for mobility. Required no physical assistance during ambulation, SV. No c/o pain when asked, just feeling bloated. Pt returned back to bed with all needs met. Husband in room.   Caesar Bookman Mobility Specialist Please contact via SecureChat or [employer] [phone]

## 2023-07-28 NOTE — Progress Notes (Signed)
PHARMACIST - PHYSICIAN COMMUNICATION  DR:   [place]  CONCERNING: IV to Oral Route Change Policy  RECOMMENDATION: This patient is receiving pantoprazole by the intravenous route.  Based on criteria approved by the Pharmacy and Therapeutics Committee, the intravenous medication(s) is/are being converted to the equivalent oral dose form(s).   DESCRIPTION: These criteria include: The patient is eating (either orally or via tube) and/or has been taking other orally administered medications for a least 24 hours The patient has no evidence of active gastrointestinal bleeding or impaired GI absorption (gastrectomy, short bowel, patient on TNA or NPO).  If you have questions about this conversion, please contact the Pharmacy Department  []   [phone] )  [facility] []   [phone] )  [facility] [x]   [phone] )  [facility] []   [phone] )  [facility] []   [phone] )  [facility]   Rexford Maus, PharmD, BCPS 07/28/2023 1:13 PM

## 2023-07-28 NOTE — Progress Notes (Addendum)
PROGRESS NOTE    Dana Martin  ZOX:[id number] DOB: [birth year] DOA: 07/25/2023 PCP: Arnette Felts, FNP    Brief Narrative:   Dana Martin is a 57 y.o. female with past medical history significant for recurrent pancreatitis, history of cholecystectomy, COPD, GERD, hypothyroidism, hyperlipidemia presented to the ED with complaints of epigastric abdominal pain, nausea, and vomiting.  Patient states she has had pancreatitis 5 or 6 times in the past and has been seen by Dr. Elnoria Howard with GI and had extensive workup done in the past including endoscopic ultrasound but no etiology was discovered.  For the past 24 hours she is having severe epigastric abdominal pain, nausea, and vomiting.  She has also been constipated lately.  Denies fevers or chills.  States she had not consumed any alcohol for several years and then had 1 drink for Thanksgiving and another drink the following week for her son's birthday but otherwise does not drink regularly.  In the ED, temperature 98.0 F, HR 92, RR 20, BP 140/89, SpO2 98% room air. WBC 12.2, hemoglobin 11.0 (stable), MCV 82.5, sodium 134, glucose 103, creatinine 0.6, normal LFTs, lipase 582, UA with evidence of microscopic hematuria but not suggestive of infection.  CT abdomen pelvis showing acute pancreatitis with no peripancreatic collection.  Showing mild wall thickening of the duodenum and transverse colon adjacent to pancreatic inflammation, likely reactive. Patient received IV morphine, Zofran, and 1 L normal saline bolus.  She was not hypoxic on arrival to the ED (satting 98-100%) but later desatted to 87% on room air after she was given IV morphine for pain and was subsequently placed on 2 L .  [facility] called to admit for acute recurrent pancreatitis.  Assessment & Plan:   Acute recurrent idiopathic pancreatitis Patient presenting with progressive abdominal pain associate with nausea and vomiting.  History of pancreatitis in the past, last being 3 years prior,  etiology unknown.  History of prior cholecystectomy.  Patient denies regular alcohol use.  Triglycerides have been normal on labs in the past including a month ago.  Mild leukocytosis.  No fever, tachycardia, or signs of sepsis.  Lipase 582, normal LFTs.  CT showing acute uncomplicated pancreatitis.  -- Lipase [phone] -- Advance to full liquid diet; will advance further as tolerates -- DC IV fluids today -- Ibuprofen 600 mg p.o. every 6 hours.  Fever/headache/mild pain -- Oxycodone 5 mg p.o. every 4 hours as needed moderate pain -- Dilaudid 0.5 mg IV every 3 hours as needed severe pain -- Zofran/Compazine as needed for nausea/vomiting -- CMP, Lipase daily  Leukocytosis, likely reactive from acute pancreatitis -- WBC 12.2>18.9>31.6>24.2 -- No focal infectious etiology noted, suspect reactive from acute pancreatitis, no necrosis or abscess/pseudocyst noted on imaging -- Procalcitonin 0.12, reassuring -- CBC w/ Diff in the a.m., repeat procalcitonin   Mild hyponatremia: Resolved Likely due to poor p.o. intake.   -- Continue IV fluid hydration and monitor labs.  Hypokalemia Potassium 3.1, will replete. --Repeat electrolytes and magnesium   Mild hypoxia Suspect obstructive sleep apnea She was not hypoxic on arrival to the ED (satting 98-100%) but later desatted to 87% on room air after she was given IV morphine for pain in the ED.  She is not endorsing any respiratory symptoms and lungs clear on exam.  Lower chest evaluated on CT abdomen pelvis showing minimal hypoventilatory change in the lung bases and no pleural effusion.   -- Incentive spirometry -- Continue monitor SpO2 with vital signs -- Duo nebs every 6 hours.  Wheezing/shortness of breath -- Continue supplemental oxygen maintain SpO2 greater than 92% --Recommend outpatient sleep study   Elevated blood pressure SBP currently in the 160s and no documented history of hypertension.  Continue pain management.   -- Hydralazine  5 mg IV q6h PRN SBP >160.   COPD Stable, no signs of acute exacerbation.   -- DuoNeb as needed.   GERD -- Protonix 40 mg IV daily.   Hypothyroidism -- Continue levothyroxine 175 mcg p.o. daily    Chronic normocytic anemia Hemoglobin stable, 10.8    DVT prophylaxis: Place and maintain sequential compression device Start: 07/26/23 0217    Code Status: Full Code Family Communication: No family present at bedside this morning  Disposition Plan:  Level of care: Med-Surg Status is: Inpatient Remains inpatient appropriate because: IV fluid hydration, needs further advancement of diet with toleration before stable for discharge home, possible discharge home in 1-2 days    Consultants:  None  Procedures:  None  Antimicrobials:  None   Subjective: Patient seen examined bedside, resting calmly.  Sitting at edge of bed.  Abdominal pain improved.  Tolerating clear liquid diet.  Will advance to full liquids today.  Discontinuing IV fluids.  Lipase now within normal limits.  Hopeful for advancement to soft diet tomorrow possible discharge home.  No other specific questions, concerns or complaints at this time.   Denies headache, no dizziness, no chest pain, no palpitations, no shortness of breath, no cough/congestion, no fever/chills/night sweats, no vomiting/diarrhea, no focal weakness, no fatigue, no paresthesias.  No acute events overnight per nursing staff.  Objective: Vitals:   07/27/23 1700 07/27/23 2049 07/28/23 0457 07/28/23 0857  BP: (!) 142/67 (!) 155/96 139/86 139/86  Pulse: (!) 113 (!) 110 89 91  Resp: 18 17 17 18   Temp: 98 F (36.7 C) 99.6 F (37.6 C) 98.2 F (36.8 C) (!) 97.5 F (36.4 C)  TempSrc:  Oral Oral Oral  SpO2: (!) 77% 98% 97% 95%  Height:       No intake or output data in the 24 hours ending 07/28/23 1333  There were no vitals filed for this visit.  Examination:  Physical Exam: GEN: NAD, alert and oriented x 3, wd/wn HEENT: NCAT, PERRL, EOMI,  sclera clear, MMM PULM: CTAB w/o wheezes/crackles, normal respiratory effort, on 2 L nasal cannula with SpO2 98% CV: RRR w/o M/G/R GI: abd soft, mild epigastric TTP, nondistended, NABS, no R/G/M MSK: no peripheral edema, muscle strength globally intact 5/5 bilateral upper/lower extremities NEURO: CN II-XII intact, no focal deficits, sensation to light touch intact PSYCH: normal mood/affect Integumentary: dry/intact, no rashes or wounds    Data Reviewed: I have personally reviewed following labs and imaging studies  CBC: Recent Labs  Lab 07/25/23 1016 07/26/23 0450 07/27/23 0428 07/28/23 0535  WBC 12.2* 18.9* 31.6* 24.2*  NEUTROABS  --   --   --  21.7*  HGB 11.0* 10.8* 10.4* 9.9*  HCT 35.4* 34.3* 33.8* 31.7*  MCV 82.5 83.1 84.9 83.6  PLT 284 280 265 268   Basic Metabolic Panel: Recent Labs  Lab 07/25/23 1016 07/26/23 0450 07/27/23 0428 07/28/23 0535  NA 134* 135 136 137  K 3.8 3.4* 3.6 3.1*  CL 101 103 102 104  CO2 26 26 28 30   GLUCOSE 103* 139* 131* 109*  BUN 8 7 9 9   CREATININE 0.60 0.47 0.57 0.77  CALCIUM 9.0 8.3* 8.6* 8.6*  MG  --   --   --  1.8   GFR:  CrCl cannot be calculated (Unknown ideal weight.). Liver Function Tests: Recent Labs  Lab 07/25/23 1016 07/26/23 0450 07/27/23 0428 07/28/23 0535  AST 16 15 14* 13*  ALT 11 12 11 11   ALKPHOS 76 70 64 75  BILITOT 0.5 0.3 0.4 0.3  PROT 7.4 7.0 6.6 6.3*  ALBUMIN 3.6 3.2* 2.9* 2.6*   Recent Labs  Lab 07/25/23 1016 07/26/23 0450 07/27/23 0428 07/28/23 0535  LIPASE 582* 568* 79* 40   No results for input(s): "AMMONIA" in the last 168 hours. Coagulation Profile: No results for input(s): "INR", "PROTIME" in the last 168 hours. Cardiac Enzymes: No results for input(s): "CKTOTAL", "CKMB", "CKMBINDEX", "TROPONINI" in the last 168 hours. BNP (last 3 results) No results for input(s): "PROBNP" in the last 8760 hours. HbA1C: No results for input(s): "HGBA1C" in the last 72 hours. CBG: No results for  input(s): "GLUCAP" in the last 168 hours. Lipid Profile: No results for input(s): "CHOL", "HDL", "LDLCALC", "TRIG", "CHOLHDL", "LDLDIRECT" in the last 72 hours. Thyroid Function Tests: No results for input(s): "TSH", "T4TOTAL", "FREET4", "T3FREE", "THYROIDAB" in the last 72 hours. Anemia Panel: No results for input(s): "VITAMINB12", "FOLATE", "FERRITIN", "TIBC", "IRON", "RETICCTPCT" in the last 72 hours. Sepsis Labs: Recent Labs  Lab 07/27/23 0935  PROCALCITON 0.12    No results found for this or any previous visit (from the past 240 hours).       Radiology Studies: No results found.       Scheduled Meds:  levothyroxine  175 mcg Oral Q0600   [START ON 07/29/2023] pantoprazole  40 mg Oral Daily   potassium chloride  30 mEq Oral Q3H   Continuous Infusions:     LOS: 2 days    Time spent: 52 minutes spent on chart review, discussion with nursing staff, consultants, updating family and interview/physical exam; more than 50% of that time was spent in counseling and/or coordination of care.    Alvira Philips [place], DO [facility] Available via Epic secure chat 7am-7pm After these hours, please refer to coverage provider listed on [email or web address] 07/28/2023, 1:33 PM

## 2023-07-28 NOTE — Plan of Care (Signed)
A/Ox4. Abdominal pain better controlled this shift, but continues to have a headache. Up with self care. Cont pulse ox. Wears 2L O2 when asleep.   Problem: Education: Goal: Knowledge of General Education information will improve Description: Including pain rating scale, medication(s)/side effects and non-pharmacologic comfort measures Outcome: Progressing   Problem: Health Behavior/Discharge Planning: Goal: Ability to manage health-related needs will improve Outcome: Progressing   Problem: Clinical Measurements: Goal: Ability to maintain clinical measurements within normal limits will improve Outcome: Progressing Goal: Will remain free from infection Outcome: Progressing Goal: Diagnostic test results will improve Outcome: Progressing Goal: Respiratory complications will improve Outcome: Progressing Goal: Cardiovascular complication will be avoided Outcome: Progressing   Problem: Activity: Goal: Risk for activity intolerance will decrease Outcome: Progressing   Problem: Nutrition: Goal: Adequate nutrition will be maintained Outcome: Progressing   Problem: Coping: Goal: Level of anxiety will decrease Outcome: Progressing   Problem: Elimination: Goal: Will not experience complications related to bowel motility Outcome: Progressing Goal: Will not experience complications related to urinary retention Outcome: Progressing   Problem: Pain Management: Goal: General experience of comfort will improve Outcome: Progressing   Problem: Safety: Goal: Ability to remain free from injury will improve Outcome: Progressing   Problem: Skin Integrity: Goal: Risk for impaired skin integrity will decrease Outcome: Progressing

## 2023-07-28 NOTE — Plan of Care (Signed)
Patient alert/oriented X4. Patient compliant with medication administration and ambulated around room/down hall unassisted. Patient administered advil as needed for pain. Patient VSS, no complaints at this time.   Problem: Education: Goal: Knowledge of General Education information will improve Description: Including pain rating scale, medication(s)/side effects and non-pharmacologic comfort measures Outcome: Progressing   Problem: Health Behavior/Discharge Planning: Goal: Ability to manage health-related needs will improve Outcome: Progressing   Problem: Clinical Measurements: Goal: Ability to maintain clinical measurements within normal limits will improve Outcome: Progressing   Problem: Clinical Measurements: Goal: Will remain free from infection Outcome: Progressing   Problem: Clinical Measurements: Goal: Will remain free from infection Outcome: Progressing   Problem: Clinical Measurements: Goal: Respiratory complications will improve Outcome: Progressing   Problem: Clinical Measurements: Goal: Cardiovascular complication will be avoided Outcome: Progressing   Problem: Activity: Goal: Risk for activity intolerance will decrease Outcome: Progressing   Problem: Nutrition: Goal: Adequate nutrition will be maintained Outcome: Progressing   Problem: Coping: Goal: Level of anxiety will decrease Outcome: Progressing   Problem: Elimination: Goal: Will not experience complications related to bowel motility Outcome: Progressing   Problem: Pain Management: Goal: General experience of comfort will improve Outcome: Progressing   Problem: Safety: Goal: Ability to remain free from injury will improve Outcome: Progressing   Problem: Skin Integrity: Goal: Risk for impaired skin integrity will decrease Outcome: Progressing

## 2023-07-29 DIAGNOSIS — K8500 Idiopathic acute pancreatitis without necrosis or infection: Secondary | ICD-10-CM | POA: Diagnosis not present

## 2023-07-29 LAB — CBC
HCT: 29.3 % — ABNORMAL LOW (ref 36.0–46.0)
Hemoglobin: 9 g/dL — ABNORMAL LOW (ref 12.0–15.0)
MCH: 25.9 pg — ABNORMAL LOW (ref 26.0–34.0)
MCHC: 30.7 g/dL (ref 30.0–36.0)
MCV: 84.4 fL (ref 80.0–100.0)
Platelets: 263 10*3/uL (ref 150–400)
RBC: 3.47 MIL/uL — ABNORMAL LOW (ref 3.87–5.11)
RDW: 17.1 % — ABNORMAL HIGH (ref 11.5–15.5)
WBC: 16.4 10*3/uL — ABNORMAL HIGH (ref 4.0–10.5)
nRBC: 0 % (ref 0.0–0.2)

## 2023-07-29 LAB — COMPREHENSIVE METABOLIC PANEL
ALT: 14 U/L (ref 0–44)
AST: 12 U/L — ABNORMAL LOW (ref 15–41)
Albumin: 2.4 g/dL — ABNORMAL LOW (ref 3.5–5.0)
Alkaline Phosphatase: 80 U/L (ref 38–126)
Anion gap: 3 — ABNORMAL LOW (ref 5–15)
BUN: 10 mg/dL (ref 6–20)
CO2: 27 mmol/L (ref 22–32)
Calcium: 8.2 mg/dL — ABNORMAL LOW (ref 8.9–10.3)
Chloride: 103 mmol/L (ref 98–111)
Creatinine, Ser: 0.43 mg/dL — ABNORMAL LOW (ref 0.44–1.00)
GFR, Estimated: >60 mL/min (ref >60)
Glucose, Bld: 96 mg/dL (ref 70–99)
Potassium: 3.6 mmol/L (ref 3.5–5.1)
Sodium: 133 mmol/L — ABNORMAL LOW (ref 135–145)
Total Bilirubin: 0.5 mg/dL (ref <1.2)
Total Protein: 6.2 g/dL — ABNORMAL LOW (ref 6.5–8.1)

## 2023-07-29 LAB — LIPASE, BLOOD: Lipase: 33 U/L (ref 11–51)

## 2023-07-29 LAB — MAGNESIUM: Magnesium: 1.8 mg/dL (ref 1.7–2.4)

## 2023-07-29 NOTE — Plan of Care (Signed)

## 2023-07-29 NOTE — Discharge Summary (Signed)
Physician Discharge Summary  Dana Martin OVF:[id number] DOB: 14-Apr-1966 DOA: 07/25/2023  PCP: Arnette Felts, FNP  Admit date: 07/25/2023 Discharge date: 07/29/2023  Admitted From: Home Disposition: Home  Recommendations for Outpatient Follow-up:  Follow up with PCP in 1-2 weeks Follow-up with gastroenterology, Dr. Elnoria Howard 2-3 weeks Recommend repeat CBC 1 week to ensure resolution of leukocytosis, likely reactive in the setting of acute pancreatitis Recommend outpatient referral for sleep study for suspected obstructive sleep apnea  Home Health: No Equipment/Devices: None  Discharge Condition: Stable CODE STATUS: Full code Diet recommendation: Bland diet, slowly advance as tolerates  History of present illness:  Dana Martin is a 57 y.o. female with past medical history significant for recurrent pancreatitis, history of cholecystectomy, COPD, GERD, hypothyroidism, hyperlipidemia presented to the ED with complaints of epigastric abdominal pain, nausea, and vomiting.  Patient states she has had pancreatitis 5 or 6 times in the past and has been seen by Dr. Elnoria Howard with GI and had extensive workup done in the past including endoscopic ultrasound but no etiology was discovered.  For the past 24 hours she is having severe epigastric abdominal pain, nausea, and vomiting.  She has also been constipated lately.  Denies fevers or chills.  States she had not consumed any alcohol for several years and then had 1 drink for Thanksgiving and another drink the following week for her son's birthday but otherwise does not drink regularly.   In the ED, temperature 98.0 F, HR 92, RR 20, BP 140/89, SpO2 98% room air. WBC 12.2, hemoglobin 11.0 (stable), MCV 82.5, sodium 134, glucose 103, creatinine 0.6, normal LFTs, lipase 582, UA with evidence of microscopic hematuria but not suggestive of infection.  CT abdomen pelvis showing acute pancreatitis with no peripancreatic collection.  Showing mild wall  thickening of the duodenum and transverse colon adjacent to pancreatic inflammation, likely reactive. Patient received IV morphine, Zofran, and 1 L normal saline bolus.  She was not hypoxic on arrival to the ED (satting 98-100%) but later desatted to 87% on room air after she was given IV morphine for pain and was subsequently placed on 2 L [place].  TR[facility] called to admit for acute recurrent pancreatitis.  Hospital course:  Acute recurrent idiopathic pancreatitis Patient presenting with progressive abdominal pain associate with nausea and vomiting.  History of pancreatitis in the past, last being 3 years prior, etiology unknown.  History of prior cholecystectomy.  Patient denies regular alcohol use.  Triglycerides have been normal on labs in the past including a month ago.  Mild leukocytosis.  No fever, tachycardia, or signs of sepsis.  Lipase 582, normal LFTs.  CT showing acute uncomplicated pancreatitis.  Patient was kept n.p.o. and started on aggressive IV fluid hydration with pain control.  Patient's diet was slowly advanced with toleration with lipase improving to 33 at time of discharge.  Continue bland diet, slowly advance as tolerates.  Outpatient follow-up with gastroenterology, Dr. Elnoria Howard 2-3 weeks.   Leukocytosis, likely reactive from acute pancreatitis WBC trended up to a high of 31.6 during hospitalization, improved to 16.4 at time of discharge. No focal infectious etiology noted, suspect reactive from acute pancreatitis, no necrosis or abscess/pseudocyst noted on imaging.  Procalcitonin 0.12 which was reassuring.  Recommend repeat CBC 1 week.   Mild hyponatremia:  Likely due to poor p.o. intake in the setting of acute pancreatitis as above.   Hypokalemia Repleted during hospitalization.   Suspect obstructive sleep apnea with nocturnal hypoxia She was not hypoxic on arrival to the  ED (satting 98-100%) but later desatted to 87% on room air after she was given IV morphine for pain in the ED.  She  is not endorsing any respiratory symptoms and lungs clear on exam.  Lower chest evaluated on CT abdomen pelvis showing minimal hypoventilatory change in the lung bases and no pleural effusion.   Recommend outpatient sleep study   COPD Stable, no signs of acute exacerbation.     GERD PPI   Hypothyroidism Continue levothyroxine 175 mcg p.o. daily    Chronic normocytic anemia Hemoglobin stable  Discharge Diagnoses:  Principal Problem:   Acute pancreatitis Active Problems:   Hyponatremia   Hypothyroidism   Hypoxia   COPD (chronic obstructive pulmonary disease) (HCC)   GERD (gastroesophageal reflux disease)    Discharge Instructions  Discharge Instructions     Call MD for:  difficulty breathing, headache or visual disturbances   Complete by: As directed    Call MD for:  extreme fatigue   Complete by: As directed    Call MD for:  persistant dizziness or light-headedness   Complete by: As directed    Call MD for:  persistant nausea and vomiting   Complete by: As directed    Call MD for:  severe uncontrolled pain   Complete by: As directed    Call MD for:  temperature >100.4   Complete by: As directed    Diet - low sodium heart healthy   Complete by: As directed    Increase activity slowly   Complete by: As directed       Allergies as of 07/29/2023       Reactions   Fish Allergy Hives   Gadolinium Derivatives Hives, Itching, Rash   Pt stated she felt itching and burning in her throat immediately following the injection.   Multihance [gadobenate] Hives, Itching, Rash   Itching and burning in her throat. Hives and itching of her skin.   Omnipaque [iohexol] Hives   Other Nausea And Vomiting   ALL NARCOTICS   Peanut-containing Drug Products Hives   Shellfish Allergy Hives   Codeine Nausea And Vomiting   Hydrocodone-acetaminophen Other (See Comments)   Oxycodone Nausea And Vomiting   Tramadol    headaches        Medication List     TAKE these medications     atorvastatin 10 MG tablet Commonly known as: Lipitor Take 1 tablet (10 mg total) by mouth daily.   diphenhydrAMINE 25 MG tablet Commonly known as: BENADRYL Take 25-50 mg by mouth daily as needed for itching (allergic reaction).   famotidine 10 MG tablet Commonly known as: PEPCID Take 10 mg by mouth daily.   gabapentin 100 MG capsule Commonly known as: Neurontin Take 1 capsule (100 mg total) by mouth at bedtime.   ibuprofen 200 MG tablet Commonly known as: ADVIL Take 400-800 mg by mouth every 6 (six) hours as needed for headache or moderate pain.   levocetirizine 5 MG tablet Commonly known as: XYZAL Take 5 mg by mouth at bedtime.   Polyethyl Glycol-Propyl Glycol 0.4-0.3 % Soln Place 1 drop into both eyes 5 (five) times daily as needed (dry eyes/irriation).   Synthroid 175 MCG tablet Generic drug: levothyroxine TAKE 1 TABLET BY MOUTH EVERY DAY   Vitamin D3 25 MCG (1000 UT) Caps Take 2 capsules by mouth daily. daily        Follow-up Information     Arnette Felts, FNP. Schedule an appointment as soon as possible for a visit in  1 week(s).   Specialty: General Practice Contact information: [address] [place] [address] [phone]         Jeani Hawking, MD. Schedule an appointment as soon as possible for a visit in 2 week(s).   Specialty: Gastroenterology Contact information: [address] [place] [address] [phone]                Allergies  Allergen Reactions   Fish Allergy Hives   Gadolinium Derivatives Hives, Itching and Rash    Pt stated she felt itching and burning in her throat immediately following the injection.   Multihance [Gadobenate] Hives, Itching and Rash    Itching and burning in her throat. Hives and itching of her skin.   Omnipaque [Iohexol] Hives   Other Nausea And Vomiting    ALL NARCOTICS   Peanut-Containing Drug Products Hives   Shellfish Allergy Hives   Codeine Nausea And  Vomiting   Hydrocodone-Acetaminophen Other (See Comments)   Oxycodone Nausea And Vomiting   Tramadol     headaches    Consultations: None   Procedures/Studies: CT ABDOMEN PELVIS WO CONTRAST Result Date: 07/25/2023 CLINICAL DATA:  Abdominal pain.  Pancreatitis suspected. EXAM: CT ABDOMEN AND PELVIS WITHOUT CONTRAST TECHNIQUE: Multidetector CT imaging of the abdomen and pelvis was performed following the standard protocol without IV contrast. RADIATION DOSE REDUCTION: This exam was performed according to the departmental dose-optimization program which includes automated exposure control, adjustment of the mA and/or kV according to patient size and/or use of iterative reconstruction technique. COMPARISON:  CT 07/30/2019 FINDINGS: Lower chest: Minimal hypoventilatory change in the lung bases. No pleural effusion. Hepatobiliary: Borderline hepatic steatosis. Subcentimeter hypodensity adjacent to the falciform ligament is unchanged from prior and likely small cyst. Stable cyst in the medial right lobe of the liver. Clips in the gallbladder fossa postcholecystectomy. No biliary dilatation. Pancreas: Moderate diffuse peripancreatic fat stranding and edema. No peripancreatic collection. No pancreatic air. Spleen: Unremarkable unenhanced appearance. Adrenals/Urinary Tract: No adrenal nodule. No hydronephrosis. Bilateral renal cysts. No further follow-up imaging is recommended. Punctate calcification in the upper right kidney may represent a parenchymal calcification or nonobstructing stone. Decompressed ureters. Unremarkable urinary bladder. Stomach/Bowel: Nondistended stomach. Mild duodenal wall thickening adjacent to pancreatic inflammation. Remainder of the small bowel is unremarkable and decompressed. No obstruction. There is also mild wall thickening of the transverse colon adjacent to pancreatic inflammation. Normal appendix. Colonic diverticulosis extending from the splenic flexure distally without  diverticulitis. Vascular/Lymphatic: Aortic atherosclerosis without aneurysm. Cannot assess for splenic vein patency in the absence of IV contrast. No bulky abdominopelvic adenopathy. Reproductive: Status post hysterectomy. No adnexal masses. Other: Peripancreatic fat stranding and edema with ill-defined fluid tracking into the anterior left pararenal space and pericolic gutter. No organized collection. No free air. Tiny fat containing umbilical hernia. Musculoskeletal: Moderate multilevel degenerative change in the lumbar spine. Modic endplate changes at L4-L5. There are no acute or suspicious osseous abnormalities. IMPRESSION: 1. Acute pancreatitis. No peripancreatic collection. 2. Mild wall thickening of the duodenum and transverse colon adjacent to pancreatic inflammation, likely reactive. 3. Colonic diverticulosis without diverticulitis. 4. Borderline hepatic steatosis. Aortic Atherosclerosis (ICD10-I70.0). Electronically Signed   By: Narda Rutherford M.D.   On: 07/25/2023 18:15     Subjective: Patient seen examined bedside, resting calmly.  Lying in bed.  Tolerating soft diet, abdominal pain has resolved.  States ready for discharge home.  Discussed with patient needs close follow-up with her gastroenterologist, Dr. Elnoria Howard over the next 2-3 weeks.  Also  discussed bland diet and slowly advance thereafter.  Also discussed that she had some very mild hypoxia down to 87% at nighttime while sleeping with concerns for possible underlying sleep apnea, recommend outpatient follow-up for sleep study.  No other specific questions, concerns or complaints at this time.  Denies headache, no dizziness, no chest pain, no palpitations, no shortness of breath, no abdominal pain, no fever/chills/night sweats, no nausea/vomiting/diarrhea, no focal weakness, no fatigue, no paresthesia.  No acute events overnight per nursing staff.  Discharge Exam: Vitals:   07/29/23 0618 07/29/23 0757  BP: 129/87 132/86  Pulse: 95 91   Resp: 18 16  Temp: 98 F (36.7 C) 98 F (36.7 C)  SpO2: (!) 87% 90%   Vitals:   07/28/23 1522 07/28/23 2106 07/29/23 0618 07/29/23 0757  BP: 132/79 123/86 129/87 132/86  Pulse: 92 89 95 91  Resp: 20 19 18 16   Temp: (!) 97.5 F (36.4 C) 98.2 F (36.8 C) 98 F (36.7 C) 98 F (36.7 C)  TempSrc: Oral Oral Oral Oral  SpO2: 98% 98% (!) 87% 90%  Height:        Physical Exam: GEN: NAD, alert and oriented x 3, wd/wn HEENT: NCAT, PERRL, EOMI, sclera clear, MMM PULM: CTAB w/o wheezes/crackles, normal respiratory effort, on room air CV: RRR w/o M/G/R GI: abd soft, NTND, NABS, no R/G/M MSK: no peripheral edema, muscle strength globally intact 5/5 bilateral upper/lower extremities NEURO: CN II-XII intact, no focal deficits, sensation to light touch intact PSYCH: normal mood/affect Integumentary: dry/intact, no rashes or wounds    The results of significant diagnostics from this hospitalization (including imaging, microbiology, ancillary and laboratory) are listed below for reference.     Microbiology: No results found for this or any previous visit (from the past 240 hours).   Labs: BNP (last 3 results) No results for input(s): "BNP" in the last 8760 hours. Basic Metabolic Panel: Recent Labs  Lab 07/25/23 1016 07/26/23 0450 07/27/23 0428 07/28/23 0535 07/29/23 0352  NA 134* 135 136 137 133*  K 3.8 3.4* 3.6 3.1* 3.6  CL 101 103 102 104 103  CO2 26 26 28 30 27   GLUCOSE 103* 139* 131* 109* 96  BUN 8 7 9 9 10   CREATININE 0.60 0.47 0.57 0.77 0.43*  CALCIUM 9.0 8.3* 8.6* 8.6* 8.2*  MG  --   --   --  1.8 1.8   Liver Function Tests: Recent Labs  Lab 07/25/23 1016 07/26/23 0450 07/27/23 0428 07/28/23 0535 07/29/23 0352  AST 16 15 14* 13* 12*  ALT 11 12 11 11 14   ALKPHOS 76 70 64 75 80  BILITOT 0.5 0.3 0.4 0.3 0.5  PROT 7.4 7.0 6.6 6.3* 6.2*  ALBUMIN 3.6 3.2* 2.9* 2.6* 2.4*   Recent Labs  Lab 07/25/23 1016 07/26/23 0450 07/27/23 0428 07/28/23 0535  07/29/23 0352  LIPASE 582* 568* 79* 40 33   No results for input(s): "AMMONIA" in the last 168 hours. CBC: Recent Labs  Lab 07/25/23 1016 07/26/23 0450 07/27/23 0428 07/28/23 0535 07/29/23 0352  WBC 12.2* 18.9* 31.6* 24.2* 16.4*  NEUTROABS  --   --   --  21.7*  --   HGB 11.0* 10.8* 10.4* 9.9* 9.0*  HCT 35.4* 34.3* 33.8* 31.7* 29.3*  MCV 82.5 83.1 84.9 83.6 84.4  PLT 284 280 265 268 263   Cardiac Enzymes: No results for input(s): "CKTOTAL", "CKMB", "CKMBINDEX", "TROPONINI" in the last 168 hours. BNP: Invalid input(s): "POCBNP" CBG: No results for input(s): "GLUCAP" in  the last 168 hours. D-Dimer No results for input(s): "DDIMER" in the last 72 hours. Hgb A1c No results for input(s): "HGBA1C" in the last 72 hours. Lipid Profile No results for input(s): "CHOL", "HDL", "LDLCALC", "TRIG", "CHOLHDL", "LDLDIRECT" in the last 72 hours. Thyroid function studies No results for input(s): "TSH", "T4TOTAL", "T3FREE", "THYROIDAB" in the last 72 hours.  Invalid input(s): "FREET3" Anemia work up No results for input(s): "VITAMINB12", "FOLATE", "FERRITIN", "TIBC", "IRON", "RETICCTPCT" in the last 72 hours. Urinalysis    Component Value Date/Time   COLORURINE YELLOW 07/25/2023 1041   APPEARANCEUR HAZY (A) 07/25/2023 1041   LABSPEC 1.023 07/25/2023 1041   PHURINE 6.0 07/25/2023 1041   GLUCOSEU NEGATIVE 07/25/2023 1041   HGBUR LARGE (A) 07/25/2023 1041   BILIRUBINUR NEGATIVE 07/25/2023 1041   BILIRUBINUR small 11/28/2018 1610   KETONESUR NEGATIVE 07/25/2023 1041   PROTEINUR 100 (A) 07/25/2023 1041   UROBILINOGEN 0.2 11/28/2018 1610   UROBILINOGEN 0.2 03/16/2015 1648   NITRITE NEGATIVE 07/25/2023 1041   LEUKOCYTESUR NEGATIVE 07/25/2023 1041   Sepsis Labs Recent Labs  Lab 07/26/23 0450 07/27/23 0428 07/28/23 0535 07/29/23 0352  WBC 18.9* 31.6* 24.2* 16.4*   Microbiology No results found for this or any previous visit (from the past 240 hours).   Time coordinating  discharge: Over 30 minutes  SIGNED:   Alvira Philips [place], DO  [facility] 07/29/2023, 11:47 AM

## 2023-07-31 ENCOUNTER — Telehealth: Payer: Self-pay

## 2023-07-31 DIAGNOSIS — M5432 Sciatica, left side: Secondary | ICD-10-CM | POA: Diagnosis not present

## 2023-07-31 DIAGNOSIS — Z72820 Sleep deprivation: Secondary | ICD-10-CM | POA: Diagnosis not present

## 2023-07-31 DIAGNOSIS — M9905 Segmental and somatic dysfunction of pelvic region: Secondary | ICD-10-CM | POA: Diagnosis not present

## 2023-07-31 DIAGNOSIS — M9903 Segmental and somatic dysfunction of lumbar region: Secondary | ICD-10-CM | POA: Diagnosis not present

## 2023-07-31 NOTE — Transitions of Care (Post Inpatient/ED Visit) (Signed)
07/31/2023  Name: Dana Martin MRN: [id number] DOB: [birth year]  Today's TOC FU Call Status: Today's TOC FU Call Status:: Successful TOC FU Call Completed TOC FU Call Complete Date: 07/31/23 Patient's Name and Date of Birth confirmed.  Transition Care Management Follow-up Telephone Call Date of Discharge: 07/29/23 (Pt showed on TOC d/c report on 07/31/23 via manual add- outreached accordingly) Discharge Facility: [facility]) Type of Discharge: Inpatient Admission Primary Inpatient Discharge Diagnosis:: 'alcohol induced acute pancreatitis" How have you been since you were released from the hospital?: Better (Pt states she is doing better except for her sciatica nerve pain-that is chronic-saw chiropractor today & took some Advil-out Christmas shopping right now-appetite good-denies any N&V, LBM yest) Any questions or concerns?: No  Items Reviewed: Did you receive and understand the discharge instructions provided?: Yes Medications obtained,verified, and reconciled?: Yes (Medications Reviewed) Any new allergies since your discharge?: No Dietary orders reviewed?: Yes Type of Diet Ordered:: low salt/heart healthy Do you have support at home?: Yes People in Home: spouse Name of Support/Comfort Primary Source: Harvie Heck  Medications Reviewed Today: Medications Reviewed Today     Reviewed by Charlyn Minerva, RN (Registered Nurse) on 07/31/23 at 1308  Med List Status: <None>   Medication Order Taking? Sig Documenting Provider Last Dose Status Informant  atorvastatin (LIPITOR) 10 MG tablet [id number] Yes Take 1 tablet (10 mg total) by mouth daily. Arnette Felts, FNP 07/30/2023 Active Self, Pharmacy Records           Med Note [facility] Ronnald Nian   Tue Jul 25, 2023  5:35 PM) Patient adamant of taking this medication. LF: 01/15/2023 for 30 DS  Cholecalciferol (VITAMIN D3) 25 MCG (1000 UT) CAPS [id number] Yes Take 2 capsules by mouth daily. daily [provider]  07/31/2023 Active Self, Pharmacy Records           Med Note [facility] Alinda Dooms A   Tue Jul 25, 2023  5:36 PM) OTC  diphenhydrAMINE (BENADRYL) 25 MG tablet [id number] Yes Take 25-50 mg by mouth daily as needed for itching (allergic reaction).  [provider] Taking Active Self, Pharmacy Records  famotidine (PEPCID) 10 MG tablet [id number] Yes Take 10 mg by mouth daily. [provider] 07/31/2023 Active Self, Pharmacy Records  gabapentin (NEURONTIN) 100 MG capsule [id number]  Take 1 capsule (100 mg total) by mouth at bedtime. Arnette Felts, FNP  Expired 07/25/23 2359 Self, Pharmacy Records           Med Note [facility] Alinda Dooms A   Tue Jul 25, 2023  5:34 PM) Pt states that she has this medication at home, but has never taken any.   ibuprofen (ADVIL) 200 MG tablet [id number] Yes Take 400-800 mg by mouth every 6 (six) hours as needed for headache or moderate pain. [provider] 07/31/2023 Active Self, Pharmacy Records  levocetirizine (XYZAL) 5 MG tablet [id number] Yes Take 5 mg by mouth at bedtime. [provider] 07/30/2023 Active Self, Pharmacy Records  Polyethyl Glycol-Propyl Glycol 0.4-0.3 % SOLN [id number] Yes Place 1 drop into both eyes 5 (five) times daily as needed (dry eyes/irriation). [provider] Taking Active Self, Pharmacy Records  SYNTHROID 175 MCG tablet [id number] Yes TAKE 1 TABLET BY MOUTH EVERY DAY Arnette Felts, FNP 07/31/2023 Active Self, Pharmacy Records            Home Care and Equipment/Supplies: Were Home Health Services Ordered?: NA Any new equipment or medical supplies ordered?: NA  Functional Questionnaire: Do you need assistance  with bathing/showering or dressing?: No Do you need assistance with meal preparation?: No Do you need assistance with eating?: No Do you have difficulty maintaining continence: No Do you need assistance with getting out of bed/getting out of a chair/moving?: No Do you have difficulty  managing or taking your medications?: No  Follow up appointments reviewed: PCP Follow-up appointment confirmed?: No (pt voices she has already called PCP office this morning to request appt-provider has no openings for next several wks and office is to call her back after discussing with provider) MD Provider Line Number:[phone] Given: No Specialist Hospital Follow-up appointment confirmed?: No Reason Specialist Follow-Up Not Confirmed: Patient has Specialist Provider Number and will Call for Appointment (pt aware to call GI office to make follow up appt, pt also aware of need for sleep study to evaluate for OSA and will discuss wth PCP during appt) Do you need transportation to your follow-up appointment?: No Do you understand care options if your condition(s) worsen?: Yes-patient verbalized understanding  SDOH Interventions Today    Flowsheet Row Most Recent Value  SDOH Interventions   Food Insecurity Interventions Intervention Not Indicated  Housing Interventions Intervention Not Indicated  Transportation Interventions Intervention Not Indicated  Utilities Interventions Intervention Not Indicated       Interventions Today    Flowsheet Row Most Recent Value  General Interventions   General Interventions Discussed/Reviewed General Interventions Discussed, Doctor Visits  Doctor Visits Discussed/Reviewed Doctor Visits Discussed, PCP, Specialist  PCP/Specialist Visits Compliance with follow-up visit  Education Interventions   Education Provided Provided Education  Provided Verbal Education On Nutrition, When to see the doctor, Medication, Other  Nutrition Interventions   Nutrition Discussed/Reviewed Nutrition Discussed  Pharmacy Interventions   Pharmacy Dicussed/Reviewed Pharmacy Topics Discussed, Medications and their functions       TOC Interventions Today    Flowsheet Row Most Recent Value  TOC Interventions   TOC Interventions Discussed/Reviewed TOC Interventions  Discussed       Antionette Fairy, RN,BSN,CCM RN Care Manager Transitions of Care  [facility]  Direct Phone: [phone] Toll Free: [phone] Fax: [phone]

## 2023-08-04 DIAGNOSIS — M9903 Segmental and somatic dysfunction of lumbar region: Secondary | ICD-10-CM | POA: Diagnosis not present

## 2023-08-04 DIAGNOSIS — M9905 Segmental and somatic dysfunction of pelvic region: Secondary | ICD-10-CM | POA: Diagnosis not present

## 2023-08-04 DIAGNOSIS — M5432 Sciatica, left side: Secondary | ICD-10-CM | POA: Diagnosis not present

## 2023-08-04 DIAGNOSIS — Z72820 Sleep deprivation: Secondary | ICD-10-CM | POA: Diagnosis not present

## 2023-08-07 ENCOUNTER — Encounter: Payer: Self-pay | Admitting: Nurse Practitioner

## 2023-08-07 ENCOUNTER — Ambulatory Visit: Payer: BC Managed Care – PPO | Admitting: Nurse Practitioner

## 2023-08-07 VITALS — BP 120/76 | HR 84 | Temp 98.3°F | Ht 65.8 in | Wt 193.4 lb

## 2023-08-07 DIAGNOSIS — R0683 Snoring: Secondary | ICD-10-CM

## 2023-08-07 DIAGNOSIS — R002 Palpitations: Secondary | ICD-10-CM

## 2023-08-07 DIAGNOSIS — K859 Acute pancreatitis without necrosis or infection, unspecified: Secondary | ICD-10-CM

## 2023-08-07 DIAGNOSIS — R5383 Other fatigue: Secondary | ICD-10-CM

## 2023-08-07 DIAGNOSIS — Z6831 Body mass index (BMI) 31.0-31.9, adult: Secondary | ICD-10-CM

## 2023-08-07 DIAGNOSIS — Z72820 Sleep deprivation: Secondary | ICD-10-CM | POA: Diagnosis not present

## 2023-08-07 DIAGNOSIS — M5432 Sciatica, left side: Secondary | ICD-10-CM | POA: Diagnosis not present

## 2023-08-07 DIAGNOSIS — M5442 Lumbago with sciatica, left side: Secondary | ICD-10-CM

## 2023-08-07 DIAGNOSIS — E6609 Other obesity due to excess calories: Secondary | ICD-10-CM

## 2023-08-07 DIAGNOSIS — E66811 Obesity, class 1: Secondary | ICD-10-CM

## 2023-08-07 DIAGNOSIS — M9903 Segmental and somatic dysfunction of lumbar region: Secondary | ICD-10-CM | POA: Diagnosis not present

## 2023-08-07 DIAGNOSIS — M9905 Segmental and somatic dysfunction of pelvic region: Secondary | ICD-10-CM | POA: Diagnosis not present

## 2023-08-07 LAB — CBC
Hematocrit: 35.9 % (ref 34.0–46.6)
Hemoglobin: 11 g/dL — ABNORMAL LOW (ref 11.1–15.9)
MCH: 25.3 pg — ABNORMAL LOW (ref 26.6–33.0)
MCHC: 30.6 g/dL — ABNORMAL LOW (ref 31.5–35.7)
MCV: 83 fL (ref 79–97)
Platelets: 416 10*3/uL (ref 150–450)
RBC: 4.35 x10E6/uL (ref 3.77–5.28)
RDW: 16.3 % — ABNORMAL HIGH (ref 11.7–15.4)
WBC: 8.9 10*3/uL (ref 3.4–10.8)

## 2023-08-07 MED ORDER — PREDNISONE 10 MG (21) PO TBPK: ORAL_TABLET | ORAL | 0 refills | Status: DC

## 2023-08-08 DIAGNOSIS — R002 Palpitations: Secondary | ICD-10-CM | POA: Insufficient documentation

## 2023-08-08 DIAGNOSIS — M5442 Lumbago with sciatica, left side: Secondary | ICD-10-CM | POA: Insufficient documentation

## 2023-08-08 DIAGNOSIS — E66811 Other obesity due to excess calories: Secondary | ICD-10-CM | POA: Insufficient documentation

## 2023-08-08 DIAGNOSIS — R5383 Other fatigue: Secondary | ICD-10-CM | POA: Insufficient documentation

## 2023-08-08 DIAGNOSIS — R0683 Snoring: Secondary | ICD-10-CM | POA: Insufficient documentation

## 2023-08-08 HISTORY — DX: Other fatigue: R53.83

## 2023-08-08 HISTORY — DX: Lumbago with sciatica, left side: M54.42

## 2023-08-08 NOTE — Assessment & Plan Note (Signed)
This is likely related to her recent hospitalization.

## 2023-08-08 NOTE — Assessment & Plan Note (Signed)
Will order sleep study, she was found to have snoring and possible sleep apnea while hospitalized.

## 2023-08-08 NOTE — Assessment & Plan Note (Signed)
 She is encouraged to strive for BMI less than 30 to decrease cardiac risk. Advised to aim for at least 150 minutes of exercise per week.

## 2023-08-08 NOTE — Assessment & Plan Note (Signed)
 TCM Performed. A member of the clinical team spoke with the patient upon dischare. Discharge summary was reviewed in full detail during the visit. Meds reconciled and compared to discharge meds. Medication list is updated and reviewed with the patient.  Greater than 50% face to face time was spent in counseling an coordination of care.  All questions were answered to the satisfaction of the patient.

## 2023-08-08 NOTE — Assessment & Plan Note (Signed)
Pain to right radiating to her right leg negative radiculopathy.  Will treat for sciatic nerve pain with a steroid Dosepak.

## 2023-08-08 NOTE — Assessment & Plan Note (Signed)
Will order a sleep study as if she has sleep apnea this can cause heart palpitations.

## 2023-08-11 DIAGNOSIS — M5432 Sciatica, left side: Secondary | ICD-10-CM | POA: Diagnosis not present

## 2023-08-11 DIAGNOSIS — M9903 Segmental and somatic dysfunction of lumbar region: Secondary | ICD-10-CM | POA: Diagnosis not present

## 2023-08-11 DIAGNOSIS — Z72820 Sleep deprivation: Secondary | ICD-10-CM | POA: Diagnosis not present

## 2023-08-11 DIAGNOSIS — M9905 Segmental and somatic dysfunction of pelvic region: Secondary | ICD-10-CM | POA: Diagnosis not present

## 2023-08-14 DIAGNOSIS — M9903 Segmental and somatic dysfunction of lumbar region: Secondary | ICD-10-CM | POA: Diagnosis not present

## 2023-08-14 DIAGNOSIS — Z72820 Sleep deprivation: Secondary | ICD-10-CM | POA: Diagnosis not present

## 2023-08-14 DIAGNOSIS — M9905 Segmental and somatic dysfunction of pelvic region: Secondary | ICD-10-CM | POA: Diagnosis not present

## 2023-08-14 DIAGNOSIS — M5432 Sciatica, left side: Secondary | ICD-10-CM | POA: Diagnosis not present

## 2023-08-16 DIAGNOSIS — K8500 Idiopathic acute pancreatitis without necrosis or infection: Secondary | ICD-10-CM | POA: Diagnosis not present

## 2023-08-16 DIAGNOSIS — R1013 Epigastric pain: Secondary | ICD-10-CM | POA: Diagnosis not present

## 2023-08-17 ENCOUNTER — Ambulatory Visit
Admission: RE | Admit: 2023-08-17 | Discharge: 2023-08-17 | Disposition: A | Discharge status: Home / Self Care | Payer: BC Managed Care – PPO | Source: Ambulatory Visit | Attending: Chiropractor | Admitting: Chiropractor

## 2023-08-17 ENCOUNTER — Other Ambulatory Visit: Payer: Self-pay | Admitting: Chiropractor

## 2023-08-17 ENCOUNTER — Ambulatory Visit
Admission: RE | Admit: 2023-08-17 | Discharge: 2023-08-17 | Disposition: A | Discharge status: Home / Self Care | Payer: BC Managed Care – PPO | Attending: Chiropractor | Admitting: Chiropractor

## 2023-08-17 DIAGNOSIS — M5431 Sciatica, right side: Secondary | ICD-10-CM | POA: Insufficient documentation

## 2023-08-17 DIAGNOSIS — M5432 Sciatica, left side: Secondary | ICD-10-CM | POA: Insufficient documentation

## 2023-08-17 DIAGNOSIS — M5430 Sciatica, unspecified side: Secondary | ICD-10-CM | POA: Diagnosis not present

## 2023-08-17 DIAGNOSIS — M47816 Spondylosis without myelopathy or radiculopathy, lumbar region: Secondary | ICD-10-CM | POA: Diagnosis not present

## 2023-08-17 DIAGNOSIS — M1612 Unilateral primary osteoarthritis, left hip: Secondary | ICD-10-CM | POA: Diagnosis not present

## 2023-08-18 DIAGNOSIS — M9905 Segmental and somatic dysfunction of pelvic region: Secondary | ICD-10-CM | POA: Diagnosis not present

## 2023-08-18 DIAGNOSIS — M5432 Sciatica, left side: Secondary | ICD-10-CM | POA: Diagnosis not present

## 2023-08-18 DIAGNOSIS — Z72820 Sleep deprivation: Secondary | ICD-10-CM | POA: Diagnosis not present

## 2023-08-18 DIAGNOSIS — M9903 Segmental and somatic dysfunction of lumbar region: Secondary | ICD-10-CM | POA: Diagnosis not present

## 2023-08-21 DIAGNOSIS — Z72820 Sleep deprivation: Secondary | ICD-10-CM | POA: Diagnosis not present

## 2023-08-21 DIAGNOSIS — M9903 Segmental and somatic dysfunction of lumbar region: Secondary | ICD-10-CM | POA: Diagnosis not present

## 2023-08-21 DIAGNOSIS — M5432 Sciatica, left side: Secondary | ICD-10-CM | POA: Diagnosis not present

## 2023-08-21 DIAGNOSIS — M9905 Segmental and somatic dysfunction of pelvic region: Secondary | ICD-10-CM | POA: Diagnosis not present

## 2023-08-25 DIAGNOSIS — M9903 Segmental and somatic dysfunction of lumbar region: Secondary | ICD-10-CM | POA: Diagnosis not present

## 2023-08-25 DIAGNOSIS — M9905 Segmental and somatic dysfunction of pelvic region: Secondary | ICD-10-CM | POA: Diagnosis not present

## 2023-08-25 DIAGNOSIS — M5432 Sciatica, left side: Secondary | ICD-10-CM | POA: Diagnosis not present

## 2023-08-25 DIAGNOSIS — Z72820 Sleep deprivation: Secondary | ICD-10-CM | POA: Diagnosis not present

## 2023-08-28 DIAGNOSIS — M9905 Segmental and somatic dysfunction of pelvic region: Secondary | ICD-10-CM | POA: Diagnosis not present

## 2023-08-28 DIAGNOSIS — M9903 Segmental and somatic dysfunction of lumbar region: Secondary | ICD-10-CM | POA: Diagnosis not present

## 2023-08-28 DIAGNOSIS — Z72820 Sleep deprivation: Secondary | ICD-10-CM | POA: Diagnosis not present

## 2023-08-28 DIAGNOSIS — M5432 Sciatica, left side: Secondary | ICD-10-CM | POA: Diagnosis not present

## 2023-09-01 DIAGNOSIS — M9903 Segmental and somatic dysfunction of lumbar region: Secondary | ICD-10-CM | POA: Diagnosis not present

## 2023-09-01 DIAGNOSIS — Z72820 Sleep deprivation: Secondary | ICD-10-CM | POA: Diagnosis not present

## 2023-09-01 DIAGNOSIS — M5432 Sciatica, left side: Secondary | ICD-10-CM | POA: Diagnosis not present

## 2023-09-01 DIAGNOSIS — M9905 Segmental and somatic dysfunction of pelvic region: Secondary | ICD-10-CM | POA: Diagnosis not present

## 2023-09-06 ENCOUNTER — Ambulatory Visit (INDEPENDENT_AMBULATORY_CARE_PROVIDER_SITE_OTHER): Payer: BC Managed Care – PPO | Admitting: Neurology

## 2023-09-06 ENCOUNTER — Encounter: Payer: Self-pay | Admitting: Neurology

## 2023-09-06 VITALS — BP 130/98 | HR 91 | Ht 65.0 in | Wt 198.2 lb

## 2023-09-06 DIAGNOSIS — R519 Headache, unspecified: Secondary | ICD-10-CM

## 2023-09-06 DIAGNOSIS — G4734 Idiopathic sleep related nonobstructive alveolar hypoventilation: Secondary | ICD-10-CM | POA: Diagnosis not present

## 2023-09-06 DIAGNOSIS — F17200 Nicotine dependence, unspecified, uncomplicated: Secondary | ICD-10-CM

## 2023-09-06 DIAGNOSIS — Z9189 Other specified personal risk factors, not elsewhere classified: Secondary | ICD-10-CM | POA: Diagnosis not present

## 2023-09-06 DIAGNOSIS — G4719 Other hypersomnia: Secondary | ICD-10-CM | POA: Diagnosis not present

## 2023-09-06 DIAGNOSIS — Z789 Other specified health status: Secondary | ICD-10-CM

## 2023-09-06 DIAGNOSIS — I493 Ventricular premature depolarization: Secondary | ICD-10-CM

## 2023-09-06 DIAGNOSIS — R0683 Snoring: Secondary | ICD-10-CM

## 2023-09-06 DIAGNOSIS — IMO0001 Reserved for inherently not codable concepts without codable children: Secondary | ICD-10-CM

## 2023-09-06 NOTE — Patient Instructions (Signed)
Thank you for choosing [facility] for your sleep related care! It was nice to meet you today!   Here is what we discussed today:    Based on your symptoms and your exam I believe you are at risk for obstructive sleep apnea (aka OSA). We should proceed with a sleep study to determine whether you do or do not have OSA and how severe it is. Even, if you have mild OSA, I may want you to consider treatment with CPAP, as treatment of even borderline or mild sleep apnea can result and improvement of symptoms such as sleep disruption, daytime sleepiness, nighttime bathroom breaks, restless leg symptoms, improvement of headache syndromes, even improved mood disorder.   As explained, an attended sleep study (meaning you get to stay overnight in the sleep lab), lets [place] monitor sleep-related behaviors such as sleep talking and leg movements in sleep, in addition to monitoring for sleep apnea.  A home sleep test is a screening tool for sleep apnea diagnosis only, but unfortunately, does not help with any other sleep-related diagnoses.  Please remember, the long-term risks and ramifications of untreated moderate to severe obstructive sleep apnea may include (but are not limited to): increased risk for cardiovascular disease, including congestive heart failure, stroke, difficult to control hypertension, treatment resistant obesity, arrhythmias, especially irregular heartbeat commonly known as A. Fib. (atrial fibrillation); even type 2 diabetes has been linked to untreated OSA.   Other correlations that untreated obstructive sleep apnea include macular edema which is swelling of the retina in the eyes, droopy eyelid syndrome, and elevated hemoglobin and hematocrit levels (often referred to as polycythemia).  Sleep apnea can cause disruption of sleep and sleep deprivation in most cases, which, in turn, can cause recurrent headaches, problems with memory, mood, concentration, focus, and vigilance. Most  people with untreated sleep apnea report excessive daytime sleepiness, which can affect their ability to drive. Please do not drive or use heavy equipment or machinery, if you feel sleepy! Patients with sleep apnea can also develop difficulty initiating and maintaining sleep (aka insomnia).   Having sleep apnea may increase your risk for other sleep disorders, including involuntary behaviors sleep such as sleep terrors, sleep talking, sleepwalking.    Having sleep apnea can also increase your risk for restless leg syndrome and leg movements at night.   Please note that untreated obstructive sleep apnea may carry additional perioperative morbidity. Patients with significant obstructive sleep apnea (typically, in the moderate to severe degree) should receive, if possible, perioperative PAP (positive airway pressure) therapy and the surgeons and particularly the anesthesiologists should be informed of the diagnosis and the severity of the sleep disordered breathing.   We will call you or email you through MyChart with regards to your test results and plan a follow-up in sleep clinic accordingly. Most likely, you will hear from one of our nurses.   Our sleep lab administrative assistant will call you to schedule your sleep study and give you further instructions, regarding the check in process for the sleep study, arrival time, what to bring, when you can expect to leave after the study, etc., and to answer any other logistical questions you may have. If you don't hear back from her by about 2 weeks from now, please feel free to call her direct line at [phone] or you can call our general clinic number, or email [place] through My Chart.

## 2023-09-06 NOTE — Progress Notes (Signed)
Subjective:    Patient ID: Dana Martin is a 58 y.o. female.  HPI    Huston Foley, MD, PhD [facility] [address] [address] [place], [place] [address]  Dear Lolita Cram,    I saw your patient, Dana Martin, upon your kind request in my sleep clinic today for initial consultation of her sleep disorder, in particular, concern for underlying obstructive sleep apnea.  The patient is unaccompanied today.  As you know, Dana Martin is a 58 year old female with an underlying medical history of rec. pancreatitis, smoking, COPD, hearing loss, reflux disease, hypothyroidism, PVCs, obesity, who reports snoring and excessive daytime somnolence, difficulty falling asleep at night and nonrestorative sleep.  Her Epworth sleepiness score is 6 out of 24, fatigue severity score is 45 out of 63.  She reports oxygen desaturations while asleep during her recent hospitalization.  She was hospitalized in December for pancreatitis.  She drinks alcohol occasionally, none since her last bout of pancreatitis.  She smokes about half a pack per day and is working on smoking cessation.  She drinks quite a bit of caffeine in the form of coffee, 3 to 4 cups/day, as late as 5 PM and half a serving to 1 serving of soda per day.  She works in Engineering geologist with variable hours.  Bedtime is generally between 11 and 1 AM and rise time between 7 and 7:30 AM.  She does have a TV on in her bedroom and it tends to stay on all night.  She has occasional morning headaches, does not typically take any medication for this.  Her sister has sleep apnea.  Patient lives with her husband, they have a small dog in the household, the dog typically sleeps on the bed with them.    Her Past Medical History Is Significant For: Past Medical History:  Diagnosis Date   Acute pancreatitis 12/06/2013   Adverse food reaction 09/04/2019   Angio-edema    COPD (chronic obstructive pulmonary disease) (HCC)    denies SOB with daily  activities   Epistaxis, recurrent 10/07/2022   Full dentures    GERD (gastroesophageal reflux disease)    History of pancreatitis    Hypothyroidism    Nasal sinus congestion 05/08/2014   with sinus drainage   Pancreatitis    PVC (premature ventricular contraction) 07/07/2017   Stress incontinence    Symptomatic cholelithiasis 05/2014   Urticaria    Varicose veins of bilateral lower extremities with pain    Wears dentures    upper and lower   Wears glasses     Her Past Surgical History Is Significant For: Past Surgical History:  Procedure Laterality Date   BREAST BIOPSY Right pt unsure   benign   BREAST EXCISIONAL BIOPSY Left pt unsure   benign   BREAST LUMPECTOMY     CHOLECYSTECTOMY N/A 05/13/2014   Procedure: LAPAROSCOPIC CHOLECYSTECTOMY WITH INTRAOPERATIVE CHOLANGIOGRAM;  Surgeon: Abigail Miyamoto, MD;  Location:  SURGERY CENTER;  Service: General;  Laterality: N/A;   ESOPHAGOGASTRODUODENOSCOPY (EGD) WITH PROPOFOL N/A 08/23/2019   Procedure: ESOPHAGOGASTRODUODENOSCOPY (EGD) WITH PROPOFOL;  Surgeon: Jeani Hawking, MD;  Location: [facility];  Service: Endoscopy;  Laterality: N/A;   EUS N/A 02/19/2014   Procedure: ESOPHAGEAL ENDOSCOPIC ULTRASOUND (EUS) RADIAL;  Surgeon: Willis Modena, MD;  Location: [facility];  Service: Endoscopy;  Laterality: N/A;   TUBAL LIGATION     UPPER ESOPHAGEAL ENDOSCOPIC ULTRASOUND (EUS) N/A 08/23/2019   Procedure: UPPER ESOPHAGEAL ENDOSCOPIC ULTRASOUND (EUS);  Surgeon: Elnoria Howard,  Luisa Hart, MD;  Location: [facility];  Service: Endoscopy;  Laterality: N/A;   VAGINAL HYSTERECTOMY  11/13/2001   transvaginal     Her Family History Is Significant For: Family History  Problem Relation Age of Onset   Hypertension Mother    Atrial fibrillation Mother    Heart attack Mother    Hypertension Sister    Hypertension Brother    Hypertension Daughter    Lung cancer Brother    Diabetes Sister     Her Social History Is Significant For: Social  History   Socioeconomic History   Marital status: Married    Spouse name: Not on file   Number of children: Not on file   Years of education: Not on file   Highest education level: 12th grade  Occupational History   Not on file  Tobacco Use   Smoking status: Every Day    Current packs/day: 1.00    Average packs/day: 1 pack/day for 25.0 years (25.0 ttl pk-yrs)    Types: Cigarettes   Smokeless tobacco: Never   Tobacco comments:    quit for six years, started back 09-2018 - currently on wellbutrin for smoking cessation. 12-09-21 smoking 1/2 pack/day, 06/13/22 - down to 5-6 cigs/day, using patches 06/21/2023, down to 2 cigarettes/day  Substance and Sexual Activity   Alcohol use: Yes   Drug use: No   Sexual activity: Yes  Other Topics Concern   Not on file  Social History Narrative   Not on file   Social Drivers of Health   Financial Resource Strain: Low Risk  (06/21/2023)   Overall Financial Resource Strain (CARDIA)    Difficulty of Paying Living Expenses: Not very hard  Food Insecurity: No Food Insecurity (07/31/2023)   Hunger Vital Sign    Worried About Running Out of Food in the Last Year: Never true    Ran Out of Food in the Last Year: Never true  Transportation Needs: No Transportation Needs (07/31/2023)   PRAPARE - Administrator, Civil Service (Medical): No    Lack of Transportation (Non-Medical): No  Physical Activity: Unknown (06/21/2023)   Exercise Vital Sign    Days of Exercise per Week: Patient declined    Minutes of Exercise per Session: Not on file  Stress: Stress Concern Present (06/21/2023)   [employer] of Occupational Health - Occupational Stress Questionnaire    Feeling of Stress : To some extent  Social Connections: Unknown (06/21/2023)   Social Connection and Isolation Panel [NHANES]    Frequency of Communication with Friends and Family: Once a week    Frequency of Social Gatherings with Friends and Family: Not on file    Attends  Religious Services: Patient declined    Active Member of Clubs or Organizations: No    Attends Engineer, structural: Not on file    Marital Status: Married    Her Allergies Are:  Allergies  Allergen Reactions   Fish Allergy Hives   Gadolinium Derivatives Hives, Itching and Rash    Pt stated she felt itching and burning in her throat immediately following the injection.   Multihance [Gadobenate] Hives, Itching and Rash    Itching and burning in her throat. Hives and itching of her skin.   Omnipaque [Iohexol] Hives   Other Nausea And Vomiting    ALL NARCOTICS   Peanut-Containing Drug Products Hives   Shellfish Allergy Hives   Codeine Nausea And Vomiting   Hydrocodone-Acetaminophen Other (See Comments)   Oxycodone Nausea And Vomiting  Tramadol     headaches  :   Her Current Medications Are:  Outpatient Encounter Medications as of 09/06/2023  Medication Sig   atorvastatin (LIPITOR) 10 MG tablet Take 1 tablet (10 mg total) by mouth daily.   Cholecalciferol (VITAMIN D3) 25 MCG (1000 UT) CAPS Take 2 capsules by mouth daily. daily   diphenhydrAMINE (BENADRYL) 25 MG tablet Take 25-50 mg by mouth daily as needed for itching (allergic reaction).    famotidine (PEPCID) 10 MG tablet Take 10 mg by mouth daily.   ibuprofen (ADVIL) 200 MG tablet Take 400-800 mg by mouth every 6 (six) hours as needed for headache or moderate pain.   levocetirizine (XYZAL) 5 MG tablet Take 5 mg by mouth at bedtime.   Polyethyl Glycol-Propyl Glycol 0.4-0.3 % SOLN Place 1 drop into both eyes 5 (five) times daily as needed (dry eyes/irriation).   predniSONE (STERAPRED UNI-PAK 21 TAB) 10 MG (21) TBPK tablet Take as directed   SYNTHROID 175 MCG tablet TAKE 1 TABLET BY MOUTH EVERY DAY   gabapentin (NEURONTIN) 100 MG capsule Take 1 capsule (100 mg total) by mouth at bedtime.   No facility-administered encounter medications on file as of 09/06/2023.  :   Review of Systems:  Out of a complete 14 point  review of systems, all are reviewed and negative with the exception of these symptoms as listed below:   Review of Systems  Neurological:        Patient in room #9 and alone. Patient states she been felling fatigue for a long time and was told her O2 level drops at night.    Objective:  Neurological Exam  Physical Exam Physical Examination:   Vitals:   09/06/23 1111  BP: (!) 130/98  Pulse: 91    General Examination: The patient is a very pleasant 58 y.o. female in no acute distress. She appears well-developed and well-nourished and well groomed.   HEENT: Normocephalic, atraumatic, pupils are equal, round and reactive to light, extraocular tracking is good without limitation to gaze excursion or nystagmus noted. Hearing is grossly intact. Face is symmetric with normal facial animation. Speech is clear with no dysarthria noted. There is no hypophonia. There is no lip, neck/head, jaw or voice tremor. Neck is supple with full range of passive and active motion. There are no carotid bruits on auscultation. Oropharynx exam reveals: moderate mouth dryness, adequate dental hygiene with full dentures in place, significant airway crowding noted primarily because of small airway and Mallampati class IV.  Uvula and tonsils not fully visualized.  Tongue protrudes centrally.  Neck circumference 14-1/4 inches.   Chest: Clear to auscultation without wheezing, rhonchi or crackles noted.  Heart: S1+S2+0, regular and normal without murmurs, rubs or gallops noted.   Abdomen: Soft, non-tender and non-distended.  Extremities: There is no pitting edema in the distal lower extremities bilaterally.   Skin: Warm and dry without trophic changes noted.   Musculoskeletal: exam reveals no obvious joint deformities.   Neurologically:  Mental status: The patient is awake, alert and oriented in all 4 spheres. Her immediate and remote memory, attention, language skills and fund of knowledge are appropriate. There  is no evidence of aphasia, agnosia, apraxia or anomia. Speech is clear with normal prosody and enunciation. Thought process is linear. Mood is normal and affect is normal.  Cranial nerves II - XII are as described above under HEENT exam.  Motor exam: Normal bulk, strength and tone is noted. There is no obvious action or resting tremor.  Fine motor skills and coordination: grossly intact.  Cerebellar testing: No dysmetria or intention tremor. There is no truncal or gait ataxia.  Sensory exam: intact to light touch in the upper and lower extremities.  Gait, station and balance: She stands easily. No veering to one side is noted. No leaning to one side is noted. Posture is age-appropriate and stance is narrow based. Gait shows normal stride length and normal pace. No problems turning are noted.   Assessment and Plan:  In summary, Dana Martin is a very pleasant 58 y.o.-year old female with an underlying medical history of recurrent pancreatitis, smoking, COPD, hearing loss, reflux disease, hypothyroidism, PVCs, obesity, whose history and physical exam are concerning for sleep disordered breathing, particularly obstructive sleep apnea (OSA).  While a laboratory attended sleep study is typically considered "gold standard" for evaluation of sleep disordered breathing, we mutually agreed to proceed with a home sleep test at this time, particularly because of her variable work and sleep schedule.   I had a long chat with the patient about my findings and the diagnosis of sleep apnea, particularly OSA, its prognosis and treatment options. We talked about medical/conservative treatments, surgical interventions and non-pharmacological approaches for symptom control. I explained, in particular, the risks and ramifications of untreated moderate to severe OSA, especially with respect to developing cardiovascular disease down the road, including congestive heart failure (CHF), difficult to treat hypertension, cardiac  arrhythmias (particularly A-fib), neurovascular complications including TIA, stroke and dementia. Even type 2 diabetes has, in part, been linked to untreated OSA. Symptoms of untreated OSA may include (but may not be limited to) daytime sleepiness, nocturia (i.e. frequent nighttime urination), memory problems, mood irritability and suboptimally controlled or worsening mood disorder such as depression and/or anxiety, lack of energy, lack of motivation, physical discomfort, as well as recurrent headaches, especially morning or nocturnal headaches. We talked about the importance of maintaining a healthy lifestyle and striving for healthy weight.  The importance of complete smoking cessation was also addressed.  In addition, we talked about the importance of striving for and maintaining good sleep hygiene. I recommended a sleep study at this time. I outlined the differences between a laboratory attended sleep study which is considered more comprehensive and accurate over the option of a home sleep test (HST); the latter may lead to underestimation of sleep disordered breathing in some instances and does not help with diagnosing upper airway resistance syndrome and is not accurate enough to diagnose primary central sleep apnea typically. I outlined possible surgical and non-surgical treatment options of OSA, including the use of a positive airway pressure (PAP) device (i.e. CPAP, AutoPAP/APAP or BiPAP in certain circumstances), a custom-made dental device (aka oral appliance, which would require a referral to a specialist dentist or orthodontist typically, and is generally speaking not considered for patients with full dentures or edentulous state), upper airway surgical options, such as traditional UPPP (which is not considered a first-line treatment) or the Inspire device (hypoglossal nerve stimulator, which would involve a referral for consultation with an ENT surgeon, after careful selection, following inclusion  criteria - also not first-line treatment). I explained the PAP treatment option to the patient in detail, as this is generally considered first-line treatment.  The patient indicated that she would be willing to try PAP therapy, if the need arises. I explained the importance of being compliant with PAP treatment, not only for insurance purposes but primarily to improve patient's symptoms symptoms, and for the patient's long term health benefit,  including to reduce Her cardiovascular risks longer-term.    We will pick up our discussion about the next steps and treatment options after testing.  We will keep her posted as to the test results by phone call and/or MyChart messaging where possible.  We will plan to follow-up in sleep clinic accordingly as well.  I answered all her questions today and the patient and was in agreement.   I encouraged her to call with any interim questions, concerns, problems or updates or email [place] through MyChart.  Generally speaking, sleep test authorizations may take up to 2 weeks, sometimes less, sometimes longer, the patient is encouraged to get in touch with [place] if they do not hear back from the sleep lab staff directly within the next 2 weeks.  Thank you very much for allowing me to participate in the care of this nice patient. If I can be of any further assistance to you please do not hesitate to call me at [phone].  Sincerely,   Huston Foley, MD, PhD

## 2023-09-08 DIAGNOSIS — M9903 Segmental and somatic dysfunction of lumbar region: Secondary | ICD-10-CM | POA: Diagnosis not present

## 2023-09-08 DIAGNOSIS — M9905 Segmental and somatic dysfunction of pelvic region: Secondary | ICD-10-CM | POA: Diagnosis not present

## 2023-09-08 DIAGNOSIS — M5432 Sciatica, left side: Secondary | ICD-10-CM | POA: Diagnosis not present

## 2023-09-08 DIAGNOSIS — Z72820 Sleep deprivation: Secondary | ICD-10-CM | POA: Diagnosis not present

## 2023-09-15 DIAGNOSIS — M9903 Segmental and somatic dysfunction of lumbar region: Secondary | ICD-10-CM | POA: Diagnosis not present

## 2023-09-15 DIAGNOSIS — M9905 Segmental and somatic dysfunction of pelvic region: Secondary | ICD-10-CM | POA: Diagnosis not present

## 2023-09-15 DIAGNOSIS — M5432 Sciatica, left side: Secondary | ICD-10-CM | POA: Diagnosis not present

## 2023-09-15 DIAGNOSIS — Z72820 Sleep deprivation: Secondary | ICD-10-CM | POA: Diagnosis not present

## 2023-09-22 DIAGNOSIS — M9903 Segmental and somatic dysfunction of lumbar region: Secondary | ICD-10-CM | POA: Diagnosis not present

## 2023-09-22 DIAGNOSIS — M9905 Segmental and somatic dysfunction of pelvic region: Secondary | ICD-10-CM | POA: Diagnosis not present

## 2023-09-22 DIAGNOSIS — M5432 Sciatica, left side: Secondary | ICD-10-CM | POA: Diagnosis not present

## 2023-09-22 DIAGNOSIS — Z72820 Sleep deprivation: Secondary | ICD-10-CM | POA: Diagnosis not present

## 2023-09-29 ENCOUNTER — Ambulatory Visit: Payer: BC Managed Care – PPO | Admitting: Neurology

## 2023-09-29 DIAGNOSIS — M9903 Segmental and somatic dysfunction of lumbar region: Secondary | ICD-10-CM | POA: Diagnosis not present

## 2023-09-29 DIAGNOSIS — M9905 Segmental and somatic dysfunction of pelvic region: Secondary | ICD-10-CM | POA: Diagnosis not present

## 2023-09-29 DIAGNOSIS — Z789 Other specified health status: Secondary | ICD-10-CM

## 2023-09-29 DIAGNOSIS — I493 Ventricular premature depolarization: Secondary | ICD-10-CM

## 2023-09-29 DIAGNOSIS — G4734 Idiopathic sleep related nonobstructive alveolar hypoventilation: Secondary | ICD-10-CM

## 2023-09-29 DIAGNOSIS — F17200 Nicotine dependence, unspecified, uncomplicated: Secondary | ICD-10-CM

## 2023-09-29 DIAGNOSIS — G4733 Obstructive sleep apnea (adult) (pediatric): Secondary | ICD-10-CM | POA: Diagnosis not present

## 2023-09-29 DIAGNOSIS — R0683 Snoring: Secondary | ICD-10-CM

## 2023-09-29 DIAGNOSIS — R519 Headache, unspecified: Secondary | ICD-10-CM

## 2023-09-29 DIAGNOSIS — Z9189 Other specified personal risk factors, not elsewhere classified: Secondary | ICD-10-CM

## 2023-09-29 DIAGNOSIS — M5432 Sciatica, left side: Secondary | ICD-10-CM | POA: Diagnosis not present

## 2023-09-29 DIAGNOSIS — G4719 Other hypersomnia: Secondary | ICD-10-CM

## 2023-09-29 DIAGNOSIS — Z72820 Sleep deprivation: Secondary | ICD-10-CM | POA: Diagnosis not present

## 2023-10-06 DIAGNOSIS — M5432 Sciatica, left side: Secondary | ICD-10-CM | POA: Diagnosis not present

## 2023-10-06 DIAGNOSIS — Z72820 Sleep deprivation: Secondary | ICD-10-CM | POA: Diagnosis not present

## 2023-10-06 DIAGNOSIS — M9905 Segmental and somatic dysfunction of pelvic region: Secondary | ICD-10-CM | POA: Diagnosis not present

## 2023-10-06 DIAGNOSIS — M9903 Segmental and somatic dysfunction of lumbar region: Secondary | ICD-10-CM | POA: Diagnosis not present

## 2023-10-13 DIAGNOSIS — M9903 Segmental and somatic dysfunction of lumbar region: Secondary | ICD-10-CM | POA: Diagnosis not present

## 2023-10-13 DIAGNOSIS — M9905 Segmental and somatic dysfunction of pelvic region: Secondary | ICD-10-CM | POA: Diagnosis not present

## 2023-10-13 DIAGNOSIS — Z72820 Sleep deprivation: Secondary | ICD-10-CM | POA: Diagnosis not present

## 2023-10-13 DIAGNOSIS — M5432 Sciatica, left side: Secondary | ICD-10-CM | POA: Diagnosis not present

## 2023-10-13 NOTE — Progress Notes (Signed)
 See procedure note.

## 2023-10-17 ENCOUNTER — Telehealth: Payer: Self-pay | Admitting: *Deleted

## 2023-10-17 NOTE — Telephone Encounter (Signed)
-----   Message from Huston Foley sent at 10/17/2023  4:34 PM EDT ----- Urgent set up requested on PAP therapy, due to severe OSA. Patient referred by PCP, seen by me on 09/06/2023, patient had a HST on 10/05/2023.    Please call and notify the patient that the recent home sleep test showed obstructive sleep apnea in the severe range. I recommend treatment for this in the form of autoPAP, which means, that we don't have to bring her in for a sleep study with CPAP, but will let her start using a so called autoPAP machine at home, through a DME company (of her choice, or as per insurance requirement). The DME representative will fit the patient with a mask of choice, educate her on how to use the machine, how to put the mask on, etc. I have placed an order in the chart. Please send the order to a local DME, talk to patient, send report to referring MD. Please also reinforce the need for compliance with treatment. We will need a FU in sleep clinic for 10 weeks post-PAP set up, please arrange that with me or one of our NPs.  I also strongly recommend smoking cessation, I recommend she follow-up with PCP regarding this.  Her oxygen level dropped quite significantly during sleep at times.  This can be worse in smokers with sleep apnea versus non-smokers with sleep apnea. Thanks,   Huston Foley, MD, PhD [facility])

## 2023-10-17 NOTE — Telephone Encounter (Signed)
 Called pt to discuss sleep study results. This wasn't the best of time for the patient so I am going to call her back tomorrow AM. She was appreciative.

## 2023-10-17 NOTE — Procedures (Signed)
 [facility]  HOME SLEEP TEST (SANSA) REPORT (Mail-Out Device):   STUDY DATE: 10/05/2023  DOB: 1966-02-05  MRN: [id number]  ORDERING CLINICIAN: Huston Foley, MD, PhD   REFERRING CLINICIAN: Arnette Felts, FNP   CLINICAL INFORMATION/HISTORY: 58 year old female with an underlying medical history of rec. pancreatitis, smoking, COPD, hearing loss, reflux disease, hypothyroidism, PVCs, obesity, who reports snoring and excessive daytime somnolence, difficulty falling asleep at night and nonrestorative sleep.   PATIENT'S LAST REPORTED EPWORTH SLEEPINESS SCORE (ESS): 6/24.  BMI (at the time of sleep clinic visit and/or test date): 33 kg/m  FINDINGS:   Study Protocol:    The SANSA single-point-of-skin-contact chest-worn sensor - an FDA cleared and DOT approved type 4 home sleep test device - measures eight physiological channels,  including blood oxygen saturation (measured via PPG [photoplethysmography]), EKG-derived heart rate, respiratory effort, chest movement (measured via accelerometer), snoring, body position, and actigraphy. The device is designed to be worn for up to 10 hours per study.   Sleep Summary:   Total Recording Time (hours, min): 8 hours, 30 min  Total Effective Sleep Time (hours, min):  6 hours, 21 min  Sleep Efficiency (%):    78%   Respiratory Indices:   Calculated sAHI (per hour):  30.3/hour         Oxygen Saturation Statistics:    Oxygen Saturation (%) Mean: 89%   Minimum oxygen saturation (%):                 55.4%   O2 Saturation Range (%): 55.4 - 96.6%   Time below or at 88% saturation: 112 min   Pulse Rate Statistics:   Pulse Mean (bpm):    77/min    Pulse Range (65 - 116/min)   Snoring: Mild to moderate  IMPRESSION/DIAGNOSES:   OSA (obstructive sleep apnea), severe Nocturnal Hypoxemia  RECOMMENDATIONS:   This home sleep test demonstrates severe obstructive sleep apnea with a total AHI of 30.3/hour and O2 nadir of  55.4% with significant time below or at 88% saturation of over 90 minutes for the study, indicating nocturnal hypoxemia. Snoring was detected, in the mild to moderate range.  Urgent treatment with positive airway pressure is highly recommended. The patient will be advised to proceed with an autoPAP titration/trial at home. A laboratory attended titration study can be considered in the future for optimization of treatment settings and to improve tolerance and compliance, if needed, down the road. Alternative treatment options are limited secondary to the severity of the patient's sleep disordered breathing, but may include surgical treatment with an implantable hypoglossal nerve stimulator (in carefully selected candidates, meeting criteria).  Concomitant weight loss is recommended (where clinically appropriate).  Smoking cessation should be strongly pursued.  Please note, that untreated obstructive sleep apnea may carry additional perioperative morbidity. Patients with significant obstructive sleep apnea should receive perioperative PAP therapy and the surgeons and particularly the anesthesiologist should be informed of the diagnosis and the severity of the sleep disordered breathing. The patient should be cautioned not to drive, work at heights, or operate dangerous or heavy equipment when tired or sleepy. Review and reiteration of good sleep hygiene measures should be pursued with any patient. Other causes of the patient's symptoms, including circadian rhythm disturbances, an underlying mood disorder, medication effect and/or an underlying medical problem cannot be ruled out based on this test. Clinical correlation is recommended.  The patient and her referring provider will be notified of the test results. The patient will be seen in follow  up in sleep clinic at [facility].  I certify that I have reviewed the raw data recording prior to the issuance of this report in accordance with the standards of the American  Academy of Sleep Medicine (AASM).    INTERPRETING PHYSICIAN:   Huston Foley, MD, PhD Medical Director, [facility]) Diplomat, ABPN (Neurology and Sleep)   [facility] [address] [place], [place] [address] [phone]

## 2023-10-17 NOTE — Addendum Note (Signed)
 Addended by: Huston Foley on: 10/17/2023 04:34 PM   Modules accepted: Orders

## 2023-10-18 DIAGNOSIS — M5432 Sciatica, left side: Secondary | ICD-10-CM | POA: Diagnosis not present

## 2023-10-18 DIAGNOSIS — Z72820 Sleep deprivation: Secondary | ICD-10-CM | POA: Diagnosis not present

## 2023-10-18 DIAGNOSIS — M9903 Segmental and somatic dysfunction of lumbar region: Secondary | ICD-10-CM | POA: Diagnosis not present

## 2023-10-18 DIAGNOSIS — M9905 Segmental and somatic dysfunction of pelvic region: Secondary | ICD-10-CM | POA: Diagnosis not present

## 2023-10-19 NOTE — Telephone Encounter (Signed)
 I called the patient and discussed her sleep study results. The patient is amenable to proceeding with autopap therapy setup by [facility]. We discussed the insurance requirements for compliance which includes using the machine at least 4 hours at night and being seen by our office for initial follow-up between 30 and 90 days after setup. The patient's questions were answered. She will watch for a call from [facility] within a few business days. Pt aware our goal for her is to use the machine all night long. Pt scheduled f/u for 5/29 at 9 am with Amy NP. She thanked me for the call.  Urgent setup order sent to [facility]. Sleep study result sent to referring provider.

## 2023-10-19 NOTE — Telephone Encounter (Signed)
 Zott, Linnell Fulling, Otilio Jefferson, RN; [place], [place] Got It Thank  You

## 2023-10-20 DIAGNOSIS — M5432 Sciatica, left side: Secondary | ICD-10-CM | POA: Diagnosis not present

## 2023-10-20 DIAGNOSIS — Z72820 Sleep deprivation: Secondary | ICD-10-CM | POA: Diagnosis not present

## 2023-10-20 DIAGNOSIS — M9903 Segmental and somatic dysfunction of lumbar region: Secondary | ICD-10-CM | POA: Diagnosis not present

## 2023-10-20 DIAGNOSIS — M9905 Segmental and somatic dysfunction of pelvic region: Secondary | ICD-10-CM | POA: Diagnosis not present

## 2023-10-22 DIAGNOSIS — M4726 Other spondylosis with radiculopathy, lumbar region: Secondary | ICD-10-CM | POA: Diagnosis not present

## 2023-10-22 DIAGNOSIS — M5416 Radiculopathy, lumbar region: Secondary | ICD-10-CM | POA: Insufficient documentation

## 2023-10-22 DIAGNOSIS — M47816 Spondylosis without myelopathy or radiculopathy, lumbar region: Secondary | ICD-10-CM | POA: Insufficient documentation

## 2023-10-25 DIAGNOSIS — G4733 Obstructive sleep apnea (adult) (pediatric): Secondary | ICD-10-CM | POA: Diagnosis not present

## 2023-10-26 DIAGNOSIS — M5416 Radiculopathy, lumbar region: Secondary | ICD-10-CM | POA: Diagnosis not present

## 2023-10-27 DIAGNOSIS — M9903 Segmental and somatic dysfunction of lumbar region: Secondary | ICD-10-CM | POA: Diagnosis not present

## 2023-10-27 DIAGNOSIS — M5432 Sciatica, left side: Secondary | ICD-10-CM | POA: Diagnosis not present

## 2023-10-27 DIAGNOSIS — Z72820 Sleep deprivation: Secondary | ICD-10-CM | POA: Diagnosis not present

## 2023-10-27 DIAGNOSIS — M9905 Segmental and somatic dysfunction of pelvic region: Secondary | ICD-10-CM | POA: Diagnosis not present

## 2023-10-30 DIAGNOSIS — M5432 Sciatica, left side: Secondary | ICD-10-CM | POA: Diagnosis not present

## 2023-10-30 DIAGNOSIS — M9903 Segmental and somatic dysfunction of lumbar region: Secondary | ICD-10-CM | POA: Diagnosis not present

## 2023-10-30 DIAGNOSIS — Z72820 Sleep deprivation: Secondary | ICD-10-CM | POA: Diagnosis not present

## 2023-10-30 DIAGNOSIS — M9905 Segmental and somatic dysfunction of pelvic region: Secondary | ICD-10-CM | POA: Diagnosis not present

## 2023-11-03 DIAGNOSIS — F40240 Claustrophobia: Secondary | ICD-10-CM | POA: Diagnosis not present

## 2023-11-03 DIAGNOSIS — M5432 Sciatica, left side: Secondary | ICD-10-CM | POA: Diagnosis not present

## 2023-11-03 DIAGNOSIS — Z72820 Sleep deprivation: Secondary | ICD-10-CM | POA: Diagnosis not present

## 2023-11-03 DIAGNOSIS — M9905 Segmental and somatic dysfunction of pelvic region: Secondary | ICD-10-CM | POA: Diagnosis not present

## 2023-11-03 DIAGNOSIS — M9903 Segmental and somatic dysfunction of lumbar region: Secondary | ICD-10-CM | POA: Diagnosis not present

## 2023-11-03 DIAGNOSIS — M4726 Other spondylosis with radiculopathy, lumbar region: Secondary | ICD-10-CM | POA: Diagnosis not present

## 2023-11-06 DIAGNOSIS — R0602 Shortness of breath: Secondary | ICD-10-CM | POA: Diagnosis not present

## 2023-11-06 DIAGNOSIS — M5416 Radiculopathy, lumbar region: Secondary | ICD-10-CM | POA: Diagnosis not present

## 2023-11-06 DIAGNOSIS — J209 Acute bronchitis, unspecified: Secondary | ICD-10-CM | POA: Diagnosis not present

## 2023-11-06 DIAGNOSIS — M9903 Segmental and somatic dysfunction of lumbar region: Secondary | ICD-10-CM | POA: Diagnosis not present

## 2023-11-06 DIAGNOSIS — M9905 Segmental and somatic dysfunction of pelvic region: Secondary | ICD-10-CM | POA: Diagnosis not present

## 2023-11-06 DIAGNOSIS — M5432 Sciatica, left side: Secondary | ICD-10-CM | POA: Diagnosis not present

## 2023-11-06 DIAGNOSIS — Z72820 Sleep deprivation: Secondary | ICD-10-CM | POA: Diagnosis not present

## 2023-11-10 DIAGNOSIS — Z72820 Sleep deprivation: Secondary | ICD-10-CM | POA: Diagnosis not present

## 2023-11-10 DIAGNOSIS — M9905 Segmental and somatic dysfunction of pelvic region: Secondary | ICD-10-CM | POA: Diagnosis not present

## 2023-11-10 DIAGNOSIS — M9903 Segmental and somatic dysfunction of lumbar region: Secondary | ICD-10-CM | POA: Diagnosis not present

## 2023-11-10 DIAGNOSIS — Z4889 Encounter for other specified surgical aftercare: Secondary | ICD-10-CM | POA: Diagnosis not present

## 2023-11-10 DIAGNOSIS — M5432 Sciatica, left side: Secondary | ICD-10-CM | POA: Diagnosis not present

## 2023-11-13 DIAGNOSIS — M9905 Segmental and somatic dysfunction of pelvic region: Secondary | ICD-10-CM | POA: Diagnosis not present

## 2023-11-13 DIAGNOSIS — M5432 Sciatica, left side: Secondary | ICD-10-CM | POA: Diagnosis not present

## 2023-11-13 DIAGNOSIS — Z72820 Sleep deprivation: Secondary | ICD-10-CM | POA: Diagnosis not present

## 2023-11-13 DIAGNOSIS — M9903 Segmental and somatic dysfunction of lumbar region: Secondary | ICD-10-CM | POA: Diagnosis not present

## 2023-11-20 DIAGNOSIS — M5432 Sciatica, left side: Secondary | ICD-10-CM | POA: Diagnosis not present

## 2023-11-20 DIAGNOSIS — M9903 Segmental and somatic dysfunction of lumbar region: Secondary | ICD-10-CM | POA: Diagnosis not present

## 2023-11-20 DIAGNOSIS — M9905 Segmental and somatic dysfunction of pelvic region: Secondary | ICD-10-CM | POA: Diagnosis not present

## 2023-11-20 DIAGNOSIS — Z72820 Sleep deprivation: Secondary | ICD-10-CM | POA: Diagnosis not present

## 2023-11-24 DIAGNOSIS — M5416 Radiculopathy, lumbar region: Secondary | ICD-10-CM | POA: Diagnosis not present

## 2023-11-25 DIAGNOSIS — G4733 Obstructive sleep apnea (adult) (pediatric): Secondary | ICD-10-CM | POA: Diagnosis not present

## 2023-12-04 DIAGNOSIS — M9905 Segmental and somatic dysfunction of pelvic region: Secondary | ICD-10-CM | POA: Diagnosis not present

## 2023-12-04 DIAGNOSIS — M9903 Segmental and somatic dysfunction of lumbar region: Secondary | ICD-10-CM | POA: Diagnosis not present

## 2023-12-04 DIAGNOSIS — Z72820 Sleep deprivation: Secondary | ICD-10-CM | POA: Diagnosis not present

## 2023-12-04 DIAGNOSIS — M5432 Sciatica, left side: Secondary | ICD-10-CM | POA: Diagnosis not present

## 2023-12-08 DIAGNOSIS — M5416 Radiculopathy, lumbar region: Secondary | ICD-10-CM | POA: Diagnosis not present

## 2023-12-08 DIAGNOSIS — M5432 Sciatica, left side: Secondary | ICD-10-CM | POA: Diagnosis not present

## 2023-12-08 DIAGNOSIS — M9905 Segmental and somatic dysfunction of pelvic region: Secondary | ICD-10-CM | POA: Diagnosis not present

## 2023-12-08 DIAGNOSIS — M9901 Segmental and somatic dysfunction of cervical region: Secondary | ICD-10-CM | POA: Diagnosis not present

## 2023-12-08 DIAGNOSIS — M9903 Segmental and somatic dysfunction of lumbar region: Secondary | ICD-10-CM | POA: Diagnosis not present

## 2023-12-09 DIAGNOSIS — M5412 Radiculopathy, cervical region: Secondary | ICD-10-CM | POA: Diagnosis not present

## 2023-12-09 DIAGNOSIS — M62838 Other muscle spasm: Secondary | ICD-10-CM | POA: Diagnosis not present

## 2023-12-12 DIAGNOSIS — R09A2 Foreign body sensation, throat: Secondary | ICD-10-CM | POA: Diagnosis not present

## 2023-12-13 ENCOUNTER — Encounter: Payer: Self-pay | Admitting: Nurse Practitioner

## 2023-12-14 ENCOUNTER — Other Ambulatory Visit: Payer: Self-pay

## 2023-12-14 ENCOUNTER — Emergency Department (HOSPITAL_COMMUNITY)
Admission: EM | Admit: 2023-12-14 | Discharge: 2023-12-14 | Disposition: A | Discharge status: Home / Self Care | Attending: Emergency Medicine | Admitting: Emergency Medicine

## 2023-12-14 ENCOUNTER — Encounter (HOSPITAL_COMMUNITY): Payer: Self-pay

## 2023-12-14 DIAGNOSIS — Z91010 Allergy to peanuts: Secondary | ICD-10-CM | POA: Insufficient documentation

## 2023-12-14 DIAGNOSIS — J449 Chronic obstructive pulmonary disease, unspecified: Secondary | ICD-10-CM | POA: Insufficient documentation

## 2023-12-14 DIAGNOSIS — M542 Cervicalgia: Secondary | ICD-10-CM | POA: Diagnosis not present

## 2023-12-14 DIAGNOSIS — R070 Pain in throat: Secondary | ICD-10-CM | POA: Insufficient documentation

## 2023-12-14 LAB — CBC WITH DIFFERENTIAL/PLATELET
Abs Immature Granulocytes: 0.04 10*3/uL (ref 0.00–0.07)
Basophils Absolute: 0.1 10*3/uL (ref 0.0–0.1)
Basophils Relative: 1 %
Eosinophils Absolute: 0.1 10*3/uL (ref 0.0–0.5)
Eosinophils Relative: 1 %
HCT: 37.9 % (ref 36.0–46.0)
Hemoglobin: 11.7 g/dL — ABNORMAL LOW (ref 12.0–15.0)
Immature Granulocytes: 0 %
Lymphocytes Relative: 25 %
Lymphs Abs: 2.9 10*3/uL (ref 0.7–4.0)
MCH: 26 pg (ref 26.0–34.0)
MCHC: 30.9 g/dL (ref 30.0–36.0)
MCV: 84.2 fL (ref 80.0–100.0)
Monocytes Absolute: 0.9 10*3/uL (ref 0.1–1.0)
Monocytes Relative: 8 %
Neutro Abs: 7.4 10*3/uL (ref 1.7–7.7)
Neutrophils Relative %: 65 %
Platelets: 369 10*3/uL (ref 150–400)
RBC: 4.5 MIL/uL (ref 3.87–5.11)
RDW: 17.1 % — ABNORMAL HIGH (ref 11.5–15.5)
WBC: 11.5 10*3/uL — ABNORMAL HIGH (ref 4.0–10.5)
nRBC: 0 % (ref 0.0–0.2)

## 2023-12-14 LAB — BASIC METABOLIC PANEL WITH GFR
Anion gap: 11 (ref 5–15)
BUN: 11 mg/dL (ref 6–20)
CO2: 26 mmol/L (ref 22–32)
Calcium: 8.9 mg/dL (ref 8.9–10.3)
Chloride: 103 mmol/L (ref 98–111)
Creatinine, Ser: 0.79 mg/dL (ref 0.44–1.00)
GFR, Estimated: >60 mL/min (ref >60)
Glucose, Bld: 90 mg/dL (ref 70–99)
Potassium: 3.6 mmol/L (ref 3.5–5.1)
Sodium: 140 mmol/L (ref 135–145)

## 2023-12-14 MED ORDER — LIDOCAINE VISCOUS HCL 2 % MT SOLN
15.0000 mL | Freq: Once | OROMUCOSAL | Status: DC
  Filled 2023-12-14: qty 15

## 2023-12-14 MED ORDER — ACYCLOVIR 400 MG PO TABS: 400.0000 mg | ORAL_TABLET | Freq: Four times a day (QID) | ORAL | 0 refills | Status: AC

## 2023-12-14 NOTE — ED Triage Notes (Signed)
 Pt came in via POV d/t the past week having the Lt side of her neck hurting very bad. Thought something was stuck in her throat & saw ENT & after a scope used in their office was told "Erythema on tip of epiglottis on the left side." Today she is still the same with no changes & her symptoms are still so uncomfortable at times she feels as though the numbing medicine she was given makes it hard to breath. A/Ox4, rates her pain 6/10, no difficulty breathing during triage.

## 2023-12-14 NOTE — Discharge Instructions (Addendum)
 Your neck pain may be due to shingles.  Take the antiviral medication as prescribed and follow-up closely with your doctor for further care.

## 2023-12-14 NOTE — ED Provider Notes (Signed)
 [facility] Provider Note   CSN: [id number] Arrival date & time: 12/14/23  1124     History  Chief Complaint  Patient presents with   Throat pain   Neck Pain    Dana Martin is a 58 y.o. female.  The history is provided by the patient and medical records. No language interpreter was used.     58 year old female history of GERD, COPD, sinus congestion, recurrent pancreatitis, recurrent epistaxis presenting to the ER with complaints of neck pain.  Patient states for the past week she has had intense pain to the left side of her neck.  Pain is persistent, sharp stabbing throbbing burning uncomfortable with associated throat irritation.  She does not endorse any fever or chills no headache no chest pain or trouble breathing.  She was seen at ENT office 2 days ago and had an endoscopy done.  She was told that there is some mild irritation to her epiglottis but no acute finding were noted.  She was prescribed mild splints which she reported no improvement.  Today she noticed a rash that appeared on the left side of her neck.  She did recall having shingle to the same area 15 years ago and at this time she worries that she may develop shingles.  She states that she has been going through quite a bit of stress since January of this year going through multiple medical ailment.  She denies any trauma to her neck denies any focal numbness or focal weakness.  No vision complaint.  Home Medications Prior to Admission medications   Medication Sig Start Date End Date Taking? Authorizing Provider  atorvastatin  (LIPITOR) 10 MG tablet Take 1 tablet (10 mg total) by mouth daily. 01/15/23 01/15/24  Susanna Epley, FNP  Cholecalciferol (VITAMIN D3) 25 MCG (1000 UT) CAPS Take 2 capsules by mouth daily. daily    [provider]  diphenhydrAMINE  (BENADRYL ) 25 MG tablet Take 25-50 mg by mouth daily as needed for itching (allergic reaction).     [provider]  famotidine (PEPCID) 10 MG tablet Take 10 mg by mouth daily.    [provider]  gabapentin  (NEURONTIN ) 100 MG capsule Take 1 capsule (100 mg total) by mouth at bedtime. 12/19/22 07/25/23  Susanna Epley, FNP  ibuprofen  (ADVIL ) 200 MG tablet Take 400-800 mg by mouth every 6 (six) hours as needed for headache or moderate pain.    [provider]  levocetirizine (XYZAL ) 5 MG tablet Take 5 mg by mouth at bedtime.    [provider]  Polyethyl Glycol-Propyl Glycol 0.4-0.3 % SOLN Place 1 drop into both eyes 5 (five) times daily as needed (dry eyes/irriation).    [provider]  predniSONE  (STERAPRED UNI-PAK 21 TAB) 10 MG (21) TBPK tablet Take as directed 08/07/23   Susanna Epley, FNP  SYNTHROID  175 MCG tablet TAKE 1 TABLET BY MOUTH EVERY DAY 06/21/23   Moore, Janece, FNP      Allergies    Fish allergy, Gadolinium derivatives, Multihance  [gadobenate], Omnipaque  [iohexol ], Other, Peanut-containing drug products, Shellfish allergy, Codeine, Hydrocodone -acetaminophen , Oxycodone , and Tramadol     Review of Systems   Review of Systems  All other systems reviewed and are negative.   Physical Exam Updated Vital Signs BP (!) 136/91 (BP Location: Right Arm)   Pulse 98   Temp 97.9 F (36.6 C)   Resp 16   Ht 5\' 4"  (1.626 m)   Wt 89.8 kg   SpO2 97%  BMI 33.99 kg/m  Physical Exam Vitals and nursing note reviewed.  Constitutional:      General: She is not in acute distress.    Appearance: She is well-developed.  HENT:     Head: Atraumatic.     Mouth/Throat:     Mouth: Mucous membranes are moist.     Comments: Patient is wearing denture.  Throat exam unremarkable uvula midline no tonsillar enlargement or exudates Eyes:     Conjunctiva/sclera: Conjunctivae normal.  Neck:     Comments: Mild tenderness to palpation of the skin on the left side of her neck with 1 single maculopapular lesion in the affected area.  No erythema no warmth no petechial  pustular or vesicular rash at this time.  No nuchal rigidity, no cervical midline spine tenderness.  Neck with full range of motion.  No lymphadenopathy. Pulmonary:     Effort: Pulmonary effort is normal.  Musculoskeletal:     Cervical back: Normal range of motion and neck supple.  Skin:    Findings: No rash.  Neurological:     Mental Status: She is alert.  Psychiatric:        Mood and Affect: Mood normal.     ED Results / Procedures / Treatments   Labs (all labs ordered are listed, but only abnormal results are displayed) Labs Reviewed  CBC WITH DIFFERENTIAL/PLATELET - Abnormal; Notable for the following components:      Result Value   WBC 11.5 (*)    Hemoglobin 11.7 (*)    RDW 17.1 (*)    All other components within normal limits  BASIC METABOLIC PANEL WITH GFR    EKG None  Radiology No results found.  Procedures Procedures    Medications Ordered in ED Medications  lidocaine  (XYLOCAINE ) 2 % viscous mouth solution 15 mL (15 mLs Mouth/Throat Patient Refused/Not Given 12/14/23 1236)    ED Course/ Medical Decision Making/ A&P                                 Medical Decision Making Amount and/or Complexity of Data Reviewed Labs: ordered.  Risk Prescription drug management.   BP (!) 136/91 (BP Location: Right Arm)   Pulse 98   Temp 97.9 F (36.6 C)   Resp 16   Ht 5\' 4"  (1.626 m)   Wt 89.8 kg   SpO2 97%   BMI 33.99 kg/m   58:37 PM 58 year old female history of GERD, COPD, sinus congestion, recurrent pancreatitis, recurrent epistaxis presenting to the ER with complaints of neck pain.  Patient states for the past week she has had intense pain to the left side of her neck.  Pain is persistent, sharp stabbing throbbing burning uncomfortable with associated throat irritation.  She does not endorse any fever or chills no headache no chest pain or trouble breathing.  She was seen at ENT office 2 days ago and had an endoscopy done.  She was told that there is some  mild irritation to her epiglottis but no acute finding were noted.  She was prescribed mild splints which she reported no improvement.  Today she noticed a rash that appeared on the left side of her neck.  She did recall having shingle to the same area 15 years ago and at this time she worries that she may develop shingles.  She states that she has been going through quite a bit of stress since January of this year going  through multiple medical ailment.  She denies any trauma to her neck denies any focal numbness or focal weakness.  No vision complaint.  On exam patient is resting comfortably in the chair appears to be in no acute discomfort.  She has a mild tenderness to palpation of the left lateral neck.  Patient also has a single maculopapular lesion to the affected area.  No abscess no obvious vesicles.  I suspect her symptoms may be early shingles causing her discomfort.  I have considered next CT soft tissue but I felt it is low yield.  I doubt she has parotitis or deep space infection.  She also had recent endoscopy and it was fairly unremarkable.  Patient overall well-appearing.  Since patient has had shingles area, I will prescribe acyclovir as treatment.  I offer gabapentin  as well but patient states she has not available at home.  I gave patient return precaution.  I have reviewed EMR and considered in my evaluation.        Final Clinical Impression(s) / ED Diagnoses Final diagnoses:  Neck pain on left side    Rx / DC Orders ED Discharge Orders          Ordered    acyclovir (ZOVIRAX) 400 MG tablet  4 times daily        12/14/23 1321              Debbra Fairy, PA-C 12/14/23 1322    Albertus Hughs, DO 12/14/23 1333

## 2023-12-18 DIAGNOSIS — M9901 Segmental and somatic dysfunction of cervical region: Secondary | ICD-10-CM | POA: Diagnosis not present

## 2023-12-18 DIAGNOSIS — M9905 Segmental and somatic dysfunction of pelvic region: Secondary | ICD-10-CM | POA: Diagnosis not present

## 2023-12-18 DIAGNOSIS — M5432 Sciatica, left side: Secondary | ICD-10-CM | POA: Diagnosis not present

## 2023-12-18 DIAGNOSIS — M9903 Segmental and somatic dysfunction of lumbar region: Secondary | ICD-10-CM | POA: Diagnosis not present

## 2023-12-19 NOTE — Progress Notes (Unsigned)
 [place], CMA,acting as a Neurosurgeon for Susanna Epley, FNP.,have documented all relevant documentation on the behalf of Susanna Epley, FNP,as directed by  Susanna Epley, FNP while in the presence of Susanna Epley, FNP.  Subjective:    Patient ID: Dana Martin , female    DOB: Nov 15, 1965 , 58 y.o.   MRN: [id number]  No chief complaint on file.   HPI  HPI   Past Medical History:  Diagnosis Date   Acute pancreatitis 12/06/2013   Adverse food reaction 09/04/2019   Angio-edema    COPD (chronic obstructive pulmonary disease) (HCC)    denies SOB with daily activities   Epistaxis, recurrent 10/07/2022   Full dentures    GERD (gastroesophageal reflux disease)    History of pancreatitis    Hypothyroidism    Nasal sinus congestion 05/08/2014   with sinus drainage   Pancreatitis    PVC (premature ventricular contraction) 07/07/2017   Stress incontinence    Symptomatic cholelithiasis 05/2014   Urticaria    Varicose veins of bilateral lower extremities with pain    Wears dentures    upper and lower   Wears glasses      Family History  Problem Relation Age of Onset   Hypertension Mother    Atrial fibrillation Mother    Heart attack Mother    Hypertension Sister    Hypertension Brother    Hypertension Daughter    Lung cancer Brother    Diabetes Sister      Current Outpatient Medications:    acyclovir  (ZOVIRAX ) 400 MG tablet, Take 1 tablet (400 mg total) by mouth 4 (four) times daily., Disp: 50 tablet, Rfl: 0   atorvastatin  (LIPITOR) 10 MG tablet, Take 1 tablet (10 mg total) by mouth daily., Disp: 30 tablet, Rfl: 3   Cholecalciferol (VITAMIN D3) 25 MCG (1000 UT) CAPS, Take 2 capsules by mouth daily. daily, Disp: , Rfl:    diphenhydrAMINE  (BENADRYL ) 25 MG tablet, Take 25-50 mg by mouth daily as needed for itching (allergic reaction). , Disp: , Rfl:    famotidine (PEPCID) 10 MG tablet, Take 10 mg by mouth daily., Disp: , Rfl:    gabapentin  (NEURONTIN ) 100 MG capsule, Take 1  capsule (100 mg total) by mouth at bedtime., Disp: 30 capsule, Rfl: 2   ibuprofen  (ADVIL ) 200 MG tablet, Take 400-800 mg by mouth every 6 (six) hours as needed for headache or moderate pain., Disp: , Rfl:    levocetirizine (XYZAL ) 5 MG tablet, Take 5 mg by mouth at bedtime., Disp: , Rfl:    Polyethyl Glycol-Propyl Glycol 0.4-0.3 % SOLN, Place 1 drop into both eyes 5 (five) times daily as needed (dry eyes/irriation)., Disp: , Rfl:    predniSONE  (STERAPRED UNI-PAK 21 TAB) 10 MG (21) TBPK tablet, Take as directed, Disp: 21 tablet, Rfl: 0   SYNTHROID  175 MCG tablet, TAKE 1 TABLET BY MOUTH EVERY DAY, Disp: 90 tablet, Rfl: 1   Allergies  Allergen Reactions   Fish Allergy Hives   Gadolinium Derivatives Hives, Itching and Rash    Pt stated she felt itching and burning in her throat immediately following the injection.   Multihance  [Gadobenate] Hives, Itching and Rash    Itching and burning in her throat. Hives and itching of her skin.   Omnipaque  [Iohexol ] Hives   Other Nausea And Vomiting    ALL NARCOTICS   Peanut-Containing Drug Products Hives   Shellfish Allergy Hives   Codeine Nausea And Vomiting   Hydrocodone -Acetaminophen  Other (See Comments)  Oxycodone  Nausea And Vomiting   Tramadol      headaches      The patient states she uses {contraceptive methods:5051} for birth control. No LMP recorded. Patient has had a hysterectomy.. {Dysmenorrhea-menorrhagia:21918}. Negative for: breast discharge, breast lump(s), breast pain and breast self exam. Associated symptoms include abnormal vaginal bleeding. Pertinent negatives include abnormal bleeding (hematology), anxiety, decreased libido, depression, difficulty falling sleep, dyspareunia, history of infertility, nocturia, sexual dysfunction, sleep disturbances, urinary incontinence, urinary urgency, vaginal discharge and vaginal itching. Diet regular.The patient states her exercise level is    . The patient's tobacco use is:  Social History    Tobacco Use  Smoking Status Every Day   Current packs/day: 1.00   Average packs/day: 1 pack/day for 25.0 years (25.0 ttl pk-yrs)   Types: Cigarettes  Smokeless Tobacco Never  Tobacco Comments   quit for six years, started back 09-2018 - currently on wellbutrin  for smoking cessation. 12-09-21 smoking 1/2 pack/day, 06/13/22 - down to 5-6 cigs/day, using patches 06/21/2023, down to 2 cigarettes/day  . She has been exposed to passive smoke. The patient's alcohol use is:  Social History   Substance and Sexual Activity  Alcohol Use Yes  . Additional information: Last pap ***, next one scheduled for ***.    Review of Systems   There were no vitals filed for this visit. There is no height or weight on file to calculate BMI.  Wt Readings from Last 3 Encounters:  12/14/23 198 lb (89.8 kg)  09/06/23 198 lb 3.2 oz (89.9 kg)  08/07/23 193 lb 6.4 oz (87.7 kg)     Objective:  Physical Exam      Assessment And Plan:     Encounter for annual health examination  Mixed hyperlipidemia  Acquired hypothyroidism     No follow-ups on file. Patient was given opportunity to ask questions. Patient verbalized understanding of the plan and was able to repeat key elements of the plan. All questions were answered to their satisfaction.   Susanna Epley, FNP  I, Susanna Epley, FNP, have reviewed all documentation for this visit. The documentation on 12/19/23 for the exam, diagnosis, procedures, and orders are all accurate and complete.

## 2023-12-20 ENCOUNTER — Ambulatory Visit (INDEPENDENT_AMBULATORY_CARE_PROVIDER_SITE_OTHER): Payer: Self-pay | Admitting: Nurse Practitioner

## 2023-12-20 ENCOUNTER — Encounter: Payer: Self-pay | Admitting: Nurse Practitioner

## 2023-12-20 VITALS — BP 120/80 | HR 95 | Temp 98.6°F | Ht 64.0 in | Wt 198.6 lb

## 2023-12-20 DIAGNOSIS — E039 Hypothyroidism, unspecified: Secondary | ICD-10-CM

## 2023-12-20 DIAGNOSIS — Z6834 Body mass index (BMI) 34.0-34.9, adult: Secondary | ICD-10-CM

## 2023-12-20 DIAGNOSIS — E66811 Other obesity due to excess calories: Secondary | ICD-10-CM

## 2023-12-20 DIAGNOSIS — Z2821 Immunization not carried out because of patient refusal: Secondary | ICD-10-CM

## 2023-12-20 DIAGNOSIS — E782 Mixed hyperlipidemia: Secondary | ICD-10-CM

## 2023-12-20 DIAGNOSIS — E6609 Other obesity due to excess calories: Secondary | ICD-10-CM | POA: Insufficient documentation

## 2023-12-20 DIAGNOSIS — Z79899 Other long term (current) drug therapy: Secondary | ICD-10-CM

## 2023-12-20 DIAGNOSIS — Z0000 Encounter for general adult medical examination without abnormal findings: Secondary | ICD-10-CM

## 2023-12-20 DIAGNOSIS — Z13228 Encounter for screening for other metabolic disorders: Secondary | ICD-10-CM | POA: Diagnosis not present

## 2023-12-20 DIAGNOSIS — E559 Vitamin D deficiency, unspecified: Secondary | ICD-10-CM

## 2023-12-20 DIAGNOSIS — I700 Atherosclerosis of aorta: Secondary | ICD-10-CM | POA: Diagnosis not present

## 2023-12-20 DIAGNOSIS — Z720 Tobacco use: Secondary | ICD-10-CM

## 2023-12-20 NOTE — Assessment & Plan Note (Signed)
 Will check vitamin D level and supplement as needed.    Also encouraged to spend 15 minutes in the sun daily.

## 2023-12-20 NOTE — Assessment & Plan Note (Signed)

## 2023-12-20 NOTE — Assessment & Plan Note (Signed)
 Diet controlled, continue focusing on diet low in fat. Continue statin, tolerating well.

## 2023-12-20 NOTE — Assessment & Plan Note (Signed)
 Continue statin, tolerating well

## 2023-12-20 NOTE — Assessment & Plan Note (Signed)
 Ordered low dose CT scan of lungs.  Smoking cessation instruction/counseling given:  counseled patient on the dangers of tobacco use, advised patient to stop smoking, and reviewed strategies to maximize success

## 2023-12-20 NOTE — Assessment & Plan Note (Signed)
 Behavior modifications discussed and diet history reviewed.   Pt will continue to exercise regularly and modify diet with low GI, plant based foods and decrease intake of processed foods.  Recommend intake of daily multivitamin, Vitamin D , and calcium .  Recommend mammogram (reports needing to make an appt) and colonoscopy for preventive screenings, as well as recommend immunizations that include influenza, TDAP, and Shingles (declined)

## 2023-12-20 NOTE — Assessment & Plan Note (Signed)
 She is encouraged to strive for BMI less than 30 to decrease cardiac risk. Advised to aim for at least 150 minutes of exercise per week.

## 2023-12-20 NOTE — Assessment & Plan Note (Signed)
 Declines shingrix, educated on disease process and is aware if he changes his mind to notify office She feels she just had shingles and was treated for shingles a few days ago, had one macular papular vesicle to the left side of her neck.

## 2023-12-20 NOTE — Assessment & Plan Note (Signed)
Controlled, continue current medications. Will check thyroid levels.

## 2023-12-21 LAB — CMP14+EGFR
ALT: 11 IU/L (ref 0–32)
AST: 12 IU/L (ref 0–40)
Albumin: 4.2 g/dL (ref 3.8–4.9)
Alkaline Phosphatase: 83 IU/L (ref 44–121)
BUN/Creatinine Ratio: 13 (ref 9–23)
BUN: 9 mg/dL (ref 6–24)
Bilirubin Total: <0.2 mg/dL (ref 0.0–1.2)
CO2: 26 mmol/L (ref 20–29)
Calcium: 9.4 mg/dL (ref 8.7–10.2)
Chloride: 99 mmol/L (ref 96–106)
Creatinine, Ser: 0.68 mg/dL (ref 0.57–1.00)
Globulin, Total: 2.9 g/dL (ref 1.5–4.5)
Glucose: 82 mg/dL (ref 70–99)
Potassium: 4.6 mmol/L (ref 3.5–5.2)
Sodium: 139 mmol/L (ref 134–144)
Total Protein: 7.1 g/dL (ref 6.0–8.5)
eGFR: 100 mL/min/{1.73_m2} (ref >59)

## 2023-12-21 LAB — LIPID PANEL
Chol/HDL Ratio: 3.5 ratio (ref 0.0–4.4)
Cholesterol, Total: 224 mg/dL — ABNORMAL HIGH (ref 100–199)
HDL: 64 mg/dL (ref >39)
LDL Chol Calc (NIH): 147 mg/dL — ABNORMAL HIGH (ref 0–99)
Triglycerides: 77 mg/dL (ref 0–149)
VLDL Cholesterol Cal: 13 mg/dL (ref 5–40)

## 2023-12-21 LAB — CBC WITH DIFFERENTIAL/PLATELET
Basophils Absolute: 0.1 10*3/uL (ref 0.0–0.2)
Basos: 1 %
EOS (ABSOLUTE): 0.1 10*3/uL (ref 0.0–0.4)
Eos: 1 %
Hematocrit: 38.1 % (ref 34.0–46.6)
Hemoglobin: 12.2 g/dL (ref 11.1–15.9)
Immature Grans (Abs): 0 10*3/uL (ref 0.0–0.1)
Immature Granulocytes: 0 %
Lymphocytes Absolute: 2.3 10*3/uL (ref 0.7–3.1)
Lymphs: 27 %
MCH: 26.5 pg — ABNORMAL LOW (ref 26.6–33.0)
MCHC: 32 g/dL (ref 31.5–35.7)
MCV: 83 fL (ref 79–97)
Monocytes Absolute: 0.6 10*3/uL (ref 0.1–0.9)
Monocytes: 7 %
Neutrophils Absolute: 5.4 10*3/uL (ref 1.4–7.0)
Neutrophils: 64 %
Platelets: 286 10*3/uL (ref 150–450)
RBC: 4.6 x10E6/uL (ref 3.77–5.28)
RDW: 16.1 % — ABNORMAL HIGH (ref 11.7–15.4)
WBC: 8.5 10*3/uL (ref 3.4–10.8)

## 2023-12-21 LAB — TSH+FREE T4
Free T4: 1.69 ng/dL (ref 0.82–1.77)
TSH: 4.64 u[IU]/mL — ABNORMAL HIGH (ref 0.450–4.500)

## 2023-12-21 LAB — HEMOGLOBIN A1C
Est. average glucose Bld gHb Est-mCnc: 120 mg/dL
Hgb A1c MFr Bld: 5.8 % — ABNORMAL HIGH (ref 4.8–5.6)

## 2023-12-25 DIAGNOSIS — G4733 Obstructive sleep apnea (adult) (pediatric): Secondary | ICD-10-CM | POA: Diagnosis not present

## 2023-12-27 DIAGNOSIS — M9901 Segmental and somatic dysfunction of cervical region: Secondary | ICD-10-CM | POA: Diagnosis not present

## 2023-12-27 DIAGNOSIS — M9903 Segmental and somatic dysfunction of lumbar region: Secondary | ICD-10-CM | POA: Diagnosis not present

## 2023-12-27 DIAGNOSIS — M9905 Segmental and somatic dysfunction of pelvic region: Secondary | ICD-10-CM | POA: Diagnosis not present

## 2023-12-27 DIAGNOSIS — M5432 Sciatica, left side: Secondary | ICD-10-CM | POA: Diagnosis not present

## 2024-01-03 DIAGNOSIS — M9901 Segmental and somatic dysfunction of cervical region: Secondary | ICD-10-CM | POA: Diagnosis not present

## 2024-01-03 DIAGNOSIS — M5432 Sciatica, left side: Secondary | ICD-10-CM | POA: Diagnosis not present

## 2024-01-03 DIAGNOSIS — M9903 Segmental and somatic dysfunction of lumbar region: Secondary | ICD-10-CM | POA: Diagnosis not present

## 2024-01-03 DIAGNOSIS — M9905 Segmental and somatic dysfunction of pelvic region: Secondary | ICD-10-CM | POA: Diagnosis not present

## 2024-01-03 NOTE — Progress Notes (Deleted)
 PATIENT: Dana Martin DOB: Jun 07, 1966  REASON FOR VISIT: follow up HISTORY FROM: patient  No chief complaint on file.    HISTORY OF PRESENT ILLNESS:  01/03/24 ALL:  MECHEL HAGGARD is a 58 y.o. female here today for follow up for OSA on CPAP. She was seen in consult with Dr Omar Bibber 08/2023 for concerns of snoring, excessive daytime sleepiness, and non restorative sleep. HST 09/2023 showed severe obstructive sleep apnea with a total AHI of 30.3/hour and O2 nadir of 55.4% with significant time below or at 88% saturation of over 90 minutes for the study, indicating nocturnal hypoxemia. AutoPAP advised. Since,     HISTORY: (copied from Dr Harding Li previous note)  Dear Dana Martin,     I saw your patient, Dana Martin, upon your kind request in my sleep clinic today for initial consultation of her sleep disorder, in particular, concern for underlying obstructive sleep apnea.  The patient is unaccompanied today.  As you know, Ms. Vickrey is a 58 year old female with an underlying medical history of rec. pancreatitis, smoking, COPD, hearing loss, reflux disease, hypothyroidism, PVCs, obesity, who reports snoring and excessive daytime somnolence, difficulty falling asleep at night and nonrestorative sleep.  Her Epworth sleepiness score is 6 out of 24, fatigue severity score is 45 out of 63.  She reports oxygen desaturations while asleep during her recent hospitalization.  She was hospitalized in December for pancreatitis.  She drinks alcohol occasionally, none since her last bout of pancreatitis.  She smokes about half a pack per day and is working on smoking cessation.  She drinks quite a bit of caffeine in the form of coffee, 3 to 4 cups/day, as late as 5 PM and half a serving to 1 serving of soda per day.  She works in Engineering geologist with variable hours.  Bedtime is generally between 11 and 1 AM and rise time between 7 and 7:30 AM.  She does have a TV on in her bedroom and it tends to stay on all night.  She  has occasional morning headaches, does not typically take any medication for this.  Her sister has sleep apnea.  Patient lives with her husband, they have a small dog in the household, the dog typically sleeps on the bed with them.   REVIEW OF SYSTEMS: Out of a complete 14 system review of symptoms, the patient complains only of the following symptoms, and all other reviewed systems are negative.  ESS:  ALLERGIES: Allergies  Allergen Reactions   Fish Allergy Hives   Gadolinium Derivatives Hives, Itching and Rash    Pt stated she felt itching and burning in her throat immediately following the injection.   Multihance  [Gadobenate] Hives, Itching and Rash    Itching and burning in her throat. Hives and itching of her skin.   Omnipaque  [Iohexol ] Hives   Other Nausea And Vomiting    ALL NARCOTICS   Peanut-Containing Drug Products Hives   Shellfish Allergy Hives   Codeine Nausea And Vomiting   Hydrocodone -Acetaminophen  Other (See Comments)   Oxycodone  Nausea And Vomiting   Tramadol      headaches    HOME MEDICATIONS: Outpatient Medications Prior to Visit  Medication Sig Dispense Refill   acyclovir  (ZOVIRAX ) 400 MG tablet Take 1 tablet (400 mg total) by mouth 4 (four) times daily. 50 tablet 0   atorvastatin  (LIPITOR) 10 MG tablet Take 1 tablet (10 mg total) by mouth daily. 30 tablet 3   Cholecalciferol (VITAMIN D3) 25 MCG (1000 UT) CAPS  Take 2 capsules by mouth daily. daily     diphenhydrAMINE  (BENADRYL ) 25 MG tablet Take 25-50 mg by mouth daily as needed for itching (allergic reaction).      famotidine (PEPCID) 10 MG tablet Take 10 mg by mouth daily.     gabapentin  (NEURONTIN ) 100 MG capsule Take 1 capsule (100 mg total) by mouth at bedtime. 30 capsule 2   ibuprofen  (ADVIL ) 200 MG tablet Take 400-800 mg by mouth every 6 (six) hours as needed for headache or moderate pain.     levocetirizine (XYZAL ) 5 MG tablet Take 5 mg by mouth at bedtime.     Polyethyl Glycol-Propyl Glycol 0.4-0.3 %  SOLN Place 1 drop into both eyes 5 (five) times daily as needed (dry eyes/irriation).     predniSONE  (STERAPRED UNI-PAK 21 TAB) 10 MG (21) TBPK tablet Take as directed 21 tablet 0   SYNTHROID  175 MCG tablet TAKE 1 TABLET BY MOUTH EVERY DAY 90 tablet 1   No facility-administered medications prior to visit.    PAST MEDICAL HISTORY: Past Medical History:  Diagnosis Date   Acute left-sided low back pain with left-sided sciatica 08/08/2023   Acute pancreatitis 12/06/2013   Adverse food reaction 09/04/2019   Angio-edema    COPD (chronic obstructive pulmonary disease) (HCC)    denies SOB with daily activities   Epistaxis, recurrent 10/07/2022   Full dentures    GERD (gastroesophageal reflux disease)    History of pancreatitis    Hypothyroidism    Hypoxia 07/26/2023   Nasal sinus congestion 05/08/2014   with sinus drainage   Other fatigue 08/08/2023   Pancreatitis    PVC (premature ventricular contraction) 07/07/2017   Stress incontinence    Symptomatic cholelithiasis 05/2014   Urticaria    Varicose veins of bilateral lower extremities with pain    Vitamin B12 deficiency 06/26/2019   Wears dentures    upper and lower   Wears glasses     PAST SURGICAL HISTORY: Past Surgical History:  Procedure Laterality Date   BREAST BIOPSY Right pt unsure   benign   BREAST EXCISIONAL BIOPSY Left pt unsure   benign   BREAST LUMPECTOMY     CHOLECYSTECTOMY N/A 05/13/2014   Procedure: LAPAROSCOPIC CHOLECYSTECTOMY WITH INTRAOPERATIVE CHOLANGIOGRAM;  Surgeon: Oza Blumenthal, MD;  Location: [facility];  Service: General;  Laterality: N/A;   ESOPHAGOGASTRODUODENOSCOPY (EGD) WITH PROPOFOL  N/A 08/23/2019   Procedure: ESOPHAGOGASTRODUODENOSCOPY (EGD) WITH PROPOFOL ;  Surgeon: Alvis Jourdain, MD;  Location: [facility];  Service: Endoscopy;  Laterality: N/A;   EUS N/A 02/19/2014   Procedure: ESOPHAGEAL ENDOSCOPIC ULTRASOUND (EUS) RADIAL;  Surgeon: Evangeline Hilts, MD;  Location: [facility];  Service: Endoscopy;  Laterality: N/A;   TUBAL LIGATION     UPPER ESOPHAGEAL ENDOSCOPIC ULTRASOUND (EUS) N/A 08/23/2019   Procedure: UPPER ESOPHAGEAL ENDOSCOPIC ULTRASOUND (EUS);  Surgeon: Alvis Jourdain, MD;  Location: [facility];  Service: Endoscopy;  Laterality: N/A;   VAGINAL HYSTERECTOMY  11/13/2001   transvaginal     FAMILY HISTORY: Family History  Problem Relation Age of Onset   Hypertension Mother    Atrial fibrillation Mother    Heart attack Mother    Hypertension Sister    Hypertension Brother    Hypertension Daughter    Lung cancer Brother    Diabetes Sister     SOCIAL HISTORY: Social History   Socioeconomic History   Marital status: Married    Spouse name: Not on file   Number of children: Not on file   Years  of education: Not on file   Highest education level: 12th grade  Occupational History   Not on file  Tobacco Use   Smoking status: Every Day    Current packs/day: 1.00    Average packs/day: 1 pack/day for 25.0 years (25.0 ttl pk-yrs)    Types: Cigarettes   Smokeless tobacco: Never   Tobacco comments:    quit for six years, started back 09-2018 - currently on wellbutrin  for smoking cessation. 12-09-21 smoking 1/2 pack/day, 06/13/22 - down to 5-6 cigs/day, using patches 06/21/2023, down to 2 cigarettes/day  Substance and Sexual Activity   Alcohol use: Yes   Drug use: No   Sexual activity: Yes  Other Topics Concern   Not on file  Social History Narrative   Not on file   Social Drivers of Health   Financial Resource Strain: Low Risk  (06/21/2023)   Overall Financial Resource Strain (CARDIA)    Difficulty of Paying Living Expenses: Not very hard  Food Insecurity: No Food Insecurity (07/31/2023)   Hunger Vital Sign    Worried About Running Out of Food in the Last Year: Never true    Ran Out of Food in the Last Year: Never true  Transportation Needs: No Transportation Needs (07/31/2023)   PRAPARE - Administrator, Civil Service  (Medical): No    Lack of Transportation (Non-Medical): No  Physical Activity: Unknown (06/21/2023)   Exercise Vital Sign    Days of Exercise per Week: Patient declined    Minutes of Exercise per Session: Not on file  Stress: Stress Concern Present (06/21/2023)   [employer] of Occupational Health - Occupational Stress Questionnaire    Feeling of Stress : To some extent  Social Connections: Unknown (06/21/2023)   Social Connection and Isolation Panel [NHANES]    Frequency of Communication with Friends and Family: Once a week    Frequency of Social Gatherings with Friends and Family: Not on file    Attends Religious Services: Patient declined    Active Member of Clubs or Organizations: No    Attends Banker Meetings: Not on file    Marital Status: Married  Intimate Partner Violence: Not At Risk (07/31/2023)   Humiliation, Afraid, Rape, and Kick questionnaire    Fear of Current or Ex-Partner: No    Emotionally Abused: No    Physically Abused: No    Sexually Abused: No     PHYSICAL EXAM  There were no vitals filed for this visit. There is no height or weight on file to calculate BMI.  Generalized: Well developed, in no acute distress  Cardiology: normal rate and rhythm, no murmur noted Respiratory: clear to auscultation bilaterally  Neurological examination  Mentation: Alert oriented to time, place, history taking. Follows all commands speech and language fluent Cranial nerve II-XII: Pupils were equal round reactive to light. Extraocular movements were full, visual field were full  Motor: The motor testing reveals 5 over 5 strength of all 4 extremities. Good symmetric motor tone is noted throughout.  Gait and station: Gait is normal.    DIAGNOSTIC DATA (LABS, IMAGING, TESTING) - I reviewed patient records, labs, notes, testing and imaging myself where available.      No data to display           Lab Results  Component Value Date   WBC 8.5 12/20/2023    HGB 12.2 12/20/2023   HCT 38.1 12/20/2023   MCV 83 12/20/2023   PLT 286 12/20/2023  Component Value Date/Time   NA 139 12/20/2023 0936   K 4.6 12/20/2023 0936   CL 99 12/20/2023 0936   CO2 26 12/20/2023 0936   GLUCOSE 82 12/20/2023 0936   GLUCOSE 90 12/14/2023 1203   BUN 9 12/20/2023 0936   CREATININE 0.68 12/20/2023 0936   CREATININE 0.60 03/12/2015 1021   CALCIUM  9.4 12/20/2023 0936   PROT 7.1 12/20/2023 0936   ALBUMIN 4.2 12/20/2023 0936   AST 12 12/20/2023 0936   ALT 11 12/20/2023 0936   ALKPHOS 83 12/20/2023 0936   BILITOT <0.2 12/20/2023 0936   GFRNONAA >60 12/14/2023 1203   GFRNONAA >89 03/12/2015 1021   GFRAA 124 12/04/2019 1109   GFRAA >89 03/12/2015 1021   Lab Results  Component Value Date   CHOL 224 (H) 12/20/2023   HDL 64 12/20/2023   LDLCALC 147 (H) 12/20/2023   TRIG 77 12/20/2023   CHOLHDL 3.5 12/20/2023   Lab Results  Component Value Date   HGBA1C 5.8 (H) 12/20/2023   Lab Results  Component Value Date   VITAMINB12 341 12/19/2022   Lab Results  Component Value Date   TSH 4.640 (H) 12/20/2023     ASSESSMENT AND PLAN 58 y.o. year old female  has a past medical history of Acute left-sided low back pain with left-sided sciatica (08/08/2023), Acute pancreatitis (12/06/2013), Adverse food reaction (09/04/2019), Angio-edema, COPD (chronic obstructive pulmonary disease) (HCC), Epistaxis, recurrent (10/07/2022), Full dentures, GERD (gastroesophageal reflux disease), History of pancreatitis, Hypothyroidism, Hypoxia (07/26/2023), Nasal sinus congestion (05/08/2014), Other fatigue (08/08/2023), Pancreatitis, PVC (premature ventricular contraction) (07/07/2017), Stress incontinence, Symptomatic cholelithiasis (05/2014), Urticaria, Varicose veins of bilateral lower extremities with pain, Vitamin B12 deficiency (06/26/2019), Wears dentures, and Wears glasses. here with   No diagnosis found.    Maicy L Trabert is doing well on CPAP therapy. Compliance  report reveals ***. *** was encouraged to continue using CPAP nightly and for greater than 4 hours each night. We will update supply orders as indicated. Risks of untreated sleep apnea review and education materials provided. Healthy lifestyle habits encouraged. *** will follow up in ***, sooner if needed. *** verbalizes understanding and agreement with this plan.    No orders of the defined types were placed in this encounter.    No orders of the defined types were placed in this encounter.     Terrilyn Fick, FNP-C 01/03/2024, 8:13 AM [facility] [address], [address] [place], [place] [address] [phone]

## 2024-01-03 NOTE — Patient Instructions (Incomplete)

## 2024-01-04 ENCOUNTER — Ambulatory Visit: Admitting: Family Medicine

## 2024-01-07 ENCOUNTER — Other Ambulatory Visit: Payer: Self-pay | Admitting: Nurse Practitioner

## 2024-01-09 ENCOUNTER — Inpatient Hospital Stay: Admission: RE | Admit: 2024-01-09 | Source: Ambulatory Visit

## 2024-01-11 ENCOUNTER — Ambulatory Visit
Admission: RE | Admit: 2024-01-11 | Discharge: 2024-01-11 | Disposition: A | Discharge status: Home / Self Care | Source: Ambulatory Visit | Attending: Nurse Practitioner | Admitting: Nurse Practitioner

## 2024-01-11 DIAGNOSIS — F17210 Nicotine dependence, cigarettes, uncomplicated: Secondary | ICD-10-CM | POA: Diagnosis not present

## 2024-01-11 DIAGNOSIS — Z122 Encounter for screening for malignant neoplasm of respiratory organs: Secondary | ICD-10-CM | POA: Diagnosis not present

## 2024-01-11 DIAGNOSIS — Z720 Tobacco use: Secondary | ICD-10-CM

## 2024-01-11 NOTE — Progress Notes (Signed)
 PATIENT: Dana Martin DOB: Dec 08, 1965  REASON FOR VISIT: follow up HISTORY FROM: patient  Chief Complaint  Patient presents with   RM1/CPAP    Pt is here Alone. Pt states that she hasn't been using her CPAP Machine. Pt states that she had a sinus infection as well as sciatic never issues and was unable to use her  Machine. Pt states that she would have a dry mouth when she would use her machine.      HISTORY OF PRESENT ILLNESS:  01/16/24 ALL:  Dana Martin is a 58 y.o. female here today for follow up for OSA on CPAP. She was seen in consult with Dr Omar Bibber 08/2023 for concerns of snoring, excessive daytime sleepiness, and non restorative sleep. HST 09/2023 showed severe obstructive sleep apnea with a total AHI of 30.3/hour and O2 nadir of 55.4% with significant time below or at 88% saturation of over 90 minutes for the study, indicating nocturnal hypoxemia. AutoPAP advised.    Since, she reports not being able to use her machine much at all. She has had significant back pain preventing restful sleep. She is working with PT and receiving steroid injection to help manage pain. She is considering surgical options. She reports significant dry mouth. She has tried a nasal pillow but is a mouth breather and switched to a modified FFM. She tells me that her humidity setting on the machine is disabled and she is not able to adjust. She has used Biotene and is keeping water at bedside. She endorses desire to continue working on compliance.    HISTORY: (copied from Dr Omar Bibber previous note)  Dear Abelino Able,     I saw your patient, Dana Martin, upon your kind request in my sleep clinic today for initial consultation of her sleep disorder, in particular, concern for underlying obstructive sleep apnea.  The patient is unaccompanied today.  As you know, [name] is a 58 year old female with an underlying medical history of rec. pancreatitis, smoking, COPD, hearing loss, reflux disease,  hypothyroidism, PVCs, obesity, who reports snoring and excessive daytime somnolence, difficulty falling asleep at night and nonrestorative sleep.  Her Epworth sleepiness score is 6 out of 24, fatigue severity score is 45 out of 63.  She reports oxygen desaturations while asleep during her recent hospitalization.  She was hospitalized in December for pancreatitis.  She drinks alcohol occasionally, none since her last bout of pancreatitis.  She smokes about half a pack per day and is working on smoking cessation.  She drinks quite a bit of caffeine in the form of coffee, 3 to 4 cups/day, as late as 5 PM and half a serving to 1 serving of soda per day.  She works in Engineering geologist with variable hours.  Bedtime is generally between 11 and 1 AM and rise time between 7 and 7:30 AM.  She does have a TV on in her bedroom and it tends to stay on all night.  She has occasional morning headaches, does not typically take any medication for this.  Her sister has sleep apnea.  Patient lives with her husband, they have a small dog in the household, the dog typically sleeps on the bed with them.   REVIEW OF SYSTEMS: Out of a complete 14 system review of symptoms, the patient complains only of the following symptoms, back pain, dry mouth and all other reviewed systems are negative.  ESS: 5/24  ALLERGIES: Allergies  Allergen Reactions   Fish Allergy Hives  Gadolinium Derivatives Hives, Itching and Rash    Pt stated she felt itching and burning in her throat immediately following the injection.   Multihance  [Gadobenate] Hives, Itching and Rash    Itching and burning in her throat. Hives and itching of her skin.   Omnipaque  [Iohexol ] Hives   Other Nausea And Vomiting    ALL NARCOTICS   Peanut-Containing Drug Products Hives   Shellfish Allergy Hives   Codeine Nausea And Vomiting   Hydrocodone -Acetaminophen  Other (See Comments)   Oxycodone  Nausea And Vomiting   Tramadol      headaches    HOME MEDICATIONS: Outpatient  Medications Prior to Visit  Medication Sig Dispense Refill   atorvastatin  (LIPITOR) 10 MG tablet Take 1 tablet by mouth daily.     Cholecalciferol (VITAMIN D3) 25 MCG (1000 UT) CAPS Take 2 capsules by mouth daily. daily     diphenhydrAMINE  (BENADRYL ) 25 MG tablet Take 25-50 mg by mouth daily as needed for itching (allergic reaction).      famotidine (PEPCID) 10 MG tablet Take 10 mg by mouth daily.     ibuprofen  (ADVIL ) 200 MG tablet Take 400-800 mg by mouth every 6 (six) hours as needed for headache or moderate pain.     levocetirizine (XYZAL ) 5 MG tablet Take 5 mg by mouth at bedtime.     Polyethyl Glycol-Propyl Glycol 0.4-0.3 % SOLN Place 1 drop into both eyes 5 (five) times daily as needed (dry eyes/irriation).     SYNTHROID  175 MCG tablet TAKE 1 TABLET BY MOUTH EVERY DAY 90 tablet 1   acyclovir  (ZOVIRAX ) 400 MG tablet Take 1 tablet (400 mg total) by mouth 4 (four) times daily. (Patient not taking: Reported on 01/16/2024) 50 tablet 0   albuterol  (VENTOLIN  HFA) 108 (90 Base) MCG/ACT inhaler INHALE 2 PUFFS BY MOUTH EVERY 4 TO 6 HOURS AS NEEDED (Patient not taking: Reported on 01/16/2024)     atorvastatin  (LIPITOR) 10 MG tablet Take 1 tablet (10 mg total) by mouth daily. 30 tablet 3   diazepam (VALIUM) 2 MG tablet Take 2 mg by mouth. (Patient not taking: Reported on 01/16/2024)     gabapentin  (NEURONTIN ) 100 MG capsule Take 1 capsule (100 mg total) by mouth at bedtime. 30 capsule 2   ipratropium (ATROVENT) 0.03 % nasal spray Place 2 sprays into both nostrils 3 (three) times daily. (Patient not taking: Reported on 01/16/2024)     meloxicam (MOBIC) 15 MG tablet Take 1 tablet by mouth daily. (Patient not taking: Reported on 01/16/2024)     predniSONE  (STERAPRED UNI-PAK 21 TAB) 10 MG (21) TBPK tablet Take as directed (Patient not taking: Reported on 01/16/2024) 21 tablet 0   tiZANidine (ZANAFLEX) 2 MG tablet Take 1 tablet by mouth every 8 (eight) hours. (Patient not taking: Reported on 01/16/2024)     No  facility-administered medications prior to visit.    PAST MEDICAL HISTORY: Past Medical History:  Diagnosis Date   Acute left-sided low back pain with left-sided sciatica 08/08/2023   Acute pancreatitis 12/06/2013   Adverse food reaction 09/04/2019   Angio-edema    COPD (chronic obstructive pulmonary disease) (HCC)    denies SOB with daily activities   Epistaxis, recurrent 10/07/2022   Full dentures    GERD (gastroesophageal reflux disease)    History of pancreatitis    Hypothyroidism    Hypoxia 07/26/2023   Nasal sinus congestion 05/08/2014   with sinus drainage   Other fatigue 08/08/2023   Pancreatitis    PVC (premature ventricular contraction) 07/07/2017  Stress incontinence    Symptomatic cholelithiasis 05/2014   Urticaria    Varicose veins of bilateral lower extremities with pain    Vitamin B12 deficiency 06/26/2019   Wears dentures    upper and lower   Wears glasses     PAST SURGICAL HISTORY: Past Surgical History:  Procedure Laterality Date   BREAST BIOPSY Right pt unsure   benign   BREAST EXCISIONAL BIOPSY Left pt unsure   benign   BREAST LUMPECTOMY     CHOLECYSTECTOMY N/A 05/13/2014   Procedure: LAPAROSCOPIC CHOLECYSTECTOMY WITH INTRAOPERATIVE CHOLANGIOGRAM;  Surgeon: Oza Blumenthal, MD;  Location: [facility];  Service: General;  Laterality: N/A;   ESOPHAGOGASTRODUODENOSCOPY (EGD) WITH PROPOFOL  N/A 08/23/2019   Procedure: ESOPHAGOGASTRODUODENOSCOPY (EGD) WITH PROPOFOL ;  Surgeon: Alvis Jourdain, MD;  Location: [facility];  Service: Endoscopy;  Laterality: N/A;   EUS N/A 02/19/2014   Procedure: ESOPHAGEAL ENDOSCOPIC ULTRASOUND (EUS) RADIAL;  Surgeon: Evangeline Hilts, MD;  Location: [facility];  Service: Endoscopy;  Laterality: N/A;   TUBAL LIGATION     UPPER ESOPHAGEAL ENDOSCOPIC ULTRASOUND (EUS) N/A 08/23/2019   Procedure: UPPER ESOPHAGEAL ENDOSCOPIC ULTRASOUND (EUS);  Surgeon: Alvis Jourdain, MD;  Location: [facility];  Service: Endoscopy;   Laterality: N/A;   VAGINAL HYSTERECTOMY  11/13/2001   transvaginal     FAMILY HISTORY: Family History  Problem Relation Age of Onset   Hypertension Mother    Atrial fibrillation Mother    Heart attack Mother    Hypertension Sister    Hypertension Brother    Hypertension Daughter    Lung cancer Brother    Diabetes Sister     SOCIAL HISTORY: Social History   Socioeconomic History   Marital status: Married    Spouse name: Not on file   Number of children: Not on file   Years of education: Not on file   Highest education level: 12th grade  Occupational History   Not on file  Tobacco Use   Smoking status: Every Day    Current packs/day: 1.00    Average packs/day: 1 pack/day for 25.0 years (25.0 ttl pk-yrs)    Types: Cigarettes   Smokeless tobacco: Never   Tobacco comments:    quit for six years, started back 09-2018 - currently on wellbutrin  for smoking cessation. 12-09-21 smoking 1/2 pack/day, 06/13/22 - down to 5-6 cigs/day, using patches 06/21/2023, down to 2 cigarettes/day  Substance and Sexual Activity   Alcohol use: Yes   Drug use: No   Sexual activity: Yes  Other Topics Concern   Not on file  Social History Narrative   Not on file   Social Drivers of Health   Financial Resource Strain: Low Risk  (06/21/2023)   Overall Financial Resource Strain (CARDIA)    Difficulty of Paying Living Expenses: Not very hard  Food Insecurity: No Food Insecurity (07/31/2023)   Hunger Vital Sign    Worried About Running Out of Food in the Last Year: Never true    Ran Out of Food in the Last Year: Never true  Transportation Needs: No Transportation Needs (07/31/2023)   PRAPARE - Administrator, Civil Service (Medical): No    Lack of Transportation (Non-Medical): No  Physical Activity: Unknown (06/21/2023)   Exercise Vital Sign    Days of Exercise per Week: Patient declined    Minutes of Exercise per Session: Not on file  Stress: Stress Concern Present (06/21/2023)    [employer] of Occupational Health - Occupational Stress Questionnaire    Feeling  of Stress : To some extent  Social Connections: Unknown (06/21/2023)   Social Connection and Isolation Panel [NHANES]    Frequency of Communication with Friends and Family: Once a week    Frequency of Social Gatherings with Friends and Family: Not on file    Attends Religious Services: Patient declined    Active Member of Clubs or Organizations: No    Attends Banker Meetings: Not on file    Marital Status: Married  Intimate Partner Violence: Not At Risk (07/31/2023)   Humiliation, Afraid, Rape, and Kick questionnaire    Fear of Current or Ex-Partner: No    Emotionally Abused: No    Physically Abused: No    Sexually Abused: No     PHYSICAL EXAM  Vitals:   01/16/24 0907  Weight: 203 lb 8 oz (92.3 kg)  Height: 5\' 4"  (1.626 m)   Body mass index is 34.93 kg/m.  Generalized: Well developed, in no acute distress  Cardiology: normal rate and rhythm, no murmur noted Respiratory: clear to auscultation bilaterally  Neurological examination  Mentation: Alert oriented to time, place, history taking. Follows all commands speech and language fluent Cranial nerve II-XII: Pupils were equal round reactive to light. Extraocular movements were full, visual field were full  Motor: The motor testing reveals 5 over 5 strength of all 4 extremities. Good symmetric motor tone is noted throughout.  Gait and station: Gait is normal.    DIAGNOSTIC DATA (LABS, IMAGING, TESTING) - I reviewed patient records, labs, notes, testing and imaging myself where available.      No data to display           Lab Results  Component Value Date   WBC 8.5 12/20/2023   HGB 12.2 12/20/2023   HCT 38.1 12/20/2023   MCV 83 12/20/2023   PLT 286 12/20/2023      Component Value Date/Time   NA 139 12/20/2023 0936   K 4.6 12/20/2023 0936   CL 99 12/20/2023 0936   CO2 26 12/20/2023 0936   GLUCOSE 82 12/20/2023  0936   GLUCOSE 90 12/14/2023 1203   BUN 9 12/20/2023 0936   CREATININE 0.68 12/20/2023 0936   CREATININE 0.60 03/12/2015 1021   CALCIUM  9.4 12/20/2023 0936   PROT 7.1 12/20/2023 0936   ALBUMIN 4.2 12/20/2023 0936   AST 12 12/20/2023 0936   ALT 11 12/20/2023 0936   ALKPHOS 83 12/20/2023 0936   BILITOT <0.2 12/20/2023 0936   GFRNONAA >60 12/14/2023 1203   GFRNONAA >89 03/12/2015 1021   GFRAA 124 12/04/2019 1109   GFRAA >89 03/12/2015 1021   Lab Results  Component Value Date   CHOL 224 (H) 12/20/2023   HDL 64 12/20/2023   LDLCALC 147 (H) 12/20/2023   TRIG 77 12/20/2023   CHOLHDL 3.5 12/20/2023   Lab Results  Component Value Date   HGBA1C 5.8 (H) 12/20/2023   Lab Results  Component Value Date   VITAMINB12 341 12/19/2022   Lab Results  Component Value Date   TSH 4.640 (H) 12/20/2023     ASSESSMENT AND PLAN 58 y.o. year old female  has a past medical history of Acute left-sided low back pain with left-sided sciatica (08/08/2023), Acute pancreatitis (12/06/2013), Adverse food reaction (09/04/2019), Angio-edema, COPD (chronic obstructive pulmonary disease) (HCC), Epistaxis, recurrent (10/07/2022), Full dentures, GERD (gastroesophageal reflux disease), History of pancreatitis, Hypothyroidism, Hypoxia (07/26/2023), Nasal sinus congestion (05/08/2014), Other fatigue (08/08/2023), Pancreatitis, PVC (premature ventricular contraction) (07/07/2017), Stress incontinence, Symptomatic cholelithiasis (05/2014), Urticaria, Varicose veins of  bilateral lower extremities with pain, Vitamin B12 deficiency (06/26/2019), Wears dentures, and Wears glasses. here with     ICD-10-CM   1. OSA on CPAP  G47.33 For home use only DME continuous positive airway pressure (CPAP)       Dana Martin is doing well on CPAP therapy. Compliance report reveals sub optimal compliance. She does endorse willingness to continue using therapy. We have reviewed HST results and discussed possible treatment options.  She was encouraged to continue using CPAP nightly and for greater than 4 hours each night. I will ask DME to assess mask options and evaluate proper functioning of humidity settings. Risks of untreated sleep apnea review and education materials provided. Healthy lifestyle habits encouraged. She will follow up in 3-4 months, sooner if needed. She verbalizes understanding and agreement with this plan.    Orders Placed This Encounter  Procedures   For home use only DME continuous positive airway pressure (CPAP)    Mask refitting. Liked nasal pillow but needs a chin strap. Having difficulty adjusting humidity. She reports humidity "is disabled."    Length of Need:   Lifetime    Patient has OSA or probable OSA:   Yes    Is the patient currently using CPAP in the home:   Yes    Settings:   Other see comments    CPAP supplies needed:   Mask, headgear, cushions, filters, heated tubing and water chamber     No orders of the defined types were placed in this encounter.   I personally spent a total of 30 minutes in the care of the patient today including preparing to see the patient, getting/reviewing separately obtained history, performing a medically appropriate exam/evaluation, counseling and educating, placing orders, referring and communicating with other health care professionals, documenting clinical information in the EHR, communicating results, and coordinating care.   Terrilyn Fick, FNP-C 01/16/2024, 10:01 AM [facility] [address], [address] [place], [place] [address] [phone]

## 2024-01-11 NOTE — Patient Instructions (Addendum)
 Please continue using your CPAP regularly. While your insurance requires that you use CPAP at least 4 hours each night on 70% of the nights, I recommend, that you not skip any nights and use it throughout the night if you can. Getting used to CPAP and staying with the treatment long term does take time and patience and discipline. Untreated obstructive sleep apnea when it is moderate to severe can have an adverse impact on cardiovascular health and raise her risk for heart disease, arrhythmias, hypertension, congestive heart failure, stroke and diabetes. Untreated obstructive sleep apnea causes sleep disruption, nonrestorative sleep, and sleep deprivation. This can have an impact on your day to day functioning and cause daytime sleepiness and impairment of cognitive function, memory loss, mood disturbance, and problems focussing. Using CPAP regularly can improve these symptoms.  We will ask the DME to help you adjust humidity settings and ensure proper functioning. Please make sure to use your machine every night. Let me know if you need me.   Follow up in 3-4 months

## 2024-01-15 ENCOUNTER — Telehealth: Payer: Self-pay

## 2024-01-15 NOTE — Telephone Encounter (Signed)
 Called pt and asked her to bring her CPAP Machine with her to her appointment tomorrow.

## 2024-01-16 ENCOUNTER — Encounter: Payer: Self-pay | Admitting: Family Medicine

## 2024-01-16 ENCOUNTER — Ambulatory Visit: Admitting: Family Medicine

## 2024-01-16 VITALS — Ht 64.0 in | Wt 203.5 lb

## 2024-01-16 DIAGNOSIS — G4733 Obstructive sleep apnea (adult) (pediatric): Secondary | ICD-10-CM | POA: Diagnosis not present

## 2024-01-16 NOTE — Addendum Note (Signed)
 Addended by: Terrilyn Fick L on: 01/16/2024 02:34 PM   Modules accepted: Level of Service

## 2024-01-17 DIAGNOSIS — M9903 Segmental and somatic dysfunction of lumbar region: Secondary | ICD-10-CM | POA: Diagnosis not present

## 2024-01-17 DIAGNOSIS — M9901 Segmental and somatic dysfunction of cervical region: Secondary | ICD-10-CM | POA: Diagnosis not present

## 2024-01-17 DIAGNOSIS — M5432 Sciatica, left side: Secondary | ICD-10-CM | POA: Diagnosis not present

## 2024-01-17 DIAGNOSIS — M9905 Segmental and somatic dysfunction of pelvic region: Secondary | ICD-10-CM | POA: Diagnosis not present

## 2024-01-30 ENCOUNTER — Ambulatory Visit: Payer: Self-pay | Admitting: Nurse Practitioner

## 2024-03-18 DIAGNOSIS — M5416 Radiculopathy, lumbar region: Secondary | ICD-10-CM | POA: Diagnosis not present

## 2024-04-03 DIAGNOSIS — M5416 Radiculopathy, lumbar region: Secondary | ICD-10-CM | POA: Diagnosis not present

## 2024-04-19 DIAGNOSIS — M5416 Radiculopathy, lumbar region: Secondary | ICD-10-CM | POA: Diagnosis not present

## 2024-06-25 ENCOUNTER — Ambulatory Visit: Payer: Self-pay | Admitting: Nurse Practitioner

## 2024-06-25 ENCOUNTER — Encounter: Payer: Self-pay | Admitting: Nurse Practitioner

## 2024-06-25 VITALS — BP 130/70 | HR 83 | Temp 98.5°F | Ht 64.0 in | Wt 200.0 lb

## 2024-06-25 DIAGNOSIS — Z2821 Immunization not carried out because of patient refusal: Secondary | ICD-10-CM

## 2024-06-25 DIAGNOSIS — E039 Hypothyroidism, unspecified: Secondary | ICD-10-CM | POA: Diagnosis not present

## 2024-06-25 DIAGNOSIS — I2510 Atherosclerotic heart disease of native coronary artery without angina pectoris: Secondary | ICD-10-CM | POA: Diagnosis not present

## 2024-06-25 DIAGNOSIS — E559 Vitamin D deficiency, unspecified: Secondary | ICD-10-CM | POA: Diagnosis not present

## 2024-06-25 DIAGNOSIS — I2721 Secondary pulmonary arterial hypertension: Secondary | ICD-10-CM

## 2024-06-25 DIAGNOSIS — Z79899 Other long term (current) drug therapy: Secondary | ICD-10-CM | POA: Diagnosis not present

## 2024-06-25 DIAGNOSIS — E782 Mixed hyperlipidemia: Secondary | ICD-10-CM

## 2024-06-25 DIAGNOSIS — L84 Corns and callosities: Secondary | ICD-10-CM | POA: Insufficient documentation

## 2024-06-25 MED ORDER — ATORVASTATIN CALCIUM 10 MG PO TABS: 10.0000 mg | ORAL_TABLET | Freq: Every day | ORAL | 1 refills | Status: DC

## 2024-06-25 NOTE — Assessment & Plan Note (Addendum)
 Well-managed with Synthroid . - Continue Synthroid  175 MCG oral daily.

## 2024-06-25 NOTE — Progress Notes (Signed)
 LILLETTE Kristeen JINNY Gladis, CMA,acting as a neurosurgeon for Gaines Ada, FNP.,have documented all relevant documentation on the behalf of Gaines Ada, FNP,as directed by  Gaines Ada, FNP while in the presence of Gaines Ada, FNP.  Subjective:  Patient ID: Siegfried LITTIE Paschal , female    DOB: 1966-06-23 , 58 y.o.   MRN: [id number]  Chief Complaint  Patient presents with   Hyperlipidemia    Patient presents today for a chol and tsh follow up, Patient reports compliance with medication. Patient denies any chest pain, SOB, or headaches. Patient has no concerns today.     HPI  Discussed the use of AI scribe software for clinical note transcription with the patient, who gave verbal consent to proceed.  History of Present Illness ROMAYNE TICAS is a 58 year old female who presents for a follow-up visit.  She has not seen any other healthcare providers since her last visit, except for a visit to [facility] for back pain, where she received cortisone injections approximately three months ago. She continues to experience symptoms of a pinched nerve causing sciatica.  She has difficulty remembering to take her cholesterol medication, atorvastatin , due to her irregular work hours. She typically tries to take it with dinner but sometimes forgets. She recently refilled her Synthroid  and takes it first thing in the morning.  She describes a painful area on her foot that has been present for about a year. It sometimes feels like a callus or a wart and causes discomfort when walking, especially in certain shoes. She has not consulted a podiatrist for this issue since it developed after her last visit to one.  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  Past Medical History:  Diagnosis Date   Acute left-sided low back pain with left-sided sciatica 08/08/2023   Acute pancreatitis 12/06/2013   Adverse food  reaction 09/04/2019   Angio-edema    COPD (chronic obstructive pulmonary disease) (HCC)    denies SOB with daily activities   Epistaxis, recurrent 10/07/2022   Full dentures    GERD (gastroesophageal reflux disease)    History of pancreatitis    Hypothyroidism    Hypoxia 07/26/2023   Nasal sinus congestion 05/08/2014   with sinus drainage   Other fatigue 08/08/2023   Pancreatitis    PVC (premature ventricular contraction) 07/07/2017   Stress incontinence    Symptomatic cholelithiasis 05/2014   Urticaria    Varicose veins of bilateral lower extremities with pain    Vitamin B12 deficiency 06/26/2019   Wears dentures    upper and lower   Wears glasses      Family History  Problem Relation Age of Onset   Hypertension Mother    Atrial fibrillation Mother    Heart attack Mother    Hypertension Sister    Hypertension Brother    Hypertension Daughter    Lung cancer Brother    Diabetes Sister      Current Outpatient Medications:    Cholecalciferol (VITAMIN D3) 25 MCG (1000 UT) CAPS, Take 2 capsules by mouth daily. daily, Disp: , Rfl:    diphenhydrAMINE  (BENADRYL ) 25 MG tablet, Take 25-50 mg by mouth daily as needed for itching (allergic reaction). , Disp: , Rfl:    famotidine (PEPCID) 10 MG tablet, Take 10 mg by mouth daily., Disp: , Rfl:    ibuprofen  (ADVIL ) 200 MG tablet, Take 400-800 mg by mouth every 6 (  six) hours as needed for headache or moderate pain., Disp: , Rfl:    levocetirizine (XYZAL ) 5 MG tablet, Take 5 mg by mouth at bedtime., Disp: , Rfl:    meloxicam (MOBIC) 15 MG tablet, Take 1 tablet by mouth daily., Disp: , Rfl:    Polyethyl Glycol-Propyl Glycol 0.4-0.3 % SOLN, Place 1 drop into both eyes 5 (five) times daily as needed (dry eyes/irriation)., Disp: , Rfl:    SYNTHROID  175 MCG tablet, TAKE 1 TABLET BY MOUTH EVERY DAY, Disp: 90 tablet, Rfl: 1   acyclovir  (ZOVIRAX ) 400 MG tablet, Take 1 tablet (400 mg total) by mouth 4 (four) times daily. (Patient not taking:  Reported on 06/25/2024), Disp: 50 tablet, Rfl: 0   albuterol  (VENTOLIN  HFA) 108 (90 Base) MCG/ACT inhaler, INHALE 2 PUFFS BY MOUTH EVERY 4 TO 6 HOURS AS NEEDED (Patient not taking: Reported on 06/25/2024), Disp: , Rfl:    atorvastatin  (LIPITOR) 10 MG tablet, Take 1 tablet (10 mg total) by mouth daily., Disp: 90 tablet, Rfl: 1   Allergies  Allergen Reactions   Fish Allergy Hives   Gadolinium Derivatives Hives, Itching and Rash    Pt stated she felt itching and burning in her throat immediately following the injection.   Multihance  [Gadobenate] Hives, Itching and Rash    Itching and burning in her throat. Hives and itching of her skin.   Omnipaque  [Iohexol ] Hives   Other Nausea And Vomiting    ALL NARCOTICS   Peanut-Containing Drug Products Hives   Shellfish Allergy Hives   Codeine Nausea And Vomiting   Hydrocodone -Acetaminophen  Other (See Comments)   Oxycodone  Nausea And Vomiting   Tramadol      headaches     Review of Systems  Constitutional: Negative.   Respiratory: Negative.    Cardiovascular: Negative.   Gastrointestinal: Negative.   Musculoskeletal:  Neck stiffness: left plantar surface of foot.  Neurological: Negative.      Today's Vitals   06/25/24 0825  BP: 130/70  Pulse: 83  Temp: 98.5 F (36.9 C)  TempSrc: Oral  Weight: 200 lb (90.7 kg)  Height: 5' 4 (1.626 m)  PainSc: 0-No pain   Body mass index is 34.33 kg/m.  Wt Readings from Last 3 Encounters:  06/25/24 200 lb (90.7 kg)  01/16/24 203 lb 8 oz (92.3 kg)  12/20/23 198 lb 9.6 oz (90.1 kg)      Objective:  Physical Exam Vitals and nursing note reviewed.  Constitutional:      General: She is not in acute distress.    Appearance: Normal appearance. She is obese.  Cardiovascular:     Rate and Rhythm: Normal rate and regular rhythm.     Pulses: Normal pulses.     Heart sounds: Normal heart sounds. No murmur heard. Pulmonary:     Effort: Pulmonary effort is normal. No respiratory distress.     Breath  sounds: Normal breath sounds. No wheezing.  Skin:    General: Skin is warm and dry.     Comments: Left foot plantar surface with calloused area.   Neurological:     General: No focal deficit present.     Mental Status: She is alert and oriented to person, place, and time.     Cranial Nerves: No cranial nerve deficit.     Motor: No weakness.  Psychiatric:        Mood and Affect: Mood normal.        Behavior: Behavior normal.        Thought Content:  Thought content normal.        Judgment: Judgment normal.      Assessment And Plan:   Assessment & Plan Mixed hyperlipidemia Managed with atorvastatin . She reports difficulty with consistent dosing due to irregular work hours. - Continue atorvastatin  10 MG oral daily. - Advised setting a timer or alarm to remember to take atorvastatin , preferably in the evening. Acquired hypothyroidism Well-managed with Synthroid . - Continue Synthroid  175 MCG oral daily. Vitamin D  deficiency Will check vitamin D  level and supplement as needed.    Also encouraged to spend 15 minutes in the sun daily.   Corn or callus Firm area to left plantar surface of the foot, encouraged to use a wart remover to see if there is any improvement if not she may need to see a podiatrist for further evaluation Influenza vaccination declined  Herpes zoster vaccination declined  Other long term (current) drug therapy      Return for keep same next.  Patient was given opportunity to ask questions. Patient verbalized understanding of the plan and was able to repeat key elements of the plan. All questions were answered to their satisfaction.    LILLETTE Gaines Ada, FNP, have reviewed all documentation for this visit. The documentation on 06/25/24 for the exam, diagnosis, procedures, and orders are all accurate and complete.   IF YOU HAVE BEEN REFERRED TO A SPECIALIST, IT MAY TAKE 1-2 WEEKS TO SCHEDULE/PROCESS THE REFERRAL. IF YOU HAVE NOT HEARD FROM US /SPECIALIST IN TWO  WEEKS, PLEASE GIVE US  A CALL AT [phone].

## 2024-06-25 NOTE — Patient Instructions (Incomplete)

## 2024-06-25 NOTE — Assessment & Plan Note (Signed)
 Firm area to left plantar surface of the foot, encouraged to use a wart remover to see if there is any improvement if not she may need to see a podiatrist for further evaluation

## 2024-06-25 NOTE — Assessment & Plan Note (Addendum)
 Will check vitamin D  level and supplement as needed.    Also encouraged to spend 15 minutes in the sun daily.

## 2024-06-25 NOTE — Progress Notes (Deleted)
 PATIENT: Dana Martin DOB: May 21, 1966  REASON FOR VISIT: follow up HISTORY FROM: patient  No chief complaint on file.    HISTORY OF PRESENT ILLNESS:  06/25/24 ALL:  Dana Martin returns for follow up for OSA on CPAP. She was last seen 01/2024 and having difficulty adjusting to therapy. Since,   01/16/2024 ALL:  Dana Martin is a 58 y.o. female here today for follow up for OSA on CPAP. She was seen in consult with Dr Martin 08/2023 for concerns of snoring, excessive daytime sleepiness, and non restorative sleep. HST 09/2023 showed severe obstructive sleep apnea with a total AHI of 30.3/hour and O2 nadir of 55.4% with significant time below or at 88% saturation of over 90 minutes for the study, indicating nocturnal hypoxemia. AutoPAP advised.    Since, she reports not being able to use her machine much at all. She has had significant back pain preventing restful sleep. She is working with PT and receiving steroid injection to help manage pain. She is considering surgical options. She reports significant dry mouth. She has tried a nasal pillow but is a mouth breather and switched to a modified FFM. She tells me that her humidity setting on the machine is disabled and she is not able to adjust. She has used Biotene and is keeping water at bedside. She endorses desire to continue working on compliance.    HISTORY: (copied from Dr Martin previous note)  Dear Gaines,     I saw your patient, Dana Martin, upon your kind request in my sleep clinic today for initial consultation of her sleep disorder, in particular, concern for underlying obstructive sleep apnea.  The patient is unaccompanied today.  As you know, Dana Martin is a 58 year old female with an underlying medical history of rec. pancreatitis, smoking, COPD, hearing loss, reflux disease, hypothyroidism, PVCs, obesity, who reports snoring and excessive daytime somnolence, difficulty falling asleep at night and nonrestorative sleep.  Her  Epworth sleepiness score is 6 out of 24, fatigue severity score is 45 out of 63.  She reports oxygen desaturations while asleep during her recent hospitalization.  She was hospitalized in December for pancreatitis.  She drinks alcohol occasionally, none since her last bout of pancreatitis.  She smokes about half a pack per day and is working on smoking cessation.  She drinks quite a bit of caffeine in the form of coffee, 3 to 4 cups/day, as late as 5 PM and half a serving to 1 serving of soda per day.  She works in engineering geologist with variable hours.  Bedtime is generally between 11 and 1 AM and rise time between 7 and 7:30 AM.  She does have a TV on in her bedroom and it tends to stay on all night.  She has occasional morning headaches, does not typically take any medication for this.  Her sister has sleep apnea.  Patient lives with her husband, they have a small dog in the household, the dog typically sleeps on the bed with them.   REVIEW OF SYSTEMS: Out of a complete 14 system review of symptoms, the patient complains only of the following symptoms, back pain, dry mouth and all other reviewed systems are negative.  ESS: 5/24  ALLERGIES: Allergies  Allergen Reactions   Fish Allergy Hives   Gadolinium Derivatives Hives, Itching and Rash    Pt stated she felt itching and burning in her throat immediately following the injection.   Multihance  [Gadobenate] Hives, Itching and Rash  Itching and burning in her throat. Hives and itching of her skin.   Omnipaque  [Iohexol ] Hives   Other Nausea And Vomiting    ALL NARCOTICS   Peanut-Containing Drug Products Hives   Shellfish Allergy Hives   Codeine Nausea And Vomiting   Hydrocodone -Acetaminophen  Other (See Comments)   Oxycodone  Nausea And Vomiting   Tramadol      headaches    HOME MEDICATIONS: Outpatient Medications Prior to Visit  Medication Sig Dispense Refill   acyclovir  (ZOVIRAX ) 400 MG tablet Take 1 tablet (400 mg total) by mouth 4 (four) times  daily. (Patient not taking: Reported on 01/16/2024) 50 tablet 0   albuterol  (VENTOLIN  HFA) 108 (90 Base) MCG/ACT inhaler INHALE 2 PUFFS BY MOUTH EVERY 4 TO 6 HOURS AS NEEDED (Patient not taking: Reported on 01/16/2024)     atorvastatin  (LIPITOR) 10 MG tablet Take 1 tablet (10 mg total) by mouth daily. 30 tablet 3   atorvastatin  (LIPITOR) 10 MG tablet Take 1 tablet by mouth daily.     Cholecalciferol (VITAMIN D3) 25 MCG (1000 UT) CAPS Take 2 capsules by mouth daily. daily     diazepam (VALIUM) 2 MG tablet Take 2 mg by mouth. (Patient not taking: Reported on 01/16/2024)     diphenhydrAMINE  (BENADRYL ) 25 MG tablet Take 25-50 mg by mouth daily as needed for itching (allergic reaction).      famotidine (PEPCID) 10 MG tablet Take 10 mg by mouth daily.     gabapentin  (NEURONTIN ) 100 MG capsule Take 1 capsule (100 mg total) by mouth at bedtime. 30 capsule 2   ibuprofen  (ADVIL ) 200 MG tablet Take 400-800 mg by mouth every 6 (six) hours as needed for headache or moderate pain.     ipratropium (ATROVENT) 0.03 % nasal spray Place 2 sprays into both nostrils 3 (three) times daily. (Patient not taking: Reported on 01/16/2024)     levocetirizine (XYZAL ) 5 MG tablet Take 5 mg by mouth at bedtime.     meloxicam (MOBIC) 15 MG tablet Take 1 tablet by mouth daily. (Patient not taking: Reported on 01/16/2024)     Polyethyl Glycol-Propyl Glycol 0.4-0.3 % SOLN Place 1 drop into both eyes 5 (five) times daily as needed (dry eyes/irriation).     predniSONE  (STERAPRED UNI-PAK 21 TAB) 10 MG (21) TBPK tablet Take as directed (Patient not taking: Reported on 01/16/2024) 21 tablet 0   SYNTHROID  175 MCG tablet TAKE 1 TABLET BY MOUTH EVERY DAY 90 tablet 1   tiZANidine (ZANAFLEX) 2 MG tablet Take 1 tablet by mouth every 8 (eight) hours. (Patient not taking: Reported on 01/16/2024)     No facility-administered medications prior to visit.    PAST MEDICAL HISTORY: Past Medical History:  Diagnosis Date   Acute left-sided low back pain  with left-sided sciatica 08/08/2023   Acute pancreatitis 12/06/2013   Adverse food reaction 09/04/2019   Angio-edema    COPD (chronic obstructive pulmonary disease) (HCC)    denies SOB with daily activities   Epistaxis, recurrent 10/07/2022   Full dentures    GERD (gastroesophageal reflux disease)    History of pancreatitis    Hypothyroidism    Hypoxia 07/26/2023   Nasal sinus congestion 05/08/2014   with sinus drainage   Other fatigue 08/08/2023   Pancreatitis    PVC (premature ventricular contraction) 07/07/2017   Stress incontinence    Symptomatic cholelithiasis 05/2014   Urticaria    Varicose veins of bilateral lower extremities with pain    Vitamin B12 deficiency 06/26/2019   Wears  dentures    upper and lower   Wears glasses     PAST SURGICAL HISTORY: Past Surgical History:  Procedure Laterality Date   BREAST BIOPSY Right pt unsure   benign   BREAST EXCISIONAL BIOPSY Left pt unsure   benign   BREAST LUMPECTOMY     CHOLECYSTECTOMY N/A 05/13/2014   Procedure: LAPAROSCOPIC CHOLECYSTECTOMY WITH INTRAOPERATIVE CHOLANGIOGRAM;  Surgeon: Vicenta Poli, MD;  Location: [facility];  Service: General;  Laterality: N/A;   ESOPHAGOGASTRODUODENOSCOPY (EGD) WITH PROPOFOL  N/A 08/23/2019   Procedure: ESOPHAGOGASTRODUODENOSCOPY (EGD) WITH PROPOFOL ;  Surgeon: Rollin Dover, MD;  Location: [facility];  Service: Endoscopy;  Laterality: N/A;   EUS N/A 02/19/2014   Procedure: ESOPHAGEAL ENDOSCOPIC ULTRASOUND (EUS) RADIAL;  Surgeon: Elsie Cree, MD;  Location: [facility];  Service: Endoscopy;  Laterality: N/A;   TUBAL LIGATION     UPPER ESOPHAGEAL ENDOSCOPIC ULTRASOUND (EUS) N/A 08/23/2019   Procedure: UPPER ESOPHAGEAL ENDOSCOPIC ULTRASOUND (EUS);  Surgeon: Rollin Dover, MD;  Location: [facility];  Service: Endoscopy;  Laterality: N/A;   VAGINAL HYSTERECTOMY  11/13/2001   transvaginal     FAMILY HISTORY: Family History  Problem Relation Age of Onset   Hypertension  Mother    Atrial fibrillation Mother    Heart attack Mother    Hypertension Sister    Hypertension Brother    Hypertension Daughter    Lung cancer Brother    Diabetes Sister     SOCIAL HISTORY: Social History   Socioeconomic History   Marital status: Married    Spouse name: Not on file   Number of children: Not on file   Years of education: Not on file   Highest education level: 12th grade  Occupational History   Not on file  Tobacco Use   Smoking status: Every Day    Current packs/day: 1.00    Average packs/day: 1 pack/day for 25.0 years (25.0 ttl pk-yrs)    Types: Cigarettes   Smokeless tobacco: Never   Tobacco comments:    quit for six years, started back 09-2018 - currently on wellbutrin  for smoking cessation. 12-09-21 smoking 1/2 pack/day, 06/13/22 - down to 5-6 cigs/day, using patches 06/21/2023, down to 2 cigarettes/day  Substance and Sexual Activity   Alcohol use: Yes   Drug use: No   Sexual activity: Yes  Other Topics Concern   Not on file  Social History Narrative   Not on file   Social Drivers of Health   Financial Resource Strain: Low Risk  (06/21/2023)   Overall Financial Resource Strain (CARDIA)    Difficulty of Paying Living Expenses: Not very hard  Food Insecurity: No Food Insecurity (07/31/2023)   Hunger Vital Sign    Worried About Running Out of Food in the Last Year: Never true    Ran Out of Food in the Last Year: Never true  Transportation Needs: No Transportation Needs (07/31/2023)   PRAPARE - Administrator, Civil Service (Medical): No    Lack of Transportation (Non-Medical): No  Physical Activity: Unknown (06/21/2023)   Exercise Vital Sign    Days of Exercise per Week: Patient declined    Minutes of Exercise per Session: Not on file  Stress: Stress Concern Present (06/21/2023)   [employer] of Occupational Health - Occupational Stress Questionnaire    Feeling of Stress : To some extent  Social Connections: Unknown  (06/21/2023)   Social Connection and Isolation Panel    Frequency of Communication with Friends and Family: Once a week  Frequency of Social Gatherings with Friends and Family: Not on file    Attends Religious Services: Patient declined    Active Member of Clubs or Organizations: No    Attends Banker Meetings: Not on file    Marital Status: Married  Intimate Partner Violence: Not At Risk (07/31/2023)   Humiliation, Afraid, Rape, and Kick questionnaire    Fear of Current or Ex-Partner: No    Emotionally Abused: No    Physically Abused: No    Sexually Abused: No     PHYSICAL EXAM  There were no vitals filed for this visit.  There is no height or weight on file to calculate BMI.  Generalized: Well developed, in no acute distress  Cardiology: normal rate and rhythm, no murmur noted Respiratory: clear to auscultation bilaterally  Neurological examination  Mentation: Alert oriented to time, place, history taking. Follows all commands speech and language fluent Cranial nerve II-XII: Pupils were equal round reactive to light. Extraocular movements were full, visual field were full  Motor: The motor testing reveals 5 over 5 strength of all 4 extremities. Good symmetric motor tone is noted throughout.  Gait and station: Gait is normal.    DIAGNOSTIC DATA (LABS, IMAGING, TESTING) - I reviewed patient records, labs, notes, testing and imaging myself where available.      No data to display           Lab Results  Component Value Date   WBC 8.5 12/20/2023   HGB 12.2 12/20/2023   HCT 38.1 12/20/2023   MCV 83 12/20/2023   PLT 286 12/20/2023      Component Value Date/Time   NA 139 12/20/2023 0936   K 4.6 12/20/2023 0936   CL 99 12/20/2023 0936   CO2 26 12/20/2023 0936   GLUCOSE 82 12/20/2023 0936   GLUCOSE 90 12/14/2023 1203   BUN 9 12/20/2023 0936   CREATININE 0.68 12/20/2023 0936   CREATININE 0.60 03/12/2015 1021   CALCIUM  9.4 12/20/2023 0936   PROT 7.1  12/20/2023 0936   ALBUMIN 4.2 12/20/2023 0936   AST 12 12/20/2023 0936   ALT 11 12/20/2023 0936   ALKPHOS 83 12/20/2023 0936   BILITOT <0.2 12/20/2023 0936   GFRNONAA >60 12/14/2023 1203   GFRNONAA >89 03/12/2015 1021   GFRAA 124 12/04/2019 1109   GFRAA >89 03/12/2015 1021   Lab Results  Component Value Date   CHOL 224 (H) 12/20/2023   HDL 64 12/20/2023   LDLCALC 147 (H) 12/20/2023   TRIG 77 12/20/2023   CHOLHDL 3.5 12/20/2023   Lab Results  Component Value Date   HGBA1C 5.8 (H) 12/20/2023   Lab Results  Component Value Date   VITAMINB12 341 12/19/2022   Lab Results  Component Value Date   TSH 4.640 (H) 12/20/2023     ASSESSMENT AND PLAN 58 y.o. year old female  has a past medical history of Acute left-sided low back pain with left-sided sciatica (08/08/2023), Acute pancreatitis (12/06/2013), Adverse food reaction (09/04/2019), Angio-edema, COPD (chronic obstructive pulmonary disease) (HCC), Epistaxis, recurrent (10/07/2022), Full dentures, GERD (gastroesophageal reflux disease), History of pancreatitis, Hypothyroidism, Hypoxia (07/26/2023), Nasal sinus congestion (05/08/2014), Other fatigue (08/08/2023), Pancreatitis, PVC (premature ventricular contraction) (07/07/2017), Stress incontinence, Symptomatic cholelithiasis (05/2014), Urticaria, Varicose veins of bilateral lower extremities with pain, Vitamin B12 deficiency (06/26/2019), Wears dentures, and Wears glasses. here with   No diagnosis found.    Dana Martin is doing well on CPAP therapy. Compliance report reveals sub optimal compliance. She does endorse  willingness to continue using therapy. We have reviewed HST results and discussed possible treatment options. She was encouraged to continue using CPAP nightly and for greater than 4 hours each night. I will ask DME to assess mask options and evaluate proper functioning of humidity settings. Risks of untreated sleep apnea review and education materials provided.  Healthy lifestyle habits encouraged. She will follow up in 3-4 months, sooner if needed. She verbalizes understanding and agreement with this plan.    No orders of the defined types were placed in this encounter.    No orders of the defined types were placed in this encounter.   I personally spent a total of 30 minutes in the care of the patient today including preparing to see the patient, getting/reviewing separately obtained history, performing a medically appropriate exam/evaluation, counseling and educating, placing orders, referring and communicating with other health care professionals, documenting clinical information in the EHR, communicating results, and coordinating care.   Greig Forbes, FNP-C 06/25/2024, 7:49 AM [facility] [address] [place], [place] [address] [phone]

## 2024-06-25 NOTE — Assessment & Plan Note (Addendum)
 Managed with atorvastatin . She reports difficulty with consistent dosing due to irregular work hours. - Continue atorvastatin  10 MG oral daily. - Advised setting a timer or alarm to remember to take atorvastatin , preferably in the evening.

## 2024-06-26 ENCOUNTER — Encounter: Payer: Self-pay | Admitting: Nurse Practitioner

## 2024-06-26 ENCOUNTER — Ambulatory Visit: Payer: Self-pay | Admitting: Nurse Practitioner

## 2024-06-26 DIAGNOSIS — E782 Mixed hyperlipidemia: Secondary | ICD-10-CM

## 2024-06-26 LAB — BMP8+EGFR
BUN/Creatinine Ratio: 23 (ref 12–28)
BUN: 14 mg/dL (ref 8–27)
CO2: 22 mmol/L (ref 20–29)
Calcium: 9 mg/dL (ref 8.7–10.3)
Chloride: 103 mmol/L (ref 96–106)
Creatinine, Ser: 0.6 mg/dL (ref 0.57–1.00)
Glucose: 81 mg/dL (ref 70–99)
Potassium: 4.2 mmol/L (ref 3.5–5.2)
Sodium: 142 mmol/L (ref 134–144)
eGFR: 103 mL/min/1.73 (ref >59)

## 2024-06-26 LAB — LIPID PANEL
Chol/HDL Ratio: 3.4 ratio (ref 0.0–4.4)
Cholesterol, Total: 209 mg/dL — ABNORMAL HIGH (ref 100–199)
HDL: 61 mg/dL (ref >39)
LDL Chol Calc (NIH): 137 mg/dL — ABNORMAL HIGH (ref 0–99)
Triglycerides: 63 mg/dL (ref 0–149)
VLDL Cholesterol Cal: 11 mg/dL (ref 5–40)

## 2024-06-26 LAB — VITAMIN D 25 HYDROXY (VIT D DEFICIENCY, FRACTURES): Vit D, 25-Hydroxy: 18.9 ng/mL — ABNORMAL LOW (ref 30.0–100.0)

## 2024-06-26 LAB — TSH+FREE T4
Free T4: 1.65 ng/dL (ref 0.82–1.77)
TSH: 3.93 u[IU]/mL (ref 0.450–4.500)

## 2024-06-26 MED ORDER — ATORVASTATIN CALCIUM 20 MG PO TABS: 20.0000 mg | ORAL_TABLET | Freq: Every day | ORAL | 1 refills | Status: AC

## 2024-07-01 ENCOUNTER — Telehealth: Payer: Self-pay | Admitting: Family Medicine

## 2024-07-01 NOTE — Telephone Encounter (Signed)
 Pt made Request to cancel appointment, conflict with another appointment

## 2024-07-02 ENCOUNTER — Ambulatory Visit: Admitting: Family Medicine

## 2024-07-02 DIAGNOSIS — G4733 Obstructive sleep apnea (adult) (pediatric): Secondary | ICD-10-CM

## 2024-07-31 ENCOUNTER — Ambulatory Visit (HOSPITAL_COMMUNITY)
Admission: RE | Admit: 2024-07-31 | Discharge: 2024-07-31 | Disposition: A | Discharge status: Home / Self Care | Source: Ambulatory Visit | Attending: Cardiovascular Disease | Admitting: Cardiovascular Disease

## 2024-07-31 DIAGNOSIS — I2510 Atherosclerotic heart disease of native coronary artery without angina pectoris: Secondary | ICD-10-CM | POA: Insufficient documentation

## 2024-07-31 DIAGNOSIS — I2721 Secondary pulmonary arterial hypertension: Secondary | ICD-10-CM | POA: Diagnosis not present

## 2024-08-03 LAB — ECHOCARDIOGRAM COMPLETE
Area-P 1/2: 3.74 cm2
Calc EF: 57.8 %
MV M vel: 5.32 m/s
MV Peak grad: 113.2 mmHg
Radius: 0.4 cm
S' Lateral: 4.1 cm
Single Plane A2C EF: 54.7 %
Single Plane A4C EF: 59.5 %

## 2024-12-23 ENCOUNTER — Encounter: Payer: Self-pay | Admitting: Nurse Practitioner
